# Patient Record
Sex: Male | Born: 1935 | Race: White | Hispanic: No | Marital: Single | Smoking: Former smoker
Health system: Southern US, Community
[De-identification: ages and names within clinical notes are randomized; demographics above are authoritative.]

## PROBLEM LIST (undated history)

## (undated) DIAGNOSIS — N400 Enlarged prostate without lower urinary tract symptoms: Secondary | ICD-10-CM

## (undated) DIAGNOSIS — I10 Essential (primary) hypertension: Secondary | ICD-10-CM

## (undated) DIAGNOSIS — H9192 Unspecified hearing loss, left ear: Secondary | ICD-10-CM

## (undated) DIAGNOSIS — J302 Other seasonal allergic rhinitis: Secondary | ICD-10-CM

## (undated) DIAGNOSIS — E785 Hyperlipidemia, unspecified: Secondary | ICD-10-CM

## (undated) DIAGNOSIS — I6529 Occlusion and stenosis of unspecified carotid artery: Secondary | ICD-10-CM

## (undated) DIAGNOSIS — M1990 Unspecified osteoarthritis, unspecified site: Secondary | ICD-10-CM

## (undated) DIAGNOSIS — I639 Cerebral infarction, unspecified: Secondary | ICD-10-CM

## (undated) HISTORY — DX: Occlusion and stenosis of unspecified carotid artery: I65.29

## (undated) HISTORY — PX: APPENDECTOMY: SHX54

## (undated) HISTORY — DX: Essential (primary) hypertension: I10

## (undated) HISTORY — DX: Hyperlipidemia, unspecified: E78.5

## (undated) HISTORY — DX: Cerebral infarction, unspecified: I63.9

---

## 2003-09-17 ENCOUNTER — Emergency Department (HOSPITAL_COMMUNITY): Admission: AD | Admit: 2003-09-17 | Discharge: 2003-09-17 | Payer: Self-pay | Admitting: Family Medicine

## 2006-10-12 HISTORY — PX: OTHER SURGICAL HISTORY: SHX169

## 2006-10-12 HISTORY — PX: CAROTID ENDARTERECTOMY: SUR193

## 2007-08-25 ENCOUNTER — Ambulatory Visit: Payer: Self-pay | Admitting: *Deleted

## 2007-09-12 ENCOUNTER — Ambulatory Visit: Payer: Self-pay | Admitting: *Deleted

## 2007-09-12 ENCOUNTER — Encounter (INDEPENDENT_AMBULATORY_CARE_PROVIDER_SITE_OTHER): Payer: Self-pay | Admitting: *Deleted

## 2007-09-12 ENCOUNTER — Inpatient Hospital Stay (HOSPITAL_COMMUNITY): Admission: RE | Admit: 2007-09-12 | Discharge: 2007-09-13 | Payer: Self-pay | Admitting: *Deleted

## 2007-10-20 ENCOUNTER — Ambulatory Visit: Payer: Self-pay | Admitting: *Deleted

## 2008-03-22 ENCOUNTER — Ambulatory Visit: Payer: Self-pay | Admitting: *Deleted

## 2009-04-04 ENCOUNTER — Ambulatory Visit: Payer: Self-pay | Admitting: *Deleted

## 2009-09-12 ENCOUNTER — Ambulatory Visit: Payer: Self-pay | Admitting: Vascular Surgery

## 2010-03-21 ENCOUNTER — Ambulatory Visit: Payer: Self-pay | Admitting: Vascular Surgery

## 2010-09-26 ENCOUNTER — Ambulatory Visit: Payer: Self-pay | Admitting: Vascular Surgery

## 2011-01-13 ENCOUNTER — Encounter: Payer: Self-pay | Admitting: Family Medicine

## 2011-01-13 DIAGNOSIS — Z9889 Other specified postprocedural states: Secondary | ICD-10-CM

## 2011-01-13 DIAGNOSIS — IMO0002 Reserved for concepts with insufficient information to code with codable children: Secondary | ICD-10-CM | POA: Insufficient documentation

## 2011-01-13 DIAGNOSIS — I2510 Atherosclerotic heart disease of native coronary artery without angina pectoris: Secondary | ICD-10-CM

## 2011-01-13 DIAGNOSIS — N400 Enlarged prostate without lower urinary tract symptoms: Secondary | ICD-10-CM

## 2011-01-13 DIAGNOSIS — E1165 Type 2 diabetes mellitus with hyperglycemia: Secondary | ICD-10-CM | POA: Insufficient documentation

## 2011-01-13 DIAGNOSIS — IMO0001 Reserved for inherently not codable concepts without codable children: Secondary | ICD-10-CM

## 2011-01-13 DIAGNOSIS — F17201 Nicotine dependence, unspecified, in remission: Secondary | ICD-10-CM

## 2011-01-13 DIAGNOSIS — I10 Essential (primary) hypertension: Secondary | ICD-10-CM

## 2011-02-24 NOTE — Op Note (Signed)
[name], [name]               ACCOUNT NO.:  [id number]   MEDICAL RECORD NO.:  [id number]          PATIENT TYPE:  INP   LOCATION:  [id number]                         FACILITY:  [facility]   PHYSICIAN:  Dorothea Glassman, M.D.    DATE OF BIRTH:  21-May-1931   DATE OF PROCEDURE:  09/12/2007  DATE OF DISCHARGE:                               OPERATIVE REPORT   PREOPERATIVE DIAGNOSIS:  Severe right internal carotid artery stenosis.   POSTOPERATIVE DIAGNOSIS:  Severe right internal carotid artery stenosis.   PROCEDURE:  Right carotid endarterectomy, Dacron patch angioplasty.   SURGEON:  P Cameron Sprang, MD   ASSISTANT:  Jacinta Shoe, P.A.   ANESTHETIC:  General endotracheal.   ANESTHESIOLOGIST:  Dr. Orene Desanctis.   CLINICAL NOTE:  [name] is a 77 year old male referred to the [facility]  office with a right carotid bruit and Doppler revealing severe right  internal carotid artery stenosis.  This was picked up initially on  physical examination by a primary care physician.  The patient was seen  in consultation, recommended he undergo right carotid endarterectomy for  reduction of stroke risk.  The patient consented for surgery.  Brought  to the operating room at this time for right carotid endarterectomy.  Potential risks of the operative procedure explained to the patient in  detail with major morbidity mortality 1-2% to include but not limited to  MI, CVA, cranial nerve injury and death.   OPERATIVE PROCEDURE:  The patient brought to the operating room in  stable condition.  Placed in supine position.  General endotracheal  anesthesia induced.  Foley catheter arterial line in place.  Right neck  prepped and draped in sterile fashion.   Curvilinear skin incision made along the anterior border right  sternomastoid muscle.  Dissection carried down through subcutaneous  tissue with electrocautery.  Deep dissection carried down through the  platysma.  Dissection carried anterior to the  sternocleidomastoid muscle  to expose the carotid bifurcation.  Facial vein ligated with 2-0 silk  and divided.  The common carotid artery mobilized down the omohyoid  muscle and encircled proximally with vessel loop.  The superior thyroid  external carotid were freed and encircled with vessel loops.  The  internal carotid artery followed distally up to the posterior belly of  the digastric muscle and encircled with vessel.   Evaluation of the bifurcation revealed calcified plaque extending into  the origin of the right internal carotid artery.   The tenth and twelfth cranial nerves were identified and preserved.   The patient administered 7000 units heparin intravenously.  The carotid  vessels controlled with clamps.  Longitudinal arteriotomy made in the  distal common carotid artery.  The arteriotomy extended across carotid  bulb and up into the internal carotid artery.  There was a calcified  plaque present in the right carotid bulb extending into the right  internal carotid artery with a high-grade stenosis.  The shunt was  inserted.   An endarterectomy elevator used to remove the plaque.  The  endarterectomy carried down to the common carotid artery where plaque  was divided  transversely with Potts scissors.  Plaque then raised up in  the bulb of the superior thyroid and external carotid were  endarterectomized using an eversion technique.  The distal internal  carotid artery plaque feathered out well.  Fragments of plaque were  removed with fine forceps.  Site irrigated with heparin saline and  dextran solution.   A patch angioplasty endarterectomy site carried out with a Finesse  Dacron patch using running 6-0 Prolene suture.  At completion of the  patch angioplasty, shunt was removed.  All vessels well flushed.  Clamps  removed directing initial antegrade flow up the external carotid artery.  Following this internal carotid was released.  There was an excellent  pulse and  Doppler signal in the distal internal carotid artery.  Adequate hemostasis obtained.  The patient administered 50 mg protamine  intravenously.   Sternomastoid fascia closed running 2-0 Vicryl suture.  Platysma closed  running 3-0 Vicryl suture.  Skin closed 4-0 Monocryl.  Dermabond  applied.   Anesthesia reversed in the operating room.  The patient awakened  readily.  Moved all extremities to command.  Transferred to recovery  room in stable condition.      Dorothea Glassman, M.D.  Electronically Signed     PGH/MEDQ  D:  09/12/2007  T:  09/13/2007  Job:  [id number]

## 2011-02-24 NOTE — Assessment & Plan Note (Signed)
OFFICE VISIT   [name], [name]  DOB:  28-Mar-1931                                       03/21/2010  [id number]   Date of surgery was 09/12/2007 right carotid endarterectomy with Dacron  patch angioplasty closure by Dr. Amedeo Plenty.  The patient is a very pleasant  77 year old gentleman with diabetes, hypertension and  hypercholesterolemia.  He has undergone a previous right carotid  endarterectomy and returns for surveillance.   PAST MEDICAL HISTORY:  As above.   SOCIAL HISTORY:  He is married.  He has four children.  He has a remote  history of tobacco use with discontinuation 20 years ago.   FAMILY HISTORY:  Is significant for cardiac and vascular disease in his  father.   REVIEW OF SYSTEMS:  Was entirely negative.   PHYSICAL FINDINGS:  Revealed a very pleasant elderly slightly obese  gentleman in no apparent distress.  Height was 5 feet 5-1/2 inches, 183  pounds.  Heart rate was 79, blood pressure 179/90, O2 saturation was  95%.  HEENT:  PERRLA, EOMI.  Conjunctivae normal.  Mucous membranes were  pink and moist.  Neck demonstrated a full range of motion.  The trachea  was midline.  I did appreciate a bruit on the right side.  Lungs were  clear to auscultation.  Cardiac exam revealed a regular rate and rhythm.  The abdomen was soft, nontender.  Musculoskeletal exam demonstrated no  major deformities.  Neurological exam was nonfocal.  There were no  ulcers or rashes on the skin.   LABORATORY RESULTS:  He underwent carotid duplex exam today.  He did  have a patent right carotid endarterectomy site with what appeared to be  a significant stenosis of the right distal CCA near the bifurcation  which velocities are unchanged from the previous study performed on  09/12/2009.  There was no hemodynamically significant stenosis in the  left proximal ICA.  No significant changes in velocities were noted in  comparison to the previous exam.   ASSESSMENT AND  PLAN:  The patient is a very pleasant gentleman who has  undergone a right carotid endarterectomy.  He remains stable in his  studies.  We will continue to follow him on a 6 months' basis.   Chad Cordial, PA   Rosetta Posner, M.D.  Electronically Signed   KEL/MEDQ  D:  03/21/2010  T:  03/21/2010  Job:  [id number]

## 2011-02-24 NOTE — Procedures (Signed)
CAROTID DUPLEX EXAM   INDICATION:  Followup carotid artery disease.   HISTORY:  Diabetes:  Yes.  Cardiac:  No.  Hypertension:  Yes.  Smoking:  Previous.  Previous Surgery:  Right CEA 09/12/2007 by Dr. Amedeo Plenty.  CV History:  No.  Amaurosis Fugax No, Paresthesias No, Hemiparesis No                                       RIGHT             LEFT  Brachial systolic pressure:         166               160  Brachial Doppler waveforms:         WNL               WNL  Vertebral direction of flow:        Antegrade         Antegrade  DUPLEX VELOCITIES (cm/sec)  CCA peak systolic                   M=72, D=194       70  ECA peak systolic                   216               [id number]  ICA peak systolic                   248               [id number]  ICA end diastolic                   52                54  PLAQUE MORPHOLOGY:                  Intimal hyperplasia                 Calcific  PLAQUE AMOUNT:                      Moderate          Moderate  PLAQUE LOCATION:                    Proximal patch (distal CCA)         ICA/ECA   IMPRESSION:  1. Right ICA shows no evidence of restenosis, however, velocities are      suggestive of 60-79% stenosis.  2. Right distal CCA at proximal patch shows evidence of intimal      hyperplasia/homogenous plaque with increased velocities of 194      cm/s.  3. Left ICA shows evidence of 40-59% stenosis.  4. Bilateral ECA stenosis.  5. Dr. Amedeo Plenty was informed of the results and stated to put on 6 month      protocol.   ___________________________________________  P. Drucie Opitz, M.D.   AS/MEDQ  D:  04/04/2009  T:  04/04/2009  Job:  [id number]

## 2011-02-24 NOTE — Procedures (Signed)
CAROTID DUPLEX EXAM   INDICATION:  Carotid disease.   HISTORY:  Diabetes:  Yes.  Cardiac:  No.  Hypertension:  Yes.  Smoking:  Previous.  Previous Surgery:  Right carotid endarterectomy on 07/13/2007.  CV History:  Currently asymptomatic.  Amaurosis Fugax No, Paresthesias No, Hemiparesis No                                       RIGHT             LEFT  Brachial systolic pressure:         158               164  Brachial Doppler waveforms:         Normal            Normal  Vertebral direction of flow:        Antegrade         Antegrade  DUPLEX VELOCITIES (cm/sec)  CCA peak systolic                   M=38/D=307        57  ECA peak systolic                   190               [id number]  ICA peak systolic                   285               86  ICA end diastolic                   46                25  PLAQUE MORPHOLOGY:                  Homogeneous       Mixed  PLAQUE AMOUNT:                      Moderate          Mild  PLAQUE LOCATION:                    Distal CCA        ICA / ECA   IMPRESSION:  1. Patent right carotid endarterectomy site with a significant      stenosis of the right distal common carotid artery near the      bifurcation.  2. No hemodynamically significant stenosis of the left proximal      internal carotid artery.  3. No significant change in the velocities when compared to the      previous exam on 09/12/2009.   ___________________________________________  Rosetta Posner, M.D.   CH/MEDQ  D:  03/21/2010  T:  03/21/2010  Job:  [id number]

## 2011-02-24 NOTE — Assessment & Plan Note (Signed)
OFFICE VISIT   Maurice, [name]  DOB:  [birth year]                                       10/20/2007  [id number]   The patient underwent a right carotid endarterectomy for severe stenosis  on 09/12/2007 at [facility].  This was an uneventful operative  procedure.  He was discharged home in good condition.  He has returned  to normal activity now.  No complaints at this time.   BP 161/92, pulse 82 per minute and regular, respirations 18 per minute,  O2 saturation 97%.  Right neck incision is healing unremarkably.  No  carotid bruits audible.  Cranial nerves intact.  Grip strength equal  bilaterally.   The patient is doing quite well following his surgery.  He will remain  on aspirin 81 mg daily.  Plan to follow up with him again in 6 months  and repeat carotid Doppler evaluation.   Dorothea Glassman, M.D.  Electronically Signed   PGH/MEDQ  D:  10/20/2007  T:  10/21/2007  Job:  [id number]   cc:   [name], MD

## 2011-02-24 NOTE — Procedures (Signed)
CAROTID DUPLEX EXAM   INDICATION:  Followup right carotid endarterectomy.   HISTORY:  Diabetes:  No.  Cardiac:  No.  Hypertension:  No.  Smoking:  Previous.  Previous Surgery:  Right carotid endarterectomy on 07/13/2007.  CV History:  Currently asymptomatic.  Amaurosis Fugax No, Paresthesias No, Hemiparesis No                                       RIGHT             LEFT  Brachial systolic pressure:         136               144  Brachial Doppler waveforms:         Normal            Normal  Vertebral direction of flow:        Antegrade         Antegrade  DUPLEX VELOCITIES (cm/sec)  CCA peak systolic                   M=36/D=303        59  ECA peak systolic                   79                0000000  ICA peak systolic                   270               68  ICA end diastolic                   50                20  PLAQUE MORPHOLOGY:                  Homogeneous       Heterogeneous  PLAQUE AMOUNT:                      Moderate          Mild  PLAQUE LOCATION:                    Distal CCA        ICA / ECA / CCA   IMPRESSION:  1. Patent right carotid endarterectomy site with a significant      stenosis of the right distal common carotid artery near the      bifurcation.  Elevated velocities noted in the right proximal      internal carotid artery which appears to be due to the right distal      common carotid artery stenosis.  2. No hemodynamically significant stenosis of the left proximal      internal carotid artery with mild plaque formations as described      above.  3. No significant change in Doppler velocities noted when compared to      the previous exam on 03/21/2010.     ___________________________________________  Rosetta Posner, M.D.   CH/MEDQ  D:  09/26/2010  T:  09/26/2010  Job:  [id number]

## 2011-02-24 NOTE — Discharge Summary (Signed)
NAMESELBY, BRUECK               ACCOUNT NO.:  [id number]   MEDICAL RECORD NO.:  [id number]          PATIENT TYPE:  INP   LOCATION:  [id number]                         FACILITY:  [facility]   PHYSICIAN:  Dorothea Glassman, M.D.    DATE OF BIRTH:  01-16-1931   DATE OF ADMISSION:  09/12/2007  DATE OF DISCHARGE:  09/13/2007                               DISCHARGE SUMMARY   ADMISSION DIAGNOSIS:  Severe right internal carotid artery stenosis,  asymptomatic.   SECONDARY DIAGNOSES:  1. Severe right internal carotid artery stenosis, asymptomatic, status      post right carotid endarterectomy.  2. Mild postoperative hypokalemia, supplemented.  3. Hypertension.  4. Hyperlipidemia.  5. Diabetes mellitus type 2.  6. Benign prostatic hyperplasia.   ALLERGIES:  NO KNOWN DRUG ALLERGIES.   SOCIAL HISTORY:  History of tobacco use but has since quit.   PAST SURGICAL HISTORY:  Appendectomy as a child.   PROCEDURES:  On September 12, 2007, right carotid endarterectomy with  Dacron patch angioplasty by Dr. Dorothea Glassman.   BRIEF HISTORY:  Mr. Barrozo is a 77 year old Caucasian male who was seen  by Dr. Dorothea Glassman for evaluation and management of a normal right  carotid Doppler.  He was noted to have asymptomatic right carotid bruit  by Dr. Margaretmary Eddy. The carotid Doppler evaluation was carried out at  [facility] vascular lab revealing severe right internal carotid artery  stenosis with proximal right internal carotid velocities of 495/210  cm/sec., consistent with severe stenosis.  He had no history of stroke  or TIAs.  Dr. Amedeo Plenty felt due to severity of his carotid artery stenosis,  he should undergo elective right carotid endarterectomy to reduce his  risk for future stroke.   HOSPITAL COURSE:  Mr. Weigandt was elective admitted to [facility] September 12, 2007.  He underwent the previously mentioned  procedure.  He was extubated neurologically intact and after his first  day in the recovery  unit was transferred to the stepdown unit 3300 where  it is anticipated he will remain until discharge.  The morning of  postoperative day 1, vitals were stable.  Did have a low-grade  temperature around 100 overnight which was felt most likely secondary to  atelectasis.  Pulmonary toilet with incentive spirometry was encouraged.  Temperature during morning rounds was down to 99, blood pressure 141/68,  heart rate in the 80s and is sinus rhythm, oxygen saturation 94% on room  air.  Labs showed a white count of 9.1, hemoglobin 13.8, hematocrit  40.2, platelet count 167.  Sodium 138, potassium 3.8 which was  supplemented, BUN of 11, creatinine 1.14 and blood glucose 154.  He is  ambulating and voiding without difficulty.  He denied dysphagia, nausea,  vomiting, and was comfortable.   Physical examination showed his heart was regular rate and rhythm.  Lungs were clear except diminished at the bases.  Abdominal exam was  benign with decreased bowel sounds. Neurologically he was intact.  His  tongue was midline and was moving all extremities times 4.  His neck  incision was clean  and dry without evidence of hematoma.   Diabetes and diabetic and hypertension medications were resumed.  Diet  was advanced.  Currently he is doing well, remains in stable condition,  anticipate that he will be discharged home later this morning or early  afternoon postoperative day 1, September 13, 2007.   DISCHARGE MEDICATIONS:  1. Oxycodone 5 mg 1 to 2 tablets p.o. q.4h. p.r.n. pain.  2. Norvasc 5 mg p.o. daily.  3. Simvastatin 40 mg p.o. daily.  4. Atenolol 50 mg p.o. daily.  5. Lisinopril 40 mg p.o. daily.  6. Januvia 100 mg (reports his family doctor has him on twice daily      dosing).  7. Aspirin 81 mg 2 tablets p.o. daily.   DISCHARGE INSTRUCTIONS:  He should continue diet-appropriate diet, may  shower, clean incision gently with soap and water.  He should call if he  develops fever greater than 101,  redness or draining from his incision  site or changes in his neurological status.  He should avoid driving or  heavy lifting for the next 2 weeks.  Increase his activity as tolerated.  Continue daily walking exercises.  He will follow up with Dr. Amedeo Plenty with  the vascular main specialist office in approximately 2 weeks. Our office  will contact him regarding specific appointment date and time.      Jacinta Shoe, P.A.      Dorothea Glassman, M.D.  Electronically Signed    AWZ/MEDQ  D:  09/13/2007  T:  09/13/2007  Job:  [id number]   cc:   [place] T. Pedro Earls, MD

## 2011-02-24 NOTE — Consult Note (Signed)
VASCULAR SURGERY CONSULTATION   [name], [name]  DOB:  1931-05-29                                       08/25/2007  CHART#:[id number]   REASON FOR CONSULTATION:  Severe right internal carotid artery stenosis.   HISTORY:  The patient is a 77 year old male referred for evaluation and  management of abnormal right carotid Doppler.  He was noted to have an  asymptomatic right carotid bruit by Dr. Margaretmary [name], and carotid Doppler  evaluation carried out at [facility] reveals a severe right  internal carotid artery stenosis.  Proximal right internal carotid  artery reveals velocities of 495/200 cm/sec.  This is consistent with a  severe stenosis.   The patient has no history of stroke.  Denies sensory, motor, visual,  speech or gait abnormalities.  No syncope or presyncope.  No dizziness.   Risk factors for cardiovascular disease include type 2 diabetes,  hypertension, hyperlipidemia.   MEDICATIONS:  1. Norvasc 5 mg daily.  2. Simvastatin 40 mg daily.  3. Atenolol 50 mg daily.  4. Lisinopril 40 mg daily.  5. Januvia 100 mg daily.  6. Aspirin 81 mg daily.   PAST MEDICAL HISTORY:  1. Type 2 diabetes.  2. Hypertension.  3. Hyperlipidemia.  4. BPH.   FAMILY HISTORY:  Mother deceased at age 38 with a history of cancer.  Father deceased at age 60 of complications of COPD.  He has 1 living  sister with a history of osteoarthritis and limited mobility.   SOCIAL HISTORY:  The patient is married, 2 grown children.  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  Does not  smoke.  Discontinued tobacco use about 15 years ago.  No history of  alcohol abuse.   ALLERGIES:  None known.   REVIEW OF SYSTEMS:  (Refer to patient encounter form).  Positive  findings include some loss of appetite recently although no significant  weight loss.  Denies fever or chills.  No chest pain or shortness of  breath.  No cough or sputum production.  No  change in bowel habits,  denies abdominal pain, nausea or vomiting.  No dysuria or frequency.  Denies lower extremity pain.  He does have joint discomfort, muscle  aches.  Denies depression or anxiety.  No changes in vision or hearing.  No bleeding problems.   PHYSICAL EXAMINATION:  GENERALLY:  Well-appearing 77 year old male.  No  acute distress.  No pallor, cyanosis or jaundice.  Alert and oriented.  VITAL SIGNS:  BP 151/85 in the right arm, 143/83 left arm, pulse 73 per  minute, respirations 18 per minute, 02 saturation 98%.  HEENT:  Mouth and throat are clear.  Normocephalic.  Extraocular  movements are intact.  NECK:  Supple, no thyromegaly or adenopathy.  CARDIOVASCULAR:  Right carotid bruit.  No left carotid bruit.  Normal  heart sounds.  Regular rate and rhythm.  No murmurs, rubs, or gallops.  CHEST:  Equal air entry bilaterally.  No rales or rhonchi.  Normal  percussion.  No chest deformity.  ABDOMEN:  Mild to moderate obesity.  No masses or organomegaly.  Nontender.  No guarding or rebound.  Bowel sounds are active, no bruits.  EXTREMITIES:  Normal range of motion in all 4 extremities.  No joint  deformity.  No ankle swelling.  NEUROLOGIC:  The patient is  alert and oriented.  Moves all extremities  to command normally.  Strength equal bilaterally.  Cranial nerves are  intact.  Normal sensation.  Reflexes are 1+.  Speech is normal.  SKIN:  No rashes or ulceration evident.   IMPRESSION:  1. Asymptomatic severe right internal carotid artery stenosis.  2. Type 2 diabetes.  3. Hypertension.  4. Hyperlipidemia.  5. Benign prostatic hypertrophy.   MEDICAL DECISION MAKING:  The patient has a severe right internal  carotid artery stenosis which poses an increased risk of stroke.  I have  recommended right carotid endarterectomy for reduction of stroke risk.  Details of the operative procedure were reviewed with the patient  including major morbidity of 1-2% to include but not  limited to MI, CVA,  cranial nerve injury and death.  Recommended at this time to increase  aspirin to 325 mg daily.  Surgery scheduled for 09/12/2007 at [facility].   Other chronic medical conditions appear well controlled at this time  including hypertension and hyperlipidemia.  No further changes in  management.   Dorothea Glassman, M.D.  Electronically Signed  PGH/MEDQ  D:  08/25/2007  T:  08/26/2007  Job:  [id number]

## 2011-02-24 NOTE — Procedures (Signed)
CAROTID DUPLEX EXAM   INDICATION:  Followup, right carotid endarterectomy.   HISTORY:  Diabetes:  Yes.  Cardiac:  No.  Hypertension:  No.  Smoking:  Previous.  Previous Surgery:  Right carotid endarterectomy on 09/12/07.  CV History:  No.  Amaurosis Fugax No, Paresthesias No, Hemiparesis No                                       RIGHT             LEFT  Brachial systolic pressure:         120               120  Brachial Doppler waveforms:         Normal            Normal  Vertebral direction of flow:        Antegrade         Antegrade  DUPLEX VELOCITIES (cm/sec)  CCA peak systolic                   63                75  ECA peak systolic                   117               [id number]  ICA peak systolic                   69                [id number]  ICA end diastolic                   18                44  PLAQUE MORPHOLOGY:                  None              Heterogenous  PLAQUE AMOUNT:                      None              Mild-to-moderate  PLAQUE LOCATION:                    None              ICA/ECA   IMPRESSION:  1. Patent right carotid endarterectomy site noted with no evidence of      stenosis.  2. Intimal thickening noted in the right distal common carotid artery.  3. 40-59% stenosis of the left internal carotid artery.      ___________________________________________  P. Drucie Opitz, M.D.   CH/MEDQ  D:  03/22/2008  T:  03/22/2008  Job:  [id number]

## 2011-02-24 NOTE — Procedures (Signed)
CAROTID DUPLEX EXAM   INDICATION:  Carotid artery disease.   HISTORY:  Diabetes:  Yes.  Cardiac:  No.  Hypertension:  Yes.  Smoking:  Previous.  Previous Surgery:  Right carotid endarterectomy on 09/12/2007.  CV History:  Currently asymptomatic.  Amaurosis Fugax No, Paresthesias No, Hemiparesis No                                       RIGHT             LEFT  Brachial systolic pressure:         142               154  Brachial Doppler waveforms:         Normal            Normal  Vertebral direction of flow:        Antegrade         Antegrade  DUPLEX VELOCITIES (cm/sec)  CCA peak systolic                   M=40/D=300        58  ECA peak systolic                   170               [id number]  ICA peak systolic                   281               93  ICA end diastolic                   52                27  PLAQUE MORPHOLOGY:                  Intimal hyperplasia versus soft  plaque                              Calcific  PLAQUE AMOUNT:                      Moderate          Mild / moderate  PLAQUE LOCATION:                    Proximal patch / distal CCA         ICA / ECA   IMPRESSION:  1. Doppler velocities suggest a 60%-79% stenosis of the right internal      carotid artery, however, this is most likely a result of the      increased velocities of the right distal common carotid artery.  2. 1%-39% stenosis of the left internal carotid artery.  3. Increased velocity of right distal common carotid artery noted when      compared to the previous exam on 04/04/2009 with the left internal      carotid artery velocities appearing less than previously recorded.   ___________________________________________  Rosetta Posner, M.D.   CH/MEDQ  D:  09/12/2009  T:  09/12/2009  Job:  [id number]

## 2011-07-20 LAB — BASIC METABOLIC PANEL
CO2: 26
Calcium: 8.7
Creatinine, Ser: 1.14
GFR calc Af Amer: >60
GFR calc non Af Amer: >60
Potassium: 3.8
Sodium: 138

## 2011-07-20 LAB — CBC
HCT: 40.2
MCHC: 34.3
MCV: 91
RBC: 4.42
WBC: 9.1

## 2011-07-21 LAB — URINALYSIS, ROUTINE W REFLEX MICROSCOPIC
Bilirubin Urine: NEGATIVE
Glucose, UA: NEGATIVE
Protein, ur: NEGATIVE
Specific Gravity, Urine: 1.015
Urobilinogen, UA: 0.2

## 2011-07-21 LAB — COMPREHENSIVE METABOLIC PANEL
ALT: 19
BUN: 20
Chloride: 104
GFR calc non Af Amer: 58 — ABNORMAL LOW
Potassium: 4.5
Sodium: 137
Total Protein: 6.8

## 2011-07-21 LAB — TYPE AND SCREEN: ABO/RH(D): O NEG

## 2011-07-21 LAB — PROTIME-INR: INR: 1

## 2011-07-21 LAB — CBC
HCT: 44.8
MCHC: 34.3
MCV: 89.7
RDW: 13.6

## 2011-07-21 LAB — ABO/RH: ABO/RH(D): O NEG

## 2012-02-22 ENCOUNTER — Encounter: Payer: Self-pay | Admitting: Vascular Surgery

## 2012-02-23 ENCOUNTER — Encounter: Payer: Self-pay | Admitting: Vascular Surgery

## 2012-02-23 ENCOUNTER — Ambulatory Visit (INDEPENDENT_AMBULATORY_CARE_PROVIDER_SITE_OTHER): Payer: Medicare Other | Admitting: *Deleted

## 2012-02-23 ENCOUNTER — Other Ambulatory Visit: Payer: Self-pay

## 2012-02-23 ENCOUNTER — Other Ambulatory Visit: Payer: Self-pay | Admitting: Vascular Surgery

## 2012-02-23 ENCOUNTER — Ambulatory Visit (INDEPENDENT_AMBULATORY_CARE_PROVIDER_SITE_OTHER): Payer: Medicare Other | Admitting: Vascular Surgery

## 2012-02-23 VITALS — BP 143/84 | HR 87 | Resp 16 | Ht 64.0 in | Wt 185.9 lb

## 2012-02-23 DIAGNOSIS — I6529 Occlusion and stenosis of unspecified carotid artery: Secondary | ICD-10-CM

## 2012-02-23 NOTE — Progress Notes (Signed)
[facility] [place]   Patient name: [name] MRN: [id number] DOB: 1930-11-12 Sex: male   Referred by: Dennard Schaumann   Reason for referral:  Chief Complaint  Patient presents with  . Follow-up    carotid duplex exam 02-23-2012  . Carotid    HISTORY OF PRESENT ILLNESS: The patient is very pleasant 78 year old gentleman with prior history of carotid endarterectomy on the right for severe asymptomatic disease in 2008 with Dr. Amedeo Plenty. He had been followed in our office most recently 2 years ago where he had moderate denies this and the common carotid artery near the prior endarterectomy site. Or so he has remained asymptomatic. He had a recent study an outpatient facility suggesting severe stenosis and is seen today for continued followup of this. Fortunately he denies any neurologic deficits specifically no amaurosis fugax, transient ischemic attack, or stroke. He denies any prior cardiac history. He is a diabetic and does have hypertension. He is a previous smoker.  Past Medical History  Diagnosis Date  . Hypertension   . Hyperlipidemia   . Diabetes mellitus   . Carotid artery occlusion     Past Surgical History  Procedure Date  . Carotidendartectomy 2008    right side  . Appendectomy     History   Social History  . Marital Status: Married    Spouse Name: N/A    Number of Children: N/A  . Years of Education: N/A   Occupational History  . Not on file.   Social History Main Topics  . Smoking status: Former Smoker    Quit date: 10/13/1991  . Smokeless tobacco: Not on file  . Alcohol Use: Yes     occassional glass of wine  . Drug Use: No  . Sexually Active:    Other Topics Concern  . Not on file   Social History Narrative  . No narrative on file    History reviewed. No pertinent family history.  Allergies as of 02/23/2012  . (No Known Allergies)    Current Outpatient Prescriptions on File Prior to Visit  Medication Sig Dispense Refill   . aspirin 325 MG tablet Take 325 mg by mouth daily.        [name] Kitchen atenolol (TENORMIN) 50 MG tablet Take 50 mg by mouth daily.        [name] Kitchen glipiZIDE (GLUCOTROL) 10 MG tablet Take 10 mg by mouth daily.        [name] Kitchen lisinopril (PRINIVIL,ZESTRIL) 20 MG tablet Take 20 mg by mouth daily.        . metFORMIN (GLUCOPHAGE) 1000 MG tablet Take 1,000 mg by mouth 2 (two) times daily with a meal.        . simvastatin (ZOCOR) 20 MG tablet Take 20 mg by mouth at bedtime.        . pioglitazone (ACTOS) 30 MG tablet Take 30 mg by mouth daily.           REVIEW OF SYSTEMS:  Positives indicated with an "X"  CARDIOVASCULAR:  [ ]  chest pain   [ ]  chest pressure   [ ]  palpitations   [ ]  orthopnea   [ ]  dyspnea on exertion   [ ]  claudication   [ ]  rest pain   [ ]  DVT   [ ]  phlebitis PULMONARY:   [ ]  productive cough   [ ]  asthma   [ ]  wheezing NEUROLOGIC:   [ ]  weakness  [ ]  paresthesias  [ ]  aphasia  [ ]   amaurosis  [ ]  dizziness HEMATOLOGIC:   [ ]  bleeding problems   [ ]  clotting disorders MUSCULOSKELETAL:  [ ]  joint pain   [ ]  joint swelling GASTROINTESTINAL: [ ]   blood in stool  [ ]   hematemesis GENITOURINARY:  [ ]   dysuria  [ ]   hematuria PSYCHIATRIC:  [ ]  history of major depression INTEGUMENTARY:  [ ]  rashes  [ ]  ulcers CONSTITUTIONAL:  [ ]  fever   [ ]  chills  PHYSICAL EXAMINATION:  General: The patient is a well-nourished male, in no acute distress. Vital signs are BP 143/84  Pulse 87  Resp 16  Ht 5\' 4"  (1.626 m)  Wt 185 lb 14.4 oz (84.324 kg)  BMI 31.91 kg/m2 Pulmonary: There is a good air exchange bilaterally without wheezing or rales. Abdomen: Soft and non-tender with normal pitch bowel sounds. Musculoskeletal: There are no major deformities.  There is no significant extremity pain. Neurologic: No focal weakness or paresthesias are detected, Skin: There are no ulcer or rashes noted. Psychiatric: The patient has normal affect. Cardiovascular: There is a regular rate and rhythm without significant  murmur appreciated. Carotid artery: Well-healed right carotid incision no carotid bruits bilaterally Pulse status 2+ radial pulses bilaterally  VVS Vascular Lab Studies:  Ordered and Independently Reviewed repeat right carotid duplex reveals severe greater than 80% stenosis at the carotid bifurcation. This appears to have a normal internal carotid artery distal to this.  Impression and Plan:  Severe recurrent right carotid stenosis status post right carotid endarterectomy in 2008. I discussed this at length with the patient and his family present. I have recommended a CT angiogram to confirm his carotid restenosis. Assuming that this is the case we would recommend endarterectomy for reduction of stroke risk. He understands 1-2% risk of stroke with surgery and a very low risk of cranial nerve injury with potential voice or swallowing difficulties. We will obtain a CT scan as an outpatient and discuss this with the patient following this. If this confirms severe recurrent stenosis we would recommend endarterectomy.    Roberta Kelly [facility] [place] Office: [phone]

## 2012-02-26 ENCOUNTER — Other Ambulatory Visit: Payer: Self-pay

## 2012-02-29 ENCOUNTER — Encounter (HOSPITAL_COMMUNITY)
Admission: RE | Admit: 2012-02-29 | Discharge: 2012-02-29 | Disposition: A | Discharge status: Home / Self Care | Payer: Medicare Other | Source: Ambulatory Visit | Attending: Vascular Surgery | Admitting: Vascular Surgery

## 2012-02-29 ENCOUNTER — Encounter (HOSPITAL_COMMUNITY): Payer: Self-pay | Admitting: Pharmacy Technician

## 2012-02-29 ENCOUNTER — Encounter (HOSPITAL_COMMUNITY): Payer: Self-pay

## 2012-02-29 ENCOUNTER — Ambulatory Visit (HOSPITAL_COMMUNITY)
Admission: RE | Admit: 2012-02-29 | Discharge: 2012-02-29 | Disposition: A | Discharge status: Home / Self Care | Payer: Medicare Other | Source: Ambulatory Visit | Attending: Vascular Surgery | Admitting: Vascular Surgery

## 2012-02-29 ENCOUNTER — Ambulatory Visit
Admission: RE | Admit: 2012-02-29 | Discharge: 2012-02-29 | Disposition: A | Discharge status: Home / Self Care | Payer: Medicare Other | Source: Ambulatory Visit | Attending: Vascular Surgery | Admitting: Vascular Surgery

## 2012-02-29 DIAGNOSIS — I6529 Occlusion and stenosis of unspecified carotid artery: Secondary | ICD-10-CM

## 2012-02-29 DIAGNOSIS — Z01818 Encounter for other preprocedural examination: Secondary | ICD-10-CM | POA: Insufficient documentation

## 2012-02-29 DIAGNOSIS — I10 Essential (primary) hypertension: Secondary | ICD-10-CM | POA: Insufficient documentation

## 2012-02-29 DIAGNOSIS — E119 Type 2 diabetes mellitus without complications: Secondary | ICD-10-CM | POA: Insufficient documentation

## 2012-02-29 HISTORY — DX: Unspecified hearing loss, left ear: H91.92

## 2012-02-29 HISTORY — DX: Unspecified osteoarthritis, unspecified site: M19.90

## 2012-02-29 HISTORY — DX: Benign prostatic hyperplasia without lower urinary tract symptoms: N40.0

## 2012-02-29 HISTORY — DX: Other seasonal allergic rhinitis: J30.2

## 2012-02-29 LAB — SURGICAL PCR SCREEN
MRSA, PCR: NEGATIVE
Staphylococcus aureus: NEGATIVE

## 2012-02-29 LAB — COMPREHENSIVE METABOLIC PANEL
AST: 15 U/L (ref 0–37)
Albumin: 4.1 g/dL (ref 3.5–5.2)
Alkaline Phosphatase: 83 U/L (ref 39–117)
BUN: 18 mg/dL (ref 6–23)
CO2: 29 mEq/L (ref 19–32)
Chloride: 100 mEq/L (ref 96–112)
Potassium: 5.5 mEq/L — ABNORMAL HIGH (ref 3.5–5.1)
Total Bilirubin: 0.8 mg/dL (ref 0.3–1.2)

## 2012-02-29 LAB — URINALYSIS, ROUTINE W REFLEX MICROSCOPIC
Glucose, UA: 100 mg/dL — AB
Hgb urine dipstick: NEGATIVE
Specific Gravity, Urine: 1.013 (ref 1.005–1.030)

## 2012-02-29 LAB — CBC
HCT: 47.2 % (ref 39.0–52.0)
RBC: 5.21 MIL/uL (ref 4.22–5.81)
RDW: 13.5 % (ref 11.5–15.5)
WBC: 6.6 10*3/uL (ref 4.0–10.5)

## 2012-02-29 LAB — PROTIME-INR: INR: 0.92 (ref 0.00–1.49)

## 2012-02-29 LAB — TYPE AND SCREEN: ABO/RH(D): O NEG

## 2012-02-29 MED ORDER — IOHEXOL 350 MG/ML SOLN
80.0000 mL | Freq: Once | INTRAVENOUS | Status: AC | PRN
  Administered 2012-02-29: 80 mL via INTRAVENOUS

## 2012-02-29 NOTE — Pre-Procedure Instructions (Signed)
Gideon  02/29/2012   Your procedure is scheduled on:  Fri, May 24 @ 10:45 AM  Report to [place] at 7:30 AM.  Call this number if you have problems the morning of surgery: [phone]   Remember:   Do not eat food:After Midnight.  May have clear liquids: up to 4 Hours before arrival.(until 3:30 am)  Clear liquids include soda, tea, black coffee, apple or grape juice, broth,water  Take these medicines the morning of surgery with A SIP OF WATER: Atenolol   Do not wear jewelry  Do not wear lotions, powders, or clo  Men may shave face and neck.  Do not bring valuables to the hospital.  Contacts, dentures or bridgework may not be worn into surgery.  Leave suitcase in the car. After surgery it may be brought to your room.  For patients admitted to the hospital, checkout time is 11:00 AM the day of discharge.   Special Instructions: CHG Shower Use Special Wash: 1/2 bottle night before surgery and 1/2 bottle morning of surgery.   Please read over the following fact sheets that you were given: Pain Booklet, Coughing and Deep Breathing, Blood Transfusion Information, MRSA Information and Surgical Site Infection Prevention

## 2012-02-29 NOTE — Progress Notes (Signed)
Dr.[name] is medical MD with [facility]  Pt doesn't have a cardiologist  Stress test done @ Dr.[name] > 46yrs ago  Denies ever having a heart cath

## 2012-03-01 ENCOUNTER — Inpatient Hospital Stay (HOSPITAL_COMMUNITY): Admission: RE | Admit: 2012-03-01 | Payer: Medicare Other | Source: Ambulatory Visit

## 2012-03-01 NOTE — Consult Note (Addendum)
Anesthesia Chart Review:  Patient is a 78 year old male scheduled for redo right CEA on 03/04/12.  History includes right CEA '08, HTN, HLD, DM2, former smoker, arthritis, BPH, impaired hearing on the left.  PCP is Dr. Dennard Schaumann with [facility]    CXR on 02/29/12 showed no active disease.  Labs noted.  K 5.5, glucose 170, Cr 1.15.  Coags and CBC WNL.  Will check an ISTAT on arrival to reevaluate his hyperkalemia.  He is on an ACEI, but no diuretic or KCL supplements.  EKG from 02/29/12 showed NSR, LAD, low voltage QRS, cannot rule out anterior infarct (age undetermined).  It was not felt significantly changed from his prior EKG on 09/07/07. No CV symptoms documented at his PAT appointment.  Plan to proceed if remains asymptomatic and follow-up K+ is reasonable.  Myra Gianotti, PA-C

## 2012-03-03 MED ORDER — DEXTROSE 5 % IV SOLN
1.5000 g | INTRAVENOUS | Status: AC
  Administered 2012-03-04: 1.5 g via INTRAVENOUS
  Filled 2012-03-03: qty 1.5

## 2012-03-03 MED ORDER — SODIUM CHLORIDE 0.9 % IV SOLN: INTRAVENOUS | Status: DC

## 2012-03-03 NOTE — Progress Notes (Signed)
Patient notified of new arrival time of 05:30

## 2012-03-04 ENCOUNTER — Encounter (HOSPITAL_COMMUNITY)
Admission: RE | Disposition: A | Discharge status: Home / Self Care | Payer: Self-pay | Source: Ambulatory Visit | Attending: Vascular Surgery

## 2012-03-04 ENCOUNTER — Encounter (HOSPITAL_COMMUNITY): Payer: Self-pay | Admitting: Vascular Surgery

## 2012-03-04 ENCOUNTER — Encounter (HOSPITAL_COMMUNITY): Payer: Self-pay | Admitting: *Deleted

## 2012-03-04 ENCOUNTER — Inpatient Hospital Stay (HOSPITAL_COMMUNITY)
Admission: RE | Admit: 2012-03-04 | Discharge: 2012-03-05 | DRG: 039 | Disposition: A | Discharge status: Home / Self Care | Payer: Medicare Other | Source: Ambulatory Visit | Attending: Vascular Surgery | Admitting: Vascular Surgery

## 2012-03-04 ENCOUNTER — Telehealth: Payer: Self-pay | Admitting: Vascular Surgery

## 2012-03-04 ENCOUNTER — Ambulatory Visit (HOSPITAL_COMMUNITY): Payer: Medicare Other | Admitting: Vascular Surgery

## 2012-03-04 DIAGNOSIS — I6529 Occlusion and stenosis of unspecified carotid artery: Secondary | ICD-10-CM

## 2012-03-04 DIAGNOSIS — E119 Type 2 diabetes mellitus without complications: Secondary | ICD-10-CM | POA: Diagnosis present

## 2012-03-04 DIAGNOSIS — I10 Essential (primary) hypertension: Secondary | ICD-10-CM | POA: Diagnosis present

## 2012-03-04 DIAGNOSIS — E785 Hyperlipidemia, unspecified: Secondary | ICD-10-CM | POA: Diagnosis present

## 2012-03-04 DIAGNOSIS — Z87891 Personal history of nicotine dependence: Secondary | ICD-10-CM

## 2012-03-04 HISTORY — PX: ENDARTERECTOMY: SHX5162

## 2012-03-04 LAB — GLUCOSE, CAPILLARY: Glucose-Capillary: 188 mg/dL — ABNORMAL HIGH (ref 70–99)

## 2012-03-04 LAB — POCT I-STAT 4, (NA,K, GLUC, HGB,HCT): Sodium: 140 mEq/L (ref 135–145)

## 2012-03-04 SURGERY — ENDARTERECTOMY, CAROTID
Anesthesia: General | Site: Neck | Laterality: Right | Wound class: Clean

## 2012-03-04 MED ORDER — DEXTROSE 5 % IV SOLN
1.5000 g | Freq: Two times a day (BID) | INTRAVENOUS | Status: AC
  Administered 2012-03-04 – 2012-03-05 (×2): 1.5 g via INTRAVENOUS
  Filled 2012-03-04 (×2): qty 1.5

## 2012-03-04 MED ORDER — LORAZEPAM 2 MG/ML IJ SOLN
1.0000 mg | Freq: Once | INTRAMUSCULAR | Status: DC | PRN
Start: 2012-03-04 — End: 2012-03-04

## 2012-03-04 MED ORDER — ATENOLOL 50 MG PO TABS
50.0000 mg | ORAL_TABLET | Freq: Every day | ORAL | Status: DC
  Filled 2012-03-04: qty 1

## 2012-03-04 MED ORDER — ONDANSETRON HCL 4 MG/2ML IJ SOLN: 4.0000 mg | Freq: Four times a day (QID) | INTRAMUSCULAR | Status: DC | PRN

## 2012-03-04 MED ORDER — SODIUM CHLORIDE 0.9 % IV SOLN
10.0000 mg | INTRAVENOUS | Status: DC | PRN
  Administered 2012-03-04: 5 ug/min via INTRAVENOUS

## 2012-03-04 MED ORDER — ROCURONIUM BROMIDE 100 MG/10ML IV SOLN
INTRAVENOUS | Status: DC | PRN
  Administered 2012-03-04: 50 mg via INTRAVENOUS

## 2012-03-04 MED ORDER — INSULIN ASPART 100 UNIT/ML ~~LOC~~ SOLN
SUBCUTANEOUS | Status: DC | PRN
  Administered 2012-03-04: 6 [IU] via SUBCUTANEOUS

## 2012-03-04 MED ORDER — LABETALOL HCL 5 MG/ML IV SOLN
INTRAVENOUS | Status: DC | PRN
  Administered 2012-03-04: 10 mg via INTRAVENOUS
  Administered 2012-03-04 (×2): 5 mg via INTRAVENOUS

## 2012-03-04 MED ORDER — MORPHINE SULFATE 2 MG/ML IJ SOLN
2.0000 mg | INTRAMUSCULAR | Status: DC | PRN
  Administered 2012-03-04: 2 mg via INTRAVENOUS

## 2012-03-04 MED ORDER — LABETALOL HCL 5 MG/ML IV SOLN
INTRAVENOUS | Status: AC
  Administered 2012-03-04: 15:00:00
  Filled 2012-03-04: qty 4

## 2012-03-04 MED ORDER — NITROGLYCERIN IN D5W 200-5 MCG/ML-% IV SOLN
INTRAVENOUS | Status: AC
  Filled 2012-03-04: qty 250

## 2012-03-04 MED ORDER — SUFENTANIL CITRATE 50 MCG/ML IV SOLN
INTRAVENOUS | Status: DC | PRN
  Administered 2012-03-04: 10 ug via INTRAVENOUS
  Administered 2012-03-04: 5 ug via INTRAVENOUS
  Administered 2012-03-04: 10 ug via INTRAVENOUS
  Administered 2012-03-04: 5 ug via INTRAVENOUS

## 2012-03-04 MED ORDER — HYDRALAZINE HCL 20 MG/ML IJ SOLN
10.0000 mg | INTRAMUSCULAR | Status: DC | PRN
  Filled 2012-03-04: qty 1

## 2012-03-04 MED ORDER — LISINOPRIL 20 MG PO TABS
20.0000 mg | ORAL_TABLET | Freq: Every day | ORAL | Status: DC
  Administered 2012-03-05: 20 mg via ORAL
  Filled 2012-03-04: qty 1

## 2012-03-04 MED ORDER — ACETAMINOPHEN 650 MG RE SUPP: 325.0000 mg | RECTAL | Status: DC | PRN

## 2012-03-04 MED ORDER — PROPOFOL 10 MG/ML IV EMUL
INTRAVENOUS | Status: DC | PRN
  Administered 2012-03-04: 160 mg via INTRAVENOUS

## 2012-03-04 MED ORDER — ONDANSETRON HCL 4 MG/2ML IJ SOLN
INTRAMUSCULAR | Status: DC | PRN
  Administered 2012-03-04: 4 mg via INTRAVENOUS

## 2012-03-04 MED ORDER — LACTATED RINGERS IV SOLN
INTRAVENOUS | Status: DC | PRN
  Administered 2012-03-04 (×2): via INTRAVENOUS

## 2012-03-04 MED ORDER — LABETALOL HCL 5 MG/ML IV SOLN
10.0000 mg | INTRAVENOUS | Status: DC | PRN
  Administered 2012-03-05: 10 mg via INTRAVENOUS
  Filled 2012-03-04: qty 4

## 2012-03-04 MED ORDER — BIOTENE DRY MOUTH MT LIQD
15.0000 mL | Freq: Two times a day (BID) | OROMUCOSAL | Status: DC
  Administered 2012-03-04 – 2012-03-05 (×2): 15 mL via OROMUCOSAL

## 2012-03-04 MED ORDER — PROTAMINE SULFATE 10 MG/ML IV SOLN
INTRAVENOUS | Status: DC | PRN
  Administered 2012-03-04: 50 mg via INTRAVENOUS

## 2012-03-04 MED ORDER — SODIUM CHLORIDE 0.9 % IR SOLN
Status: DC | PRN
  Administered 2012-03-04: 09:00:00

## 2012-03-04 MED ORDER — METFORMIN HCL 500 MG PO TABS
1000.0000 mg | ORAL_TABLET | Freq: Two times a day (BID) | ORAL | Status: DC
  Administered 2012-03-05: 1000 mg via ORAL
  Filled 2012-03-04 (×3): qty 2

## 2012-03-04 MED ORDER — DOPAMINE-DEXTROSE 3.2-5 MG/ML-% IV SOLN: 3.0000 ug/kg/min | INTRAVENOUS | Status: DC | PRN

## 2012-03-04 MED ORDER — LINAGLIPTIN 5 MG PO TABS
5.0000 mg | ORAL_TABLET | Freq: Every day | ORAL | Status: DC
  Administered 2012-03-05: 5 mg via ORAL
  Filled 2012-03-04: qty 1

## 2012-03-04 MED ORDER — LIDOCAINE HCL (CARDIAC) 20 MG/ML IV SOLN
INTRAVENOUS | Status: DC | PRN
  Administered 2012-03-04: 60 mg via INTRAVENOUS

## 2012-03-04 MED ORDER — LIDOCAINE HCL 4 % MT SOLN
OROMUCOSAL | Status: DC | PRN
  Administered 2012-03-04: 4 mL via TOPICAL

## 2012-03-04 MED ORDER — MIDAZOLAM HCL 2 MG/2ML IJ SOLN: 1.0000 mg | INTRAMUSCULAR | Status: DC | PRN

## 2012-03-04 MED ORDER — NEOSTIGMINE METHYLSULFATE 1 MG/ML IJ SOLN
INTRAMUSCULAR | Status: DC | PRN
  Administered 2012-03-04: 4 mg via INTRAVENOUS

## 2012-03-04 MED ORDER — OXYCODONE-ACETAMINOPHEN 5-325 MG PO TABS: 1.0000 | ORAL_TABLET | ORAL | Status: AC | PRN

## 2012-03-04 MED ORDER — PHENOL 1.4 % MT LIQD: 1.0000 | OROMUCOSAL | Status: DC | PRN

## 2012-03-04 MED ORDER — GUAIFENESIN-DM 100-10 MG/5ML PO SYRP: 15.0000 mL | ORAL_SOLUTION | ORAL | Status: DC | PRN

## 2012-03-04 MED ORDER — EPHEDRINE SULFATE 50 MG/ML IJ SOLN
INTRAMUSCULAR | Status: DC | PRN
  Administered 2012-03-04 (×2): 10 mg via INTRAVENOUS

## 2012-03-04 MED ORDER — 0.9 % SODIUM CHLORIDE (POUR BTL) OPTIME
TOPICAL | Status: DC | PRN
  Administered 2012-03-04: 1000 mL

## 2012-03-04 MED ORDER — FENTANYL CITRATE 0.05 MG/ML IJ SOLN: 50.0000 ug | INTRAMUSCULAR | Status: DC | PRN

## 2012-03-04 MED ORDER — DOCUSATE SODIUM 100 MG PO CAPS
100.0000 mg | ORAL_CAPSULE | Freq: Every day | ORAL | Status: DC
  Administered 2012-03-05: 100 mg via ORAL
  Filled 2012-03-04: qty 1

## 2012-03-04 MED ORDER — PANTOPRAZOLE SODIUM 40 MG PO TBEC
40.0000 mg | DELAYED_RELEASE_TABLET | Freq: Every day | ORAL | Status: DC
  Administered 2012-03-04: 40 mg via ORAL
  Filled 2012-03-04: qty 1

## 2012-03-04 MED ORDER — SIMVASTATIN 20 MG PO TABS
20.0000 mg | ORAL_TABLET | Freq: Every day | ORAL | Status: DC
  Administered 2012-03-04: 20 mg via ORAL
  Filled 2012-03-04 (×2): qty 1

## 2012-03-04 MED ORDER — OXYCODONE-ACETAMINOPHEN 5-325 MG PO TABS: 1.0000 | ORAL_TABLET | ORAL | Status: DC | PRN

## 2012-03-04 MED ORDER — HYDROMORPHONE HCL PF 1 MG/ML IJ SOLN: 0.2500 mg | INTRAMUSCULAR | Status: DC | PRN

## 2012-03-04 MED ORDER — GLYCOPYRROLATE 0.2 MG/ML IJ SOLN
INTRAMUSCULAR | Status: DC | PRN
  Administered 2012-03-04: .6 mg via INTRAVENOUS
  Administered 2012-03-04: 0.2 mg via INTRAVENOUS

## 2012-03-04 MED ORDER — ACETAMINOPHEN 325 MG PO TABS: 325.0000 mg | ORAL_TABLET | ORAL | Status: DC | PRN

## 2012-03-04 MED ORDER — POTASSIUM CHLORIDE CRYS ER 20 MEQ PO TBCR: 20.0000 meq | EXTENDED_RELEASE_TABLET | Freq: Once | ORAL | Status: AC | PRN

## 2012-03-04 MED ORDER — NITROGLYCERIN IN D5W 200-5 MCG/ML-% IV SOLN
2.0000 ug/min | INTRAVENOUS | Status: DC
  Administered 2012-03-04: 5 ug/min via INTRAVENOUS

## 2012-03-04 MED ORDER — ALUM & MAG HYDROXIDE-SIMETH 200-200-20 MG/5ML PO SUSP: 15.0000 mL | ORAL | Status: DC | PRN

## 2012-03-04 MED ORDER — SODIUM CHLORIDE 0.9 % IV SOLN: 500.0000 mL | Freq: Once | INTRAVENOUS | Status: AC | PRN

## 2012-03-04 MED ORDER — GLIPIZIDE ER 10 MG PO TB24
10.0000 mg | ORAL_TABLET | Freq: Every day | ORAL | Status: DC
  Administered 2012-03-05: 10 mg via ORAL
  Filled 2012-03-04: qty 1

## 2012-03-04 MED ORDER — ASPIRIN EC 81 MG PO TBEC
81.0000 mg | DELAYED_RELEASE_TABLET | Freq: Every day | ORAL | Status: DC
  Administered 2012-03-05: 81 mg via ORAL
  Filled 2012-03-04: qty 1

## 2012-03-04 MED ORDER — HEPARIN SODIUM (PORCINE) 1000 UNIT/ML IJ SOLN
INTRAMUSCULAR | Status: DC | PRN
  Administered 2012-03-04: 8000 [IU] via INTRAVENOUS

## 2012-03-04 MED ORDER — SODIUM CHLORIDE 0.9 % IV SOLN
INTRAVENOUS | Status: DC
  Administered 2012-03-04: 17:00:00 via INTRAVENOUS

## 2012-03-04 MED ORDER — METOPROLOL TARTRATE 1 MG/ML IV SOLN: 2.0000 mg | INTRAVENOUS | Status: DC | PRN

## 2012-03-04 MED ORDER — NITROPRUSSIDE SODIUM 25 MG/ML IV SOLN
0.2500 ug/kg/min | INTRAVENOUS | Status: DC | PRN
  Filled 2012-03-04: qty 2

## 2012-03-04 SURGICAL SUPPLY — 47 items
APL SKNCLS STERI-STRIP NONHPOA (GAUZE/BANDAGES/DRESSINGS) ×1
BENZOIN TINCTURE PRP APPL 2/3 (GAUZE/BANDAGES/DRESSINGS) ×2 IMPLANT
CANISTER SUCTION 2500CC (MISCELLANEOUS) ×2 IMPLANT
CATH ROBINSON RED A/P 18FR (CATHETERS) ×2 IMPLANT
CLIP LIGATING EXTRA MED SLVR (CLIP) ×2 IMPLANT
CLIP LIGATING EXTRA SM BLUE (MISCELLANEOUS) ×2 IMPLANT
CLOTH BEACON ORANGE TIMEOUT ST (SAFETY) ×2 IMPLANT
CLSR STERI-STRIP ANTIMIC 1/2X4 (GAUZE/BANDAGES/DRESSINGS) ×2 IMPLANT
COVER SURGICAL LIGHT HANDLE (MISCELLANEOUS) ×4 IMPLANT
CRADLE DONUT ADULT HEAD (MISCELLANEOUS) ×2 IMPLANT
DECANTER SPIKE VIAL GLASS SM (MISCELLANEOUS) IMPLANT
DRAIN HEMOVAC 1/8 X 5 (WOUND CARE) IMPLANT
DRAPE WARM FLUID 44X44 (DRAPE) ×2 IMPLANT
DRSG COVADERM 4X8 (GAUZE/BANDAGES/DRESSINGS) ×2 IMPLANT
ELECT REM PT RETURN 9FT ADLT (ELECTROSURGICAL) ×2
ELECTRODE REM PT RTRN 9FT ADLT (ELECTROSURGICAL) ×1 IMPLANT
EVACUATOR SILICONE 100CC (DRAIN) IMPLANT
GEL ULTRASOUND 20GR AQUASONIC (MISCELLANEOUS) IMPLANT
GLOVE BIO SURGEON STRL SZ 6.5 (GLOVE) ×6 IMPLANT
GLOVE BIOGEL PI IND STRL 6.5 (GLOVE) ×5 IMPLANT
GLOVE BIOGEL PI INDICATOR 6.5 (GLOVE) ×5
GLOVE SS BIOGEL STRL SZ 7.5 (GLOVE) ×1 IMPLANT
GLOVE SUPERSENSE BIOGEL SZ 7.5 (GLOVE) ×1
GOWN STRL NON-REIN LRG LVL3 (GOWN DISPOSABLE) IMPLANT
KIT BASIN OR (CUSTOM PROCEDURE TRAY) ×2 IMPLANT
KIT ROOM TURNOVER OR (KITS) ×2 IMPLANT
NEEDLE 22X1 1/2 (OR ONLY) (NEEDLE) IMPLANT
NS IRRIG 1000ML POUR BTL (IV SOLUTION) ×4 IMPLANT
PACK CAROTID (CUSTOM PROCEDURE TRAY) ×2 IMPLANT
PAD ARMBOARD 7.5X6 YLW CONV (MISCELLANEOUS) ×4 IMPLANT
PATCH HEMASHIELD 8X75 (Vascular Products) ×2 IMPLANT
SHUNT CAROTID BYPASS 10 (VASCULAR PRODUCTS) ×2 IMPLANT
SHUNT CAROTID BYPASS 12FRX15.5 (VASCULAR PRODUCTS) IMPLANT
SPECIMEN JAR SMALL (MISCELLANEOUS) ×2 IMPLANT
STRIP CLOSURE SKIN 1/2X4 (GAUZE/BANDAGES/DRESSINGS) ×2 IMPLANT
SUT ETHILON 3 0 PS 1 (SUTURE) IMPLANT
SUT PROLENE 5 0 C 1 24 (SUTURE) ×2 IMPLANT
SUT PROLENE 5 0 C1 (SUTURE) ×2 IMPLANT
SUT PROLENE 6 0 CC (SUTURE) ×6 IMPLANT
SUT VIC AB 3-0 SH 27 (SUTURE) ×4
SUT VIC AB 3-0 SH 27X BRD (SUTURE) ×2 IMPLANT
SUT VICRYL 4-0 PS2 18IN ABS (SUTURE) ×2 IMPLANT
SYR CONTROL 10ML LL (SYRINGE) IMPLANT
TOWEL OR 17X24 6PK STRL BLUE (TOWEL DISPOSABLE) IMPLANT
TOWEL OR 17X26 10 PK STRL BLUE (TOWEL DISPOSABLE) IMPLANT
TRAY FOLEY CATH 14FRSI W/METER (CATHETERS) ×2 IMPLANT
WATER STERILE IRR 1000ML POUR (IV SOLUTION) ×2 IMPLANT

## 2012-03-04 NOTE — Telephone Encounter (Signed)
Message copied by Gena Fray on Fri Mar 04, 2012  2:43 PM ------      Message from: Alfonso Patten      Created: Fri Mar 04, 2012 11:37 AM                   ----- Message -----         From: Richrd Prime, PA         Sent: 03/04/2012  10:53 AM           To: Milus Mallick, RN            03/04/12 Right redo carotid - Early/Dickson/Roczniak            F/U 2 weeks - Early

## 2012-03-04 NOTE — Progress Notes (Signed)
SBP 115.  Turned off nitro gtt.  Will continue to monitor BP. Marlowe Kays RN

## 2012-03-04 NOTE — Anesthesia Preprocedure Evaluation (Addendum)
Anesthesia Evaluation  Patient identified by MRN, date of birth, ID band Patient awake    Reviewed: Allergy & Precautions, H&P , NPO status , Patient's Chart, lab work & pertinent test results  Airway Mallampati: II TM Distance: >3 FB Neck ROM: Full    Dental   Pulmonary    Pulmonary exam normal       Cardiovascular hypertension,     Neuro/Psych    GI/Hepatic   Endo/Other  Diabetes mellitus-  Renal/GU      Musculoskeletal   Abdominal (+) + obese,   Peds  Hematology   Anesthesia Other Findings   Reproductive/Obstetrics                           Anesthesia Physical Anesthesia Plan  ASA: III  Anesthesia Plan: General   Post-op Pain Management:    Induction: Intravenous  Airway Management Planned: Oral ETT  Additional Equipment: Arterial line  Intra-op Plan:   Post-operative Plan: Extubation in OR  Informed Consent: I have reviewed the patients History and Physical, chart, labs and discussed the procedure including the risks, benefits and alternatives for the proposed anesthesia with the patient or authorized representative who has indicated his/her understanding and acceptance.   Dental advisory given  Plan Discussed with: CRNA, Surgeon and Anesthesiologist  Anesthesia Plan Comments:        Anesthesia Quick Evaluation

## 2012-03-04 NOTE — Transfer of Care (Signed)
Immediate Anesthesia Transfer of Care Note  Patient: [name]  Procedure(s) Performed: Procedure(s) (LRB): ENDARTERECTOMY CAROTID (Right)  Patient Location: PACU  Anesthesia Type: General  Level of Consciousness: awake, alert , oriented and patient cooperative  Airway & Oxygen Therapy: Patient Spontanous Breathing and Patient connected to face mask oxygen  Post-op Assessment: Report given to PACU RN  Post vital signs: Reviewed and stable  Complications: No apparent anesthesia complications

## 2012-03-04 NOTE — Progress Notes (Signed)
Pt continues to be hypertensive. Also, moderate dng noted to right neck dsg. Regina PA notified--orders received not to treat BP at this time and to change neck dsg. Will carry out orders and cont to monitor.

## 2012-03-04 NOTE — Interval H&P Note (Signed)
History and Physical Interval Note:  03/04/2012 7:21 AM  [name]  has presented today for surgery, with the diagnosis of Right ICA Stenosis  The various methods of treatment have been discussed with the patient and family. After consideration of risks, benefits and other options for treatment, the patient has consented to  Procedure(s) (LRB): ENDARTERECTOMY CAROTID (Right) as a surgical intervention .  The patients' history has been reviewed, patient examined, no change in status, stable for surgery.  I have reviewed the patients' chart and labs.  Questions were answered to the patient's satisfaction.     [name]

## 2012-03-04 NOTE — Telephone Encounter (Signed)
Left voicemail for patient and sent letter regarding follow up from surgery on 03/15/12 @ 1045 with TFE, dpm

## 2012-03-04 NOTE — Procedures (Unsigned)
CAROTID DUPLEX EXAM  INDICATION:  Followup right carotid endarterectomy  HISTORY: Diabetes:  Yes Cardiac:  No Hypertension:  Yes Smoking:  Previously Previous Surgery:  Right carotid endarterectomy 07/13/2007 CV History:  Asymptomatic at this time Amaurosis Fugax Yes No, Paresthesias Yes No, Hemiparesis Yes No                                      RIGHT             LEFT Brachial systolic pressure:         134               44 Brachial Doppler waveforms:         Triphasic         Triphasic Vertebral direction of flow:        Antegrade DUPLEX VELOCITIES (cm/sec) CCA peak systolic                   56 ECA peak systolic                   [id number] ICA peak systolic                   [id number] ICA end diastolic                   90 PLAQUE MORPHOLOGY:                  Heterogeneous PLAQUE AMOUNT:                      Moderate to severe PLAQUE LOCATION:                    Bifurcation, ICA and ECA  BIFURCATION:  RIGHT 356/132  IMPRESSION: 1. 60%-79% right internal carotid artery upper end of range. 2. Velocities in the right bifurcation would be in the 80%-99% range. 3. Right vertebral artery flow is antegrade.      ___________________________________________ Rosetta Posner, M.D.  SS/MEDQ  D:  02/23/2012  T:  02/23/2012  Job:  [id number]

## 2012-03-04 NOTE — Progress Notes (Addendum)
Pt transferred from PACU by Casey Burkitt.  Pt alert and oriented.  Pt came to unit from PACU with Elevated systolic and diastolic blood pressure ranging in the 190's via manual cuff and over 200's via the A line.[name] Kitchen  PA [facility] visited patient and ordered Nitroglycerin drip for patient.  Patient is asymptomatic and is resting comfortably.  Will continue to monitor.

## 2012-03-04 NOTE — Op Note (Addendum)
OPERATIVE REPORT  DATE OF SURGERY: 03/04/2012  PATIENT: [name], 78 y.o. male MRN: [id number]  DOB: Mar 29, 1931  PRE-OPERATIVE DIAGNOSIS: Recurrent right carotid stenosis  POST-OPERATIVE DIAGNOSIS:  Same  PROCEDURE: Redo right carotid endarterectomy and Dacron patch angioplasty  SURGEON:  Curt Jews, M.D.   ASSISTANTKelby Fam, Dr Scot Dock  ANESTHESIA:  Gen.  EBL: 200 ml  Total I/O In: 1000 [I.V.:1000] Out: 1000 [Urine:800; Blood:200]  BLOOD ADMINISTERED: None  DRAINS: None  SPECIMEN: Carotid plaque  COUNTS CORRECT:  YES  PLAN OF CARE: PACU neurologically intact   PATIENT DISPOSITION:  PACU - hemodynamically stable  PROCEDURE DETAILS: The patient was taken to the operating room placed supine position where the area of the right neck was prepped and draped in usual sterile fashion. An incision was made through the prior scar through the old carotid endarterectomy incision carried down through the platysma electrocautery. The sternocleidomastoid reflected posteriorly. The patient had extensive scarring throughout the old site. The vagus nerve was identified and preserved. This was mobilized off the carotid. The common carotid artery was encircled with a vessel loop below the level of the bifurcation and below the level of the prior patch. The internal carotid artery was identified distal to the prior patch. The superior thyroid artery was involved in the scarring and was oversewn at its origin with a 6-0 Prolene suture and divided. The patient was given 8000 units of intravenous heparin and after adequate circulation time the internal/external and common carotid arteries were occluded. The common carotid artery was opened 11 blade extended longitudinally on through the patch onto the internal carotid above the prior endarterectomy site. The shunt was passed up the internal carotid up to backbleed then down the common carotid were secured with a male current tourniquet. The old  patch was removed in its entirety. Been kinked upon itself causing stenosis at the origin. The new Finesse Hemashield Dacron patch onto the field and was sewn as a patch angioplasty with a running 5-0 Prolene suture. Prior to completion of the anastomosis the shunt was removed in the usual flushing maneuvers were undertaken anastomosis was completed and flow was assured first to the external and the internal carotid artery. The patient was given 50 mg of protamine to reverse heparin. Wounds were closed with 3-0 Vicryl to reapproximate the sternocleidomastoid over the carotid sheath. The distals) 3-0 Vicryl suture and the skin was closed with 4 subcuticular Vicryl suture. Sterile dressing was applied and the patient was taken to the recovery in stable condition and neurologically intact.   Curt Jews, M.D. 03/04/2012 11:03 AM

## 2012-03-04 NOTE — H&P (View-Only) (Signed)
[facility] [place]   Patient name: [name] MRN: [id number] DOB: Jul 13, 1931 Sex: male   Referred by: Dennard Schaumann   Reason for referral:  Chief Complaint  Patient presents with  . Follow-up    carotid duplex exam 02-23-2012  . Carotid    HISTORY OF PRESENT ILLNESS: The patient is very pleasant 78 year old gentleman with prior history of carotid endarterectomy on the right for severe asymptomatic disease in 2008 with Dr. Amedeo Plenty. He had been followed in our office most recently 2 years ago where he had moderate denies this and the common carotid artery near the prior endarterectomy site. Or so he has remained asymptomatic. He had a recent study an outpatient facility suggesting severe stenosis and is seen today for continued followup of this. Fortunately he denies any neurologic deficits specifically no amaurosis fugax, transient ischemic attack, or stroke. He denies any prior cardiac history. He is a diabetic and does have hypertension. He is a previous smoker.  Past Medical History  Diagnosis Date  . Hypertension   . Hyperlipidemia   . Diabetes mellitus   . Carotid artery occlusion     Past Surgical History  Procedure Date  . Carotidendartectomy 2008    right side  . Appendectomy     History   Social History  . Marital Status: Married    Spouse Name: N/A    Number of Children: N/A  . Years of Education: N/A   Occupational History  . Not on file.   Social History Main Topics  . Smoking status: Former Smoker    Quit date: 10/13/1991  . Smokeless tobacco: Not on file  . Alcohol Use: Yes     occassional glass of wine  . Drug Use: No  . Sexually Active:    Other Topics Concern  . Not on file   Social History Narrative  . No narrative on file    History reviewed. No pertinent family history.  Allergies as of 02/23/2012  . (No Known Allergies)    Current Outpatient Prescriptions on File Prior to Visit  Medication Sig Dispense Refill   . aspirin 325 MG tablet Take 325 mg by mouth daily.        [name] Kitchen atenolol (TENORMIN) 50 MG tablet Take 50 mg by mouth daily.        [name] Kitchen glipiZIDE (GLUCOTROL) 10 MG tablet Take 10 mg by mouth daily.        [name] Kitchen lisinopril (PRINIVIL,ZESTRIL) 20 MG tablet Take 20 mg by mouth daily.        . metFORMIN (GLUCOPHAGE) 1000 MG tablet Take 1,000 mg by mouth 2 (two) times daily with a meal.        . simvastatin (ZOCOR) 20 MG tablet Take 20 mg by mouth at bedtime.        . pioglitazone (ACTOS) 30 MG tablet Take 30 mg by mouth daily.           REVIEW OF SYSTEMS:  Positives indicated with an "X"  CARDIOVASCULAR:  [ ]  chest pain   [ ]  chest pressure   [ ]  palpitations   [ ]  orthopnea   [ ]  dyspnea on exertion   [ ]  claudication   [ ]  rest pain   [ ]  DVT   [ ]  phlebitis PULMONARY:   [ ]  productive cough   [ ]  asthma   [ ]  wheezing NEUROLOGIC:   [ ]  weakness  [ ]  paresthesias  [ ]  aphasia  [ ]   amaurosis  [ ]  dizziness HEMATOLOGIC:   [ ]  bleeding problems   [ ]  clotting disorders MUSCULOSKELETAL:  [ ]  joint pain   [ ]  joint swelling GASTROINTESTINAL: [ ]   blood in stool  [ ]   hematemesis GENITOURINARY:  [ ]   dysuria  [ ]   hematuria PSYCHIATRIC:  [ ]  history of major depression INTEGUMENTARY:  [ ]  rashes  [ ]  ulcers CONSTITUTIONAL:  [ ]  fever   [ ]  chills  PHYSICAL EXAMINATION:  General: The patient is a well-nourished male, in no acute distress. Vital signs are BP 143/84  Pulse 87  Resp 16  Ht 5\' 4"  (1.626 m)  Wt 185 lb 14.4 oz (84.324 kg)  BMI 31.91 kg/m2 Pulmonary: There is a good air exchange bilaterally without wheezing or rales. Abdomen: Soft and non-tender with normal pitch bowel sounds. Musculoskeletal: There are no major deformities.  There is no significant extremity pain. Neurologic: No focal weakness or paresthesias are detected, Skin: There are no ulcer or rashes noted. Psychiatric: The patient has normal affect. Cardiovascular: There is a regular rate and rhythm without significant  murmur appreciated. Carotid artery: Well-healed right carotid incision no carotid bruits bilaterally Pulse status 2+ radial pulses bilaterally  VVS Vascular Lab Studies:  Ordered and Independently Reviewed repeat right carotid duplex reveals severe greater than 80% stenosis at the carotid bifurcation. This appears to have a normal internal carotid artery distal to this.  Impression and Plan:  Severe recurrent right carotid stenosis status post right carotid endarterectomy in 2008. I discussed this at length with the patient and his family present. I have recommended a CT angiogram to confirm his carotid restenosis. Assuming that this is the case we would recommend endarterectomy for reduction of stroke risk. He understands 1-2% risk of stroke with surgery and a very low risk of cranial nerve injury with potential voice or swallowing difficulties. We will obtain a CT scan as an outpatient and discuss this with the patient following this. If this confirms severe recurrent stenosis we would recommend endarterectomy.    Arienna Benegas [facility] [place] Office: [phone]

## 2012-03-04 NOTE — Anesthesia Postprocedure Evaluation (Signed)
  Anesthesia Post-op Note  Patient: [name]  Procedure(s) Performed: Procedure(s) (LRB): ENDARTERECTOMY CAROTID (Right)  Patient Location: PACU  Anesthesia Type: General  Level of Consciousness: awake  Airway and Oxygen Therapy: Patient Spontanous Breathing  Post-op Pain: mild  Post-op Assessment: Post-op Vital signs reviewed, Patient's Cardiovascular Status Stable, Respiratory Function Stable, Patent Airway, No signs of Nausea or vomiting and Pain level controlled  Post-op Vital Signs: stable  Complications: No apparent anesthesia complications

## 2012-03-04 NOTE — OR Nursing (Signed)
720 am - preoperative neuro check, handgrips strong and equal, moves all 4 extremities, tongue midline 10:53 same as preop.

## 2012-03-04 NOTE — Telephone Encounter (Signed)
lvm with pt re: surgical f/u on 03/15/12 at 1045am

## 2012-03-04 NOTE — Telephone Encounter (Signed)
Message copied by Gena Fray on Fri Mar 04, 2012 12:40 PM ------      Message from: Alfonso Patten      Created: Fri Mar 04, 2012 11:37 AM                   ----- Message -----         From: Richrd Prime, PA         Sent: 03/04/2012  10:53 AM           To: Milus Mallick, RN            03/04/12 Right redo carotid - Early/Dickson/Roczniak            F/U 2 weeks - Early

## 2012-03-04 NOTE — Discharge Summary (Signed)
[facility] Discharge Summary   Patient ID:  [name] MRN: [id number] DOB/AGE: 76/04/1931 76 y.o.  Admit date: 03/04/2012 Discharge date: 03/04/2012 Date of Surgery: 03/04/2012 Surgeon: Surgeon(s): Rosetta Posner, MD Angelia Mould, MD  Admission Diagnosis: Right ICA Stenosis  Discharge Diagnoses:  Right ICA Stenosis  Secondary Diagnoses: Past Medical History  Diagnosis Date  . Hyperlipidemia     takes Simvastatin daily  . Carotid artery occlusion   . Hypertension     takes Atenolol and Lisinopril daily  . Seasonal allergies   . Arthritis     knee  . Enlarged prostate     pt states no trouble with it  . Diabetes mellitus     metformin,januvia,and glipizide daily  . Impaired hearing, left     but doesn't wear hearing aids    Procedure(s): ENDARTERECTOMY CAROTID  Discharged Condition: good  HPI:  [name] is a 78 y.o. male  Hospital Course:  [name] is a 78 y.o. male is S/P redo Right ENDARTERECTOMY CAROTID Extubated: POD # 0 Post-op wounds healing well Pt. Ambulating, voiding and taking PO diet without difficulty. Pt pain controlled with PO pain meds. Labs as below Complications:none  Consults:     Significant Diagnostic Studies: CBC Lab Results  Component Value Date   WBC 6.6 02/29/2012   HGB 15.6 03/04/2012   HCT 46.0 03/04/2012   MCV 90.6 02/29/2012   PLT 158 02/29/2012    BMET    Component Value Date/Time   NA 140 03/04/2012 0609   K 5.2* 03/04/2012 0609   CL 100 02/29/2012 1514   CO2 29 02/29/2012 1514   GLUCOSE 251* 03/04/2012 0609   BUN 18 02/29/2012 1514   CREATININE 1.15 02/29/2012 1514   CREATININE 1.50* 02/23/2012 1753   CALCIUM 10.0 02/29/2012 1514   GFRNONAA 59* 02/29/2012 1514   GFRAA 68* 02/29/2012 1514   COAG Lab Results  Component Value Date   INR 0.92 02/29/2012   INR 1.0 09/07/2007     Disposition:  Discharge to :Home Discharge Orders    Future Orders Please Complete By Expires   Resume previous diet      Driving Restrictions      Comments:   No driving for 2 weeks   Lifting restrictions      Comments:   No lifting for 4 weeks   Call MD for:  temperature >100.5      Call MD for:  redness, tenderness, or signs of infection (pain, swelling, bleeding, redness, odor or green/yellow discharge around incision site)      Call MD for:  severe or increased pain, loss or decreased feeling  in affected limb(s)      Increase activity slowly      Comments:   Walk with assistance use walker or cane as needed   May shower       Scheduling Instructions:   Sunday   No dressing needed      may wash over wound with mild soap and water      CAROTID Sugery: Call MD for difficulty swallowing or speaking; weakness in arms or legs that is a new symtom; severe headache.  If you have increased swelling in the neck and/or  are having difficulty breathing, CALL 911         Maurice, [name]  Home Medication Instructions [id number]   Printed on:03/04/12 1100  Medication Information  simvastatin (ZOCOR) 20 MG tablet Take 20 mg by mouth at bedtime.             metFORMIN (GLUCOPHAGE) 1000 MG tablet Take 1,000 mg by mouth 2 (two) times daily with a meal.             lisinopril (PRINIVIL,ZESTRIL) 20 MG tablet Take 20 mg by mouth daily.             atenolol (TENORMIN) 50 MG tablet Take 50 mg by mouth daily.             aspirin EC 81 MG tablet Take 81 mg by mouth daily.           glipiZIDE (GLUCOTROL XL) 10 MG 24 hr tablet Take 10 mg by mouth daily.           sitaGLIPtin (JANUVIA) 50 MG tablet Take 50 mg by mouth daily.           oxyCODONE-acetaminophen (ROXICET) 5-325 MG per tablet Take 1 tablet by mouth every 4 (four) hours as needed for pain.            Verbal and written Discharge instructions given to the patient. Wound care per Discharge AVS   Signed: Richrd Prime 03/04/2012, 11:00 AM

## 2012-03-04 NOTE — Preoperative (Signed)
Beta Blockers   Reason not to administer Beta Blockers:Not Applicable 

## 2012-03-04 NOTE — Progress Notes (Signed)
CBG taken 220.  Paged Dr. Oneida Alar around [phone].  Pt is resting comfortably.  Will continue to monitor

## 2012-03-04 NOTE — Anesthesia Procedure Notes (Signed)
Procedure Name: Intubation Date/Time: 03/04/2012 7:43 AM Performed by: Jon Gills A Pre-anesthesia Checklist: Patient identified, Emergency Drugs available, Suction available and Patient being monitored Patient Re-evaluated:Patient Re-evaluated prior to inductionOxygen Delivery Method: Circle system utilized Preoxygenation: Pre-oxygenation with 100% oxygen Intubation Type: IV induction Ventilation: Mask ventilation without difficulty and Oral airway inserted - appropriate to patient size Laryngoscope Size: Sabra Heck and 2 Grade View: Grade I Tube type: Oral Tube size: 7.5 mm Number of attempts: 1 Airway Equipment and Method: Stylet and LTA kit utilized Placement Confirmation: ETT inserted through vocal cords under direct vision,  positive ETCO2 and breath sounds checked- equal and bilateral Secured at: 22 cm Tube secured with: Tape Dental Injury: Teeth and Oropharynx as per pre-operative assessment

## 2012-03-04 NOTE — Progress Notes (Signed)
A-line not correlating.  Leveled and zeroed with no improvement.  Will monitor cuff pressure closely.   SBP 130's.    At 2030 CBG 188.  Paged MD on call Oneida Alar) regarding SSI per order.  Have not heard back yet.  Will continue to monitor.   Marlowe Kays RN

## 2012-03-05 LAB — BASIC METABOLIC PANEL
BUN: 11 mg/dL (ref 6–23)
Chloride: 104 mEq/L (ref 96–112)
Creatinine, Ser: 1.05 mg/dL (ref 0.50–1.35)
GFR calc Af Amer: 76 mL/min — ABNORMAL LOW (ref >90)
GFR calc non Af Amer: 65 mL/min — ABNORMAL LOW (ref >90)
Glucose, Bld: 234 mg/dL — ABNORMAL HIGH (ref 70–99)

## 2012-03-05 LAB — GLUCOSE, CAPILLARY

## 2012-03-05 LAB — CBC
HCT: 40.5 % (ref 39.0–52.0)
Hemoglobin: 13.8 g/dL (ref 13.0–17.0)
MCHC: 34.1 g/dL (ref 30.0–36.0)
MCV: 90.2 fL (ref 78.0–100.0)
RDW: 13.5 % (ref 11.5–15.5)

## 2012-03-05 MED ORDER — ATENOLOL 50 MG PO TABS: 75.0000 mg | ORAL_TABLET | Freq: Every day | ORAL | Status: DC

## 2012-03-05 MED ORDER — ATENOLOL 50 MG PO TABS
75.0000 mg | ORAL_TABLET | Freq: Every day | ORAL | Status: DC
  Administered 2012-03-05: 75 mg via ORAL
  Filled 2012-03-05: qty 1

## 2012-03-05 NOTE — Progress Notes (Signed)
[facility] POST - OP CEA PROGRESS NOTE  Date of Surgery: 03/04/2012 Surgeon: Surgeon(s): Rosetta Posner, MD Angelia Mould, MD 1 Day Post-Op REDO right Carotid Endarterectomy .  HPI: [name] is a 78 y.o. male who is 1 Day Post-Op Patient denies headache; Patient denies difficulty swallowing; denies weakness in upper or lower extremities; Pt. denies other symptoms of stroke or TIA.  IMAGING: No results found.  Significant Diagnostic Studies: CBC Lab Results  Component Value Date   WBC 9.1 03/05/2012   HGB 13.8 03/05/2012   HCT 40.5 03/05/2012   MCV 90.2 03/05/2012   PLT 143* 03/05/2012    BMET    Component Value Date/Time   NA 139 03/05/2012 0455   K 4.1 03/05/2012 0455   CL 104 03/05/2012 0455   CO2 24 03/05/2012 0455   GLUCOSE 234* 03/05/2012 0455   BUN 11 03/05/2012 0455   CREATININE 1.05 03/05/2012 0455   CREATININE 1.50* 02/23/2012 1753   CALCIUM 8.8 03/05/2012 0455   GFRNONAA 65* 03/05/2012 0455   GFRAA 76* 03/05/2012 0455    COAG Lab Results  Component Value Date   INR 0.92 02/29/2012   INR 1.0 09/07/2007   No results found for this basename: PTT      Intake/Output Summary (Last 24 hours) at 03/05/12 0820 Last data filed at 03/05/12 0600  Gross per 24 hour  Intake 3106.93 ml  Output   4475 ml  Net -1368.07 ml    Physical Exam:  BP Readings from Last 3 Encounters:  03/05/12 119/74  03/05/12 119/74  02/29/12 157/83   Temp Readings from Last 3 Encounters:  03/05/12 99.3 F (37.4 C) Oral  03/05/12 99.3 F (37.4 C) Oral  02/29/12 97.1 F (36.2 C)    SpO2 Readings from Last 3 Encounters:  03/05/12 93%  03/05/12 93%  02/29/12 95%   Pulse Readings from Last 3 Encounters:  03/05/12 102  03/05/12 102  02/29/12 78    Pt is A&O x 3 Gait is normal Speech is fluent right Neck Wound is healing well Patient with Negative tongue deviation and Negative facial droop Pt has good and equal strength in all  extremities  Assessment: [name] is a 78 y.o. male is S/P redo Right Carotid endarterectomy Pt is, ambulating and taking po well Has no voided yet Hypertension better controlled - Dr Dennard Schaumann follows as outpt.   Plan: Discharge to: Home if voids and BP remains controlled Follow-up in 2 weeks   [place] [phone] 03/05/2012 8:20 AM

## 2012-03-05 NOTE — Discharge Instructions (Signed)
Take BP 3 times a day and record to take to Dr Dennard Schaumann. Make appt with Dr. Dennard Schaumann to review meds in 1-2 weeks

## 2012-03-05 NOTE — Progress Notes (Signed)
Pt d/c home per MD order, d/c instructions give, prescriptions given, pt VSS, family at [facility], pt and family verbalized understanding of instructions for d/c, all questions answered

## 2012-03-08 ENCOUNTER — Encounter (HOSPITAL_COMMUNITY): Payer: Self-pay | Admitting: Vascular Surgery

## 2012-03-08 LAB — POCT I-STAT 4, (NA,K, GLUC, HGB,HCT)
Hemoglobin: 14.3 g/dL (ref 13.0–17.0)
Potassium: 4.7 mEq/L (ref 3.5–5.1)

## 2012-03-14 ENCOUNTER — Encounter: Payer: Self-pay | Admitting: Vascular Surgery

## 2012-03-15 ENCOUNTER — Encounter: Payer: Self-pay | Admitting: Vascular Surgery

## 2012-03-15 ENCOUNTER — Ambulatory Visit (INDEPENDENT_AMBULATORY_CARE_PROVIDER_SITE_OTHER): Payer: Medicare Other | Admitting: Vascular Surgery

## 2012-03-15 VITALS — BP 128/80 | HR 77 | Temp 97.4°F | Ht 65.5 in | Wt 178.0 lb

## 2012-03-15 DIAGNOSIS — Z48812 Encounter for surgical aftercare following surgery on the circulatory system: Secondary | ICD-10-CM

## 2012-03-15 DIAGNOSIS — I6529 Occlusion and stenosis of unspecified carotid artery: Secondary | ICD-10-CM

## 2012-03-15 NOTE — Progress Notes (Signed)
The patient is status post redo right carotid endarterectomy on 03/04/2012. He did well in the hospital discharged on postoperative day 1. He had prior carotid endarterectomy several years ago with Dr. Amedeo Plenty in 2008. He reports some peri-incisional numbness but has had minimal discomfort. 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  Physical exam: Carotid incision is well healed. He has no carotid bruits bilaterally. He is grossly intact neurologically.  Impression and plan: Stable status post redo right carotid endarterectomy. He will return full activity without limitation. We will see him in 6 months with repeat carotid duplex. He will notify should he develop any difficulty in the interim

## 2012-03-16 NOTE — Progress Notes (Signed)
Addended by: Mena Goes on: 03/16/2012 09:58 AM   Modules accepted: Orders

## 2012-09-19 ENCOUNTER — Encounter: Payer: Self-pay | Admitting: Neurosurgery

## 2012-09-20 ENCOUNTER — Encounter: Payer: Self-pay | Admitting: Neurosurgery

## 2012-09-20 ENCOUNTER — Other Ambulatory Visit (INDEPENDENT_AMBULATORY_CARE_PROVIDER_SITE_OTHER): Payer: Medicare Other | Admitting: *Deleted

## 2012-09-20 ENCOUNTER — Ambulatory Visit (INDEPENDENT_AMBULATORY_CARE_PROVIDER_SITE_OTHER): Payer: Medicare Other | Admitting: Neurosurgery

## 2012-09-20 VITALS — BP 135/80 | HR 76 | Resp 18 | Ht 65.0 in | Wt 181.0 lb

## 2012-09-20 DIAGNOSIS — I6529 Occlusion and stenosis of unspecified carotid artery: Secondary | ICD-10-CM

## 2012-09-20 DIAGNOSIS — Z48812 Encounter for surgical aftercare following surgery on the circulatory system: Secondary | ICD-10-CM

## 2012-09-20 NOTE — Progress Notes (Signed)
[facility] [place] Carotid Office Note  CC: Carotid surveillance Referring Physician: Early  History of Present Illness: 78 year old male patient of Dr. Donnetta Hutching who underwent a right CEA in 2008 with Dr. Amedeo Plenty and a redo in May 2013 with Dr. Donnetta Hutching. The patient denies any signs or symptoms of CVA, TIA, amaurosis fugax or any neural deficit. The patient denies any new medical diagnoses or recent surgery  Past Medical History  Diagnosis Date  . Hyperlipidemia     takes Simvastatin daily  . Carotid artery occlusion   . Hypertension     takes Atenolol and Lisinopril daily  . Seasonal allergies   . Arthritis     knee  . Enlarged prostate     pt states no trouble with it  . Diabetes mellitus     metformin,januvia,and glipizide daily  . Impaired hearing, left     but doesn't wear hearing aids    ROS: [x]  Positive   [ ]  Denies    General: [ ]  Weight loss, [ ]  Fever, [ ]  chills Neurologic: [ ]  Dizziness, [ ]  Blackouts, [ ]  Seizure [ ]  Stroke, [ ]  "Mini stroke", [ ]  Slurred speech, [ ]  Temporary blindness; [ ]  weakness in arms or legs, [ ]  Hoarseness Cardiac: [ ]  Chest pain/pressure, [ ]  Shortness of breath at rest [ ]  Shortness of breath with exertion, [ ]  Atrial fibrillation or irregular heartbeat Vascular: [ ]  Pain in legs with walking, [ ]  Pain in legs at rest, [ ]  Pain in legs at night,  [ ]  Non-healing ulcer, [ ]  Blood clot in vein/DVT,   Pulmonary: [ ]  Home oxygen, [ ]  Productive cough, [ ]  Coughing up blood, [ ]  Asthma,  [ ]  Wheezing Musculoskeletal:  [ ]  Arthritis, [ ]  Low back pain, [ ]  Joint pain Hematologic: [ ]  Easy Bruising, [ ]  Anemia; [ ]  Hepatitis Gastrointestinal: [ ]  Blood in stool, [ ]  Gastroesophageal Reflux/heartburn, [ ]  Trouble swallowing Urinary: [ ]  chronic Kidney disease, [ ]  on HD - [ ]  MWF or [ ]  TTHS, [ ]  Burning with urination, [ ]  Difficulty urinating Skin: [ ]  Rashes, [ ]  Wounds Psychological: [ ]  Anxiety, [ ]  Depression   Social  History History  Substance Use Topics  . Smoking status: Former Smoker -- 25 years    Quit date: 10/13/1991  . Smokeless tobacco: Never Used  . Alcohol Use: Yes     Comment: occassional glass of wine    Family History Family History  Problem Relation Age of Onset  . Anesthesia problems Neg Hx   . Hypotension Neg Hx   . Malignant hyperthermia Neg Hx   . Pseudochol deficiency Neg Hx     No Known Allergies  Current Outpatient Prescriptions  Medication Sig Dispense Refill  . aspirin EC 81 MG tablet Take 81 mg by mouth daily.      [name] Kitchen atenolol (TENORMIN) 50 MG tablet Take 1.5 tablets (75 mg total) by mouth daily.  30 tablet  0  . insulin aspart protamine-insulin aspart (NOVOLOG 70/30) (70-30) 100 UNIT/ML injection Inject into the skin daily with breakfast.      . lisinopril (PRINIVIL,ZESTRIL) 20 MG tablet Take 20 mg by mouth daily.        . metFORMIN (GLUCOPHAGE) 1000 MG tablet Take 1,000 mg by mouth 2 (two) times daily with a meal.        . simvastatin (ZOCOR) 20 MG tablet Take 20 mg by mouth at bedtime.        [name] Kitchen  glipiZIDE (GLUCOTROL XL) 10 MG 24 hr tablet Take 10 mg by mouth daily.      . sitaGLIPtin (JANUVIA) 50 MG tablet Take 50 mg by mouth daily.        Physical Examination  Filed Vitals:   09/20/12 1209  BP: 135/80  Pulse: 76  Resp:     Body mass index is 30.12 kg/(m^2).  General:  WDWN in NAD Gait: Normal HEENT: WNL Eyes: Pupils equal Pulmonary: normal non-labored breathing , without Rales, rhonchi,  wheezing Cardiac: RRR, without  Murmurs, rubs or gallops; Abdomen: soft, NT, no masses Skin: no rashes, ulcers noted  Vascular Exam Pulses: 3+ radial pulses bilaterally Carotid bruits: Carotid pulses to auscultation no bruits are heard Extremities without ischemic changes, no Gangrene , no cellulitis; no open wounds;  Musculoskeletal: no muscle wasting or atrophy   Neurologic: A&O X 3; Appropriate Affect ; SENSATION: normal; MOTOR FUNCTION:  moving all  extremities equally. Speech is fluent/normal  Non-Invasive Vascular Imaging CAROTID DUPLEX 09/20/2012  Right ICA 0 - 19% stenosis Left ICA 40 - 59 % stenosis   ASSESSMENT/PLAN: Asymptomatic patient 6 months status post right redo CEA doing well. The patient will followup in 6 months with repeat carotid duplex. The patient's questions were encouraged and answered, he is in agreement with this plan.  Beatris Ship ANP   Clinic MD: Early

## 2012-09-21 NOTE — Addendum Note (Signed)
Addended by: Mena Goes on: 09/21/2012 11:05 AM   Modules accepted: Orders

## 2013-02-20 ENCOUNTER — Other Ambulatory Visit: Payer: Self-pay | Admitting: Family Medicine

## 2013-03-20 ENCOUNTER — Other Ambulatory Visit: Payer: Self-pay | Admitting: Family Medicine

## 2013-03-20 ENCOUNTER — Telehealth: Payer: Self-pay | Admitting: Family Medicine

## 2013-03-21 ENCOUNTER — Other Ambulatory Visit: Payer: Medicare Other

## 2013-03-21 ENCOUNTER — Ambulatory Visit: Payer: Medicare Other | Admitting: Neurosurgery

## 2013-03-21 ENCOUNTER — Other Ambulatory Visit (INDEPENDENT_AMBULATORY_CARE_PROVIDER_SITE_OTHER): Payer: Medicare Other | Admitting: Vascular Surgery

## 2013-03-21 DIAGNOSIS — I6529 Occlusion and stenosis of unspecified carotid artery: Secondary | ICD-10-CM

## 2013-03-21 DIAGNOSIS — Z48812 Encounter for surgical aftercare following surgery on the circulatory system: Secondary | ICD-10-CM

## 2013-03-21 MED ORDER — GLIPIZIDE ER 10 MG PO TB24: 10.0000 mg | ORAL_TABLET | Freq: Every day | ORAL | Status: DC

## 2013-03-21 NOTE — Telephone Encounter (Signed)
Rx Refilled  

## 2013-04-10 ENCOUNTER — Encounter: Payer: Self-pay | Admitting: Vascular Surgery

## 2013-04-11 ENCOUNTER — Ambulatory Visit (INDEPENDENT_AMBULATORY_CARE_PROVIDER_SITE_OTHER): Payer: Medicare Other | Admitting: Vascular Surgery

## 2013-04-11 ENCOUNTER — Encounter: Payer: Self-pay | Admitting: Vascular Surgery

## 2013-04-11 ENCOUNTER — Other Ambulatory Visit: Payer: Self-pay | Admitting: *Deleted

## 2013-04-11 ENCOUNTER — Other Ambulatory Visit: Payer: Medicare Other

## 2013-04-11 ENCOUNTER — Ambulatory Visit: Payer: Medicare Other | Admitting: Vascular Surgery

## 2013-04-11 ENCOUNTER — Encounter: Payer: Self-pay | Admitting: *Deleted

## 2013-04-11 DIAGNOSIS — I6529 Occlusion and stenosis of unspecified carotid artery: Secondary | ICD-10-CM

## 2013-04-11 NOTE — Progress Notes (Addendum)
Patient has today for continued discussion regarding extracranial cerebrovascular occlusive disease. He has a history of prior right carotid endarterectomy with Dr. Amedeo Plenty in 2008 he subsequently developed a high-grade carotid stenosis and underwent a redo endarterectomy by myself in May of 2013. Followup in December of 2013 revealed widely patent endarterectomy site on the right. He presents today after recent duplex followup showed recurrent high-grade right carotid stenosis. He is extremely active at his age of 28. 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 He is quite active and has had no neurologic deficits. Specifically he denies amaurosis fugax, transient ischemic attack or stroke. He does have a family history of multiple uncles on his mother's side having severe strokes in the past.  Past Medical History  Diagnosis Date  . Hyperlipidemia     takes Simvastatin daily  . Carotid artery occlusion   . Hypertension     takes Atenolol and Lisinopril daily  . Seasonal allergies   . Arthritis     knee  . Enlarged prostate     pt states no trouble with it  . Diabetes mellitus     metformin,januvia,and glipizide daily  . Impaired hearing, left     but doesn't wear hearing aids    History  Substance Use Topics  . Smoking status: Former Smoker -- 25 years    Types: Cigarettes    Quit date: 10/13/1991  . Smokeless tobacco: Never Used  . Alcohol Use: No     Comment: occassional glass of wine    Family History  Problem Relation Age of Onset  . Anesthesia problems Neg Hx   . Hypotension Neg Hx   . Malignant hyperthermia Neg Hx   . Pseudochol deficiency Neg Hx   . Cancer Mother     No Known Allergies  Current outpatient prescriptions:aspirin EC 81 MG tablet, Take 81 mg by mouth daily., Disp: , Rfl: ;  atenolol (TENORMIN) 50 MG tablet, Take 1.5 tablets (75 mg total) by mouth daily., Disp: 30 tablet, Rfl: 0;  glipiZIDE (GLUCOTROL XL) 10 MG 24 hr tablet, Take 1 tablet (10 mg  total) by mouth daily., Disp: 30 tablet, Rfl: 5 insulin aspart protamine-insulin aspart (NOVOLOG 70/30) (70-30) 100 UNIT/ML injection, Inject into the skin daily with breakfast., Disp: , Rfl: ;  lisinopril (PRINIVIL,ZESTRIL) 20 MG tablet, TAKE 1 TABLET EVERY DAY, Disp: 30 tablet, Rfl: 3;  metFORMIN (GLUCOPHAGE) 1000 MG tablet, Take 1,000 mg by mouth 2 (two) times daily with a meal.  , Disp: , Rfl: ;  simvastatin (ZOCOR) 20 MG tablet, TAKE 1 TABLET AT BEDTIME, Disp: 30 tablet, Rfl: 5 sitaGLIPtin (JANUVIA) 50 MG tablet, Take 50 mg by mouth daily., Disp: , Rfl:   BP 139/73  Pulse 76  Ht 5\' 5"  (1.651 m)  Wt 175 lb (79.379 kg)  BMI 29.12 kg/m2  SpO2 98%  Body mass index is 29.12 kg/(m^2).       Physical exam well-developed alert white male appearing younger than stated age Carotid artery incision is well-healed the right with no bruits bilaterally Heart regular rate and rhythm without murmur Chest clear with equal breath sounds bilaterally Neurologically he is grossly intact  Duplex from 6/14 was reviewed by myself. This does show a high-grade recurrent right carotid stenosis and no severe stenosis on the left with a 40-59% stenosis. Vertebral artery is antegrade  Had a long discussion with the patient. I explained quite usual to have continued recurrent carotid stenoses. Suspected to be unusual with a  normal duplex 6 months ago and now critical restenosis. I think this clearly is not atherosclerotic disease developing over 6 months. This is atypical for intimal hyperplasia as well. I have recommended a formal catheter-based carotid arteriogram for further evaluation. I did explain options to include observation only versus redo carotid surgery with potential replacement with saphenous vein and also the potential for stenting. I think this recommendation would be pending the arteriogram. We will schedule this at his earliest convenience. He'll notify should he develop any neurologic deficits. I  feel that if a severe recurrent stenosis is found at the time of carotid arteriogram, that carotid stenting would be a better option than third time redo endarterectomy. He would have significant risk of cranial nerve and other complication risk due to this recurrent surgeries. We would make this determination at the time of the formal arteriogram and we proceed with stenting during the same treatment if this does show him to be a good candidate.

## 2013-04-25 ENCOUNTER — Other Ambulatory Visit: Payer: Self-pay

## 2013-04-25 ENCOUNTER — Telehealth: Payer: Self-pay

## 2013-04-25 ENCOUNTER — Encounter (HOSPITAL_COMMUNITY): Payer: Self-pay | Admitting: Pharmacy Technician

## 2013-04-25 MED ORDER — CLOPIDOGREL BISULFATE 75 MG PO TABS: 75.0000 mg | ORAL_TABLET | Freq: Every day | ORAL | Status: DC

## 2013-04-25 NOTE — Telephone Encounter (Signed)
Called pt. to change procedure date for "Carotid angiogram with possible right carotid stent" to 05/09/13 @ 7:30 AM.  Spoke with pt's. wife.  She agreed to the date change from 7/30 to 05/09/13.  Advised wife pt. would need to start on Plavix 75 mg. daily at 1 week prior to procedure; 1st dose to start on 05/02/13.  Wife verb. understanding .  Will fax Rx to pharmacy.

## 2013-05-09 ENCOUNTER — Encounter (HOSPITAL_COMMUNITY)
Admission: RE | Disposition: A | Discharge status: Home / Self Care | Payer: Self-pay | Source: Ambulatory Visit | Attending: Vascular Surgery

## 2013-05-09 ENCOUNTER — Ambulatory Visit (HOSPITAL_COMMUNITY)
Admission: RE | Admit: 2013-05-09 | Discharge: 2013-05-09 | Disposition: A | Discharge status: Home / Self Care | Payer: Medicare Other | Source: Ambulatory Visit | Attending: Vascular Surgery | Admitting: Vascular Surgery

## 2013-05-09 ENCOUNTER — Other Ambulatory Visit: Payer: Self-pay | Admitting: *Deleted

## 2013-05-09 DIAGNOSIS — I6529 Occlusion and stenosis of unspecified carotid artery: Secondary | ICD-10-CM

## 2013-05-09 DIAGNOSIS — E785 Hyperlipidemia, unspecified: Secondary | ICD-10-CM | POA: Insufficient documentation

## 2013-05-09 DIAGNOSIS — Z7982 Long term (current) use of aspirin: Secondary | ICD-10-CM | POA: Insufficient documentation

## 2013-05-09 DIAGNOSIS — Z823 Family history of stroke: Secondary | ICD-10-CM | POA: Insufficient documentation

## 2013-05-09 DIAGNOSIS — M1710 Unilateral primary osteoarthritis, unspecified knee: Secondary | ICD-10-CM | POA: Insufficient documentation

## 2013-05-09 DIAGNOSIS — Z79899 Other long term (current) drug therapy: Secondary | ICD-10-CM | POA: Insufficient documentation

## 2013-05-09 DIAGNOSIS — J301 Allergic rhinitis due to pollen: Secondary | ICD-10-CM | POA: Insufficient documentation

## 2013-05-09 DIAGNOSIS — H9190 Unspecified hearing loss, unspecified ear: Secondary | ICD-10-CM | POA: Insufficient documentation

## 2013-05-09 DIAGNOSIS — Z87891 Personal history of nicotine dependence: Secondary | ICD-10-CM | POA: Insufficient documentation

## 2013-05-09 DIAGNOSIS — I10 Essential (primary) hypertension: Secondary | ICD-10-CM | POA: Insufficient documentation

## 2013-05-09 DIAGNOSIS — E119 Type 2 diabetes mellitus without complications: Secondary | ICD-10-CM | POA: Insufficient documentation

## 2013-05-09 DIAGNOSIS — I658 Occlusion and stenosis of other precerebral arteries: Secondary | ICD-10-CM | POA: Insufficient documentation

## 2013-05-09 DIAGNOSIS — Z794 Long term (current) use of insulin: Secondary | ICD-10-CM | POA: Insufficient documentation

## 2013-05-09 DIAGNOSIS — I7789 Other specified disorders of arteries and arterioles: Secondary | ICD-10-CM | POA: Insufficient documentation

## 2013-05-09 HISTORY — PX: CAROTID ANGIOGRAM: SHX5504

## 2013-05-09 LAB — POCT I-STAT, CHEM 8
Creatinine, Ser: 1.2 mg/dL (ref 0.50–1.35)
HCT: 45 % (ref 39.0–52.0)
Hemoglobin: 15.3 g/dL (ref 13.0–17.0)
Potassium: 4.3 mEq/L (ref 3.5–5.1)
Sodium: 139 mEq/L (ref 135–145)
TCO2: 24 mmol/L (ref 0–100)

## 2013-05-09 LAB — GLUCOSE, CAPILLARY
Glucose-Capillary: 202 mg/dL — ABNORMAL HIGH (ref 70–99)
Glucose-Capillary: 191 mg/dL — ABNORMAL HIGH (ref 70–99)

## 2013-05-09 SURGERY — CAROTID ANGIOGRAM
Anesthesia: LOCAL

## 2013-05-09 MED ORDER — ACETAMINOPHEN 325 MG PO TABS: 325.0000 mg | ORAL_TABLET | ORAL | Status: DC | PRN

## 2013-05-09 MED ORDER — HYDRALAZINE HCL 20 MG/ML IJ SOLN
INTRAMUSCULAR | Status: AC
  Filled 2013-05-09: qty 1

## 2013-05-09 MED ORDER — LIDOCAINE HCL (PF) 1 % IJ SOLN
INTRAMUSCULAR | Status: AC
  Filled 2013-05-09: qty 30

## 2013-05-09 MED ORDER — LABETALOL HCL 5 MG/ML IV SOLN: 10.0000 mg | INTRAVENOUS | Status: DC | PRN

## 2013-05-09 MED ORDER — ALUM & MAG HYDROXIDE-SIMETH 200-200-20 MG/5ML PO SUSP: 15.0000 mL | ORAL | Status: DC | PRN

## 2013-05-09 MED ORDER — HYDRALAZINE HCL 20 MG/ML IJ SOLN
10.0000 mg | INTRAMUSCULAR | Status: DC | PRN
  Administered 2013-05-09: 10 mg via INTRAVENOUS

## 2013-05-09 MED ORDER — CLOPIDOGREL BISULFATE 75 MG PO TABS
75.0000 mg | ORAL_TABLET | Freq: Once | ORAL | Status: AC
  Administered 2013-05-09: 75 mg via ORAL

## 2013-05-09 MED ORDER — ASPIRIN 81 MG PO CHEW
CHEWABLE_TABLET | ORAL | Status: AC
  Filled 2013-05-09: qty 1

## 2013-05-09 MED ORDER — ACETAMINOPHEN 325 MG RE SUPP: 325.0000 mg | RECTAL | Status: DC | PRN

## 2013-05-09 MED ORDER — METOPROLOL TARTRATE 1 MG/ML IV SOLN: 2.0000 mg | INTRAVENOUS | Status: DC | PRN

## 2013-05-09 MED ORDER — CLOPIDOGREL BISULFATE 75 MG PO TABS
ORAL_TABLET | ORAL | Status: AC
  Filled 2013-05-09: qty 1

## 2013-05-09 MED ORDER — ASPIRIN 81 MG PO CHEW
81.0000 mg | CHEWABLE_TABLET | Freq: Once | ORAL | Status: AC
  Administered 2013-05-09: 81 mg via ORAL

## 2013-05-09 MED ORDER — ONDANSETRON HCL 4 MG/2ML IJ SOLN: 4.0000 mg | Freq: Four times a day (QID) | INTRAMUSCULAR | Status: DC | PRN

## 2013-05-09 MED ORDER — GUAIFENESIN-DM 100-10 MG/5ML PO SYRP: 15.0000 mL | ORAL_SOLUTION | ORAL | Status: DC | PRN

## 2013-05-09 MED ORDER — MORPHINE SULFATE 10 MG/ML IJ SOLN: 2.0000 mg | INTRAMUSCULAR | Status: DC | PRN

## 2013-05-09 MED ORDER — SODIUM CHLORIDE 0.9 % IV SOLN
INTRAVENOUS | Status: DC
  Administered 2013-05-09: 07:00:00 via INTRAVENOUS

## 2013-05-09 MED ORDER — SODIUM CHLORIDE 0.9 % IV SOLN: 1.0000 mL/kg/h | INTRAVENOUS | Status: DC

## 2013-05-09 MED ORDER — PHENOL 1.4 % MT LIQD: 1.0000 | OROMUCOSAL | Status: DC | PRN

## 2013-05-09 MED ORDER — HEPARIN (PORCINE) IN NACL 2-0.9 UNIT/ML-% IJ SOLN
INTRAMUSCULAR | Status: AC
  Filled 2013-05-09: qty 1000

## 2013-05-09 MED ORDER — BIVALIRUDIN 250 MG IV SOLR
INTRAVENOUS | Status: AC
  Filled 2013-05-09: qty 250

## 2013-05-09 NOTE — Op Note (Signed)
[facility] [place]  Patient name: [name] MRN: [id number] DOB: 02-16-1931 Sex: male  05/09/2013 Pre-operative Diagnosis: Right carotid stenosis, recurrent Post-operative diagnosis:  Same Surgeon:  Eldridge Abrahams Procedure Performed:  1.  ultrasound-guided access, right femoral artery  2.  aortic arch angiogram  3.  first order catheterization (innominate artery)  4.  right carotid angiogram  5.  first order catheterization (left carotid artery)  6.  left carotid angiogram    Indications:   The patient is status post right carotid endarterectomy x2. The most recent was within the past year. Surveillance ultrasound revealed a high-grade stenosis within the right common carotid artery. He comes in for further evaluation and possible stenting.  Procedure:  The patient was identified in the holding area and taken to room 8.  The patient was then placed supine on the table and prepped and draped in the usual sterile fashion.  A time out was called.  Ultrasound was used to evaluate the right common femoral artery.  It was patent .  A digital ultrasound image was acquired.  A micropuncture needle was used to access the right common femoral artery under ultrasound guidance.  An 018 wire was advanced without resistance and a micropuncture sheath was placed.  The 018 wire was removed and a benson wire was placed.  The micropuncture sheath was exchanged for a 5 french sheath. Over a Benson wire a pigtail catheter was advanced into the descending aorta and an aortic arch 8 exam was performed. Next, I tried to cannulate the innominate and left carotid artery with a Berenstein 2 catheter but was unsuccessful. I therefore exchanged out for a Simmons 1 catheter. The catheter was placed into the innominate artery and a right carotid angiogram with intracranial images was performed. Next, the catheter was used to select the left common carotid artery and a left carotid angiogram  with intracranial images were obtained. The intracranial images will be separately interpreted by the neuroradiology.   Findings:   aortic arch:   a type I aortic arch is identified. No aneurysmal changes are visualized. The innominate artery is patent throughout it's course. The right subclavian artery is patent throughout it's visualized portions. The right vertebral artery originates from the right subclavian which is patent throughout it's course. The proximal left common carotid artery is widely patent. The proximal left subclavian artery is widely patent as is the left vertebral artery which originates from the left carotid artery.  Right carotid artery:   right common carotid artery is patent throughout it's course. The external and internal carotid arteries are patent. There is approximately a 50% stenosis within the right internal carotid artery. There also appears to be a size mismatch between the native common carotid artery and the internal carotid artery where it appears to have undergone patch angioplasty. The size mismatch most likely is the explanation for the velocity profile on ultrasound.   Left carotid artery:   the left common carotid artery is patent throughout it's course. The left external carotid artery is widely patent. There is an ulcer at the carotid bifurcation on the left. No significant stenosis is identified.   Intervention:   none   Impression:  #1   type I aortic arch  #2   approximately 50% stenosis within the previous carotid endarterectomy. There is a significant size mismatch between the common and internal carotid artery which most likely is the explanation for the velocity profiles on ultrasound.  #3  ulceration at the left carotid bifurcation without significant stenosis   V. [name], M.D. [facility] [place] Office: [phone] Pager:  [phone]

## 2013-05-09 NOTE — Progress Notes (Signed)
D/c home with wife; right groin unremarkable without bleed/hematoma; Pt instructed to stop Plavix

## 2013-05-10 ENCOUNTER — Telehealth: Payer: Self-pay | Admitting: Vascular Surgery

## 2013-05-10 ENCOUNTER — Telehealth: Payer: Self-pay

## 2013-05-10 NOTE — Telephone Encounter (Signed)
Rec'd call from Dr. Trula Slade; agrees that pt. Needs to notify PCP about rectal bleeding for further evaluation; no other recommendations per Dr. Trula Slade.  Contacted pt's wife and informed Dr. Trula Slade in agreement with notifying PCP for further evaluation of rectal bleeding.

## 2013-05-10 NOTE — Telephone Encounter (Signed)
Rec'd call from pt's wife.  Reported pt. Had bright red blood per rectum this morning.  Estimated a tbsp. of BRB in the toilet, and spots of blood on the toilet tissue.  States pt. Denies any abdominal pain, nausea, or vomiting.  States the right groin puncture site does not show any signs of swelling, discoloration, or bleeding.  Reports pt. Was on Plavix daily x 1 week prior to angiogram 7/29; last dose of Plavix was taken on 7/28.  Advised to call his PCP and will also make Dr. Trula Slade aware.  Wife verb. Understanding.

## 2013-05-10 NOTE — Telephone Encounter (Addendum)
Message copied by Gena Fray on Wed May 10, 2013  9:11 AM ------      Message from: Mena Goes      Created: Tue May 09, 2013 10:41 AM      Regarding: schedule                   ----- Message -----         From: Alfonso Patten, RN         Sent: 05/09/2013   9:18 AM           To: Mena Goes, CMA, [facility]                        ----- Message -----         From: Serafina Mitchell, MD         Sent: 05/09/2013   8:36 AM           To: Patrici Ranks, Alfonso Patten, RN            05/09/2013:            Surgeon:  Eldridge Abrahams      Procedure Performed:       1.  ultrasound-guided access, right femoral artery       2.  aortic arch angiogram       3.  first order catheterization (innominate artery)       4.  right carotid angiogram       5.  first order catheterization (left carotid artery)       6.  left carotid angiogram                   Please schedule the patient to followup with Dr. early in 6 months with a repeat carotid duplex ultrasound ------  Spoke with patient re: 6 month f/u information. Pt voiced understanding. He then asked to speak with a nurse regarding some symptoms he has been having since discharge. Transferred to RN- mailed appt letter, dpm

## 2013-05-12 ENCOUNTER — Ambulatory Visit (INDEPENDENT_AMBULATORY_CARE_PROVIDER_SITE_OTHER): Payer: Medicare Other | Admitting: Family Medicine

## 2013-05-12 ENCOUNTER — Encounter: Payer: Self-pay | Admitting: Family Medicine

## 2013-05-12 VITALS — BP 120/70 | HR 82 | Temp 97.9°F | Resp 18 | Wt 177.0 lb

## 2013-05-12 DIAGNOSIS — E785 Hyperlipidemia, unspecified: Secondary | ICD-10-CM

## 2013-05-12 DIAGNOSIS — I10 Essential (primary) hypertension: Secondary | ICD-10-CM

## 2013-05-12 DIAGNOSIS — IMO0001 Reserved for inherently not codable concepts without codable children: Secondary | ICD-10-CM

## 2013-05-12 NOTE — Patient Instructions (Addendum)
1) Blood pressure- keep it under 140/90.  As long as it is less than that, your blood pressure is fine.  2) Cholesterol-come Monday FASTING for labs.  We want your LDL less than 70.  3) dIABETES- we want your A1c less than 7. Morning (80-130), 2 hours after a meal (80-160)

## 2013-05-12 NOTE — Progress Notes (Signed)
Subjective:    Patient ID: [name], male    DOB: October 04, 1931, 79 y.o.   MRN: [id number]  HPI Patient has a history of a right carotid endarterectomy.  Recent screening ultrasound revealed an 80-99% stenosis in the right carotid artery as well as a 50% stenosis of the left carotid artery. The patient underwent arteriogram which actually revealed a 50% stenosis in the right carotid and no significant stenosis in the left. He is currently taking aspirin 81 mg by mouth daily he is interested in how we can prevent this from worsening.  He has hypertension, hyperlipidemia, and insulin-dependent diabetes. His diabetes he is currently taking 70/30 12 units in the morning.  However his fasting blood sugars are 190.  His blood pressure was elevated at the hospital, but he states he was very nervous. Today his blood pressure is well controlled at 120/70. He is due to check a fasting lipid panel. Past Medical History  Diagnosis Date  . Hyperlipidemia     takes Simvastatin daily  . Carotid artery occlusion   . Hypertension     takes Atenolol and Lisinopril daily  . Seasonal allergies   . Arthritis     knee  . Enlarged prostate     pt states no trouble with it  . Diabetes mellitus     metformin,januvia,and glipizide daily  . Impaired hearing, left     but doesn't wear hearing aids   Past Surgical History  Procedure Laterality Date  . Carotidendartectomy  2008    right side  . Appendectomy      as a child  . Endarterectomy  03/04/2012    Procedure: ENDARTERECTOMY CAROTID;  Surgeon: Rosetta Posner, MD;  Location: [facility] OR;  Service: Vascular;  Laterality: Right;  Redo Right Carotid Endarterectomy with hemasheild patch angioplasty   Current Outpatient Prescriptions on File Prior to Visit  Medication Sig Dispense Refill  . aspirin EC 81 MG tablet Take 81 mg by mouth daily.      [name] Kitchen atenolol (TENORMIN) 50 MG tablet Take 1.5 tablets (75 mg total) by mouth daily.  30 tablet  0  . glipiZIDE (GLUCOTROL  XL) 10 MG 24 hr tablet Take 1 tablet (10 mg total) by mouth daily.  30 tablet  5  . insulin aspart protamine-insulin aspart (NOVOLOG 70/30) (70-30) 100 UNIT/ML injection Inject 10 Units into the skin daily with breakfast.       . lisinopril (PRINIVIL,ZESTRIL) 20 MG tablet Take 20 mg by mouth daily.      . metFORMIN (GLUCOPHAGE) 1000 MG tablet Take 1,000 mg by mouth 2 (two) times daily with a meal.        . simvastatin (ZOCOR) 20 MG tablet Take 20 mg by mouth every evening.      . sitaGLIPtin (JANUVIA) 50 MG tablet Take 50 mg by mouth daily.       No current facility-administered medications on file prior to visit.   No Known Allergies History   Social History  . Marital Status: Married    Spouse Name: N/A    Number of Children: N/A  . Years of Education: N/A   Occupational History  . Not on file.   Social History Main Topics  . Smoking status: Former Smoker -- 25 years    Types: Cigarettes    Quit date: 10/13/1991  . Smokeless tobacco: Never Used  . Alcohol Use: No     Comment: occassional glass of wine  . Drug Use: No  .  Sexually Active: Yes   Other Topics Concern  . Not on file   Social History Narrative  . No narrative on file      Review of Systems  All other systems reviewed and are negative.       Objective:   Physical Exam  Vitals reviewed. Constitutional: He appears well-developed and well-nourished.  Cardiovascular: Normal rate, regular rhythm, normal heart sounds and intact distal pulses.  Exam reveals no gallop and no friction rub.   No murmur heard. Pulmonary/Chest: Effort normal and breath sounds normal. No respiratory distress. He has no wheezes. He has no rales. He exhibits no tenderness.  Abdominal: Soft. Bowel sounds are normal. He exhibits no distension. There is no tenderness. There is no rebound and no guarding.  Skin: Skin is warm.          Assessment & Plan:  1. Type II or unspecified type diabetes mellitus without mention of  complication, uncontrolled Check hemoglobin A1c. If greater than 7, had a second dose of 70/30 in afternoon.  I explained to the patient that the insulin does not last all day long. His morning dose is likely losing effectiveness by dinnertime. This would explain why his fasting sugars the following morning her elevated. - COMPLETE METABOLIC PANEL WITH GFR; Future - Hemoglobin A1c; Future  2. HTN (hypertension) I asked the patient to check his blood pressure on a daily basis. His blood pressures greater than 140/90 we will need to titrate his medications further. - COMPLETE METABOLIC PANEL WITH GFR; Future  3. HLD (hyperlipidemia) Return fasting for a fasting lipid panel. Goal LDL is less than 70. - COMPLETE METABOLIC PANEL WITH GFR; Future - Lipid panel; Future

## 2013-05-12 NOTE — Consult Note (Signed)
[name], [name]               ACCOUNT NO.:  [id number]  MEDICAL RECORD NO.:  [id number]  LOCATION:  [facility]                         FACILITY:  [facility]  PHYSICIAN:  Lakaya Tolen K. Dmya Long, M.D.DATE OF BIRTH:  October 31, 1930  DATE OF CONSULTATION: DATE OF DISCHARGE:  05/09/2013                                CONSULTATION   CLINICAL HISTORY:  Patient with progressive stenosis of the right internal carotid artery.  EXAMINATION:  Bilateral common carotid artery, intracranial interpretations.  FINDINGS:   The  right common carotid arteriogram demonstrates the right internal carotid artery in its distal cervical, petrous, cavernous, and supraclinoid segments to be widely patent.  The right middle artery  and right anterior cerebral artery  Opacify normally  Into the capillary and venous phases.  The left common carotid arteriogram demonstrates the distal cervical, the petrous, and the cavernous segments of the left internal carotid artery to opacify normally.  The left middle and left anterior cerebral arteries grossly opacify normally into the capillary and the venous phases.  IMPRESSION:   Angiographically, no  occlusions, stenosis, dissections, Or aneurysms   seen in the anterior intracranial circulation on the images provided.          ______________________________ [name], M.D.     SKD/MEDQ  D:  05/11/2013  T:  05/11/2013  Job:  [id number]

## 2013-05-15 ENCOUNTER — Other Ambulatory Visit: Payer: Medicare Other

## 2013-05-16 ENCOUNTER — Other Ambulatory Visit: Payer: Medicare Other

## 2013-05-16 LAB — COMPLETE METABOLIC PANEL WITH GFR
AST: 14 U/L (ref 0–37)
Albumin: 4.1 g/dL (ref 3.5–5.2)
BUN: 20 mg/dL (ref 6–23)
Calcium: 9.6 mg/dL (ref 8.4–10.5)
Chloride: 104 mEq/L (ref 96–112)
GFR, Est Non African American: 52 mL/min — ABNORMAL LOW
Glucose, Bld: 114 mg/dL — ABNORMAL HIGH (ref 70–99)
Potassium: 4.8 mEq/L (ref 3.5–5.3)
Total Protein: 6.6 g/dL (ref 6.0–8.3)

## 2013-05-16 LAB — LIPID PANEL
Cholesterol: 82 mg/dL (ref 0–200)
VLDL: 28 mg/dL (ref 0–40)

## 2013-05-16 LAB — HEMOGLOBIN A1C
Hgb A1c MFr Bld: 7.9 % — ABNORMAL HIGH (ref <5.7)
Mean Plasma Glucose: 180 mg/dL — ABNORMAL HIGH (ref <117)

## 2013-05-28 ENCOUNTER — Other Ambulatory Visit: Payer: Self-pay | Admitting: Family Medicine

## 2013-07-27 IMAGING — CT CT ANGIO NECK
1 of 8 series · 5 of 33 positions shown · IV contrast (80CC OMNI 350)
Comparison: None.

CLINICAL DATA: Previous right carotid surgery.  GFR 44.

CT ANGIOGRAPHY NECK
TECHNIQUE: Multidetector CT imaging of the neck was performed
using the standard protocol during bolus administration of
intravenous contrast.  Multiplanar CT image reconstructions
including MIPs were obtained to evaluate the vascular anatomy.
Carotid stenosis measurements (when applicable) are obtained
utilizing NASCET criteria, using the distal internal carotid
diameter as the denominator.
Contrast: 80mL OMNIPAQUE IOHEXOL 350 MG/ML SOLN

[Series 401: axial angio thin · axial · 0.49mm/px · z∈[-34,+114]mm · 5 of 223 slices shown]
[im 38/223  soft-tissue]
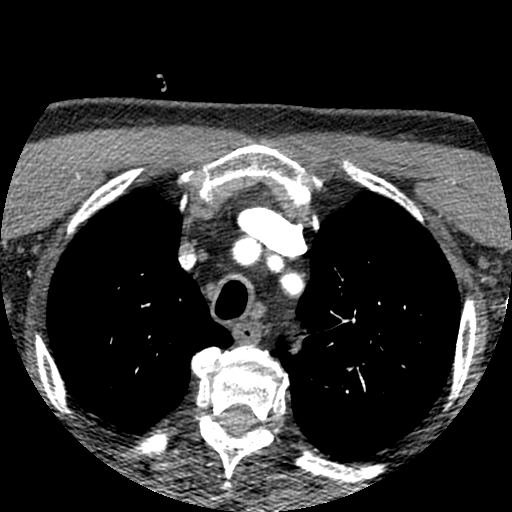
[im 75/223  bone]
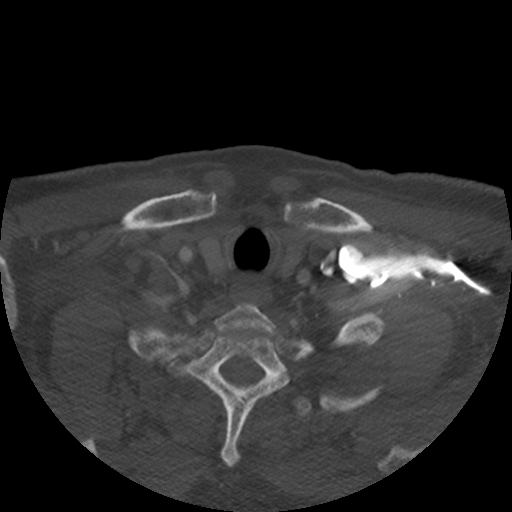
[im 112/223  soft-tissue]
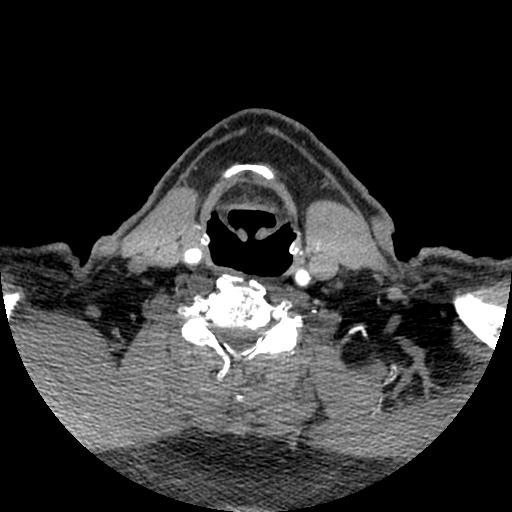
[im 149/223  bone]
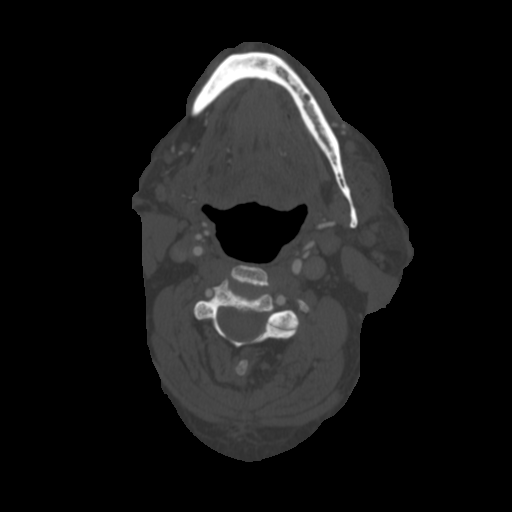
[im 186/223  soft-tissue]
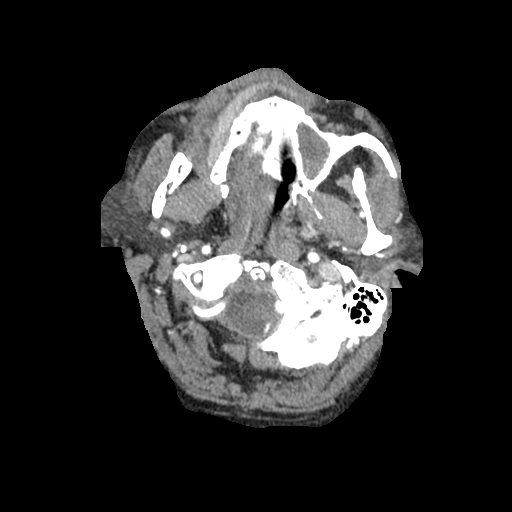

[5 of 33 positions shown; findings below may reference images not displayed]

FINDINGS: Lung apices are clear.  No superior mediastinal
pathology.

The there is atherosclerosis of the aortic arch but no aneurysm or
dissection.  Branching pattern of the brachiocephalic vessels from
the arch is normal.  There is mild atherosclerotic disease at the
brachiocephalic vessel origins but no stenosis.

The innominate artery is widely patent.  There is atherosclerosis
at the innominate bifurcation primarily affecting the proximal
subclavian artery.  Minimal diameter of the subclavian is 5 mm.
Normal diameter at this location would be 12 mm.  This indicates a
60% diameter stenosis.  I think there is mild post stenotic
dilatation distal to that.

The right common carotid artery shows atherosclerosis along its
course.  There has been previous carotid endarterectomy.  There is
recurrent stenosis at the proximal margin of the endarterectomy.
Minimal stenosis is on the order of minimal diameter is on the
order of 2 mm.  Proximal to that, vessel diameter is 7.5 mm.  This
indicates a 70% diameter stenosis.  Because of the weblike nature,
this could possibly be more severe.  Beyond that, the internal
carotid artery is patent without any focal stenosis.

The left common carotid artery shows mild atherosclerosis along its
course to the bifurcation.  There is complex atherosclerotic
disease at the carotid bifurcation.  Diameter of the proximal ICA
is 2.5 mm.  Diameter at the distal bulb is 2 mm.  Compared to a
more distal cervical ICA diameter of 5 mm, these represent 50% and
60% serial stenoses.  Beyond that, the cervical ICA is patent.

The left subclavian artery does not show any proximal stenosis.
The left vertebral artery is the dominant vessel.  There is a
weblike stenosis at the origin estimated at 40%.  There is stenosis
of the proximal vertebral artery at the C7 level with diameter of
1.5 mm.  I estimate this stenosis to be on the order of 50%.
Distal to that, the vessel shows atherosclerotic change throughout
its course through the neck with multiple serial stenoses in the 25-
50% range.  The right vertebral artery shows narrowing at its
origin estimated at 25%.  Beyond that, the vessel is patent through
the cervical region with areas of mild narrowing, none greater than
25%.  Both vertebral arteries are patent at and through the foramen
magnum to the basilar.

No significant soft tissue lesion of the neck is identified.

 Review of the MIP images confirms the above findings.
IMPRESSION: Previous carotid endarterectomy on the right.  Recurrent stenosis
of the proximal endarterectomy margin with a weblike stenosis.
Minimal diameter measures 2 mm.

Complex atherosclerotic disease at the carotid bifurcation region
on the left.  Serial stenoses estimated at 50 and 60%.

60% stenosis of the right subclavian origin.

Bilateral vertebral artery atherosclerotic disease.  The dominant
left vertebral artery is more severely affected than the
nondominant right vertebral artery.  See above discussion.

## 2013-08-24 ENCOUNTER — Other Ambulatory Visit: Payer: Self-pay | Admitting: *Deleted

## 2013-08-24 MED ORDER — METFORMIN HCL 1000 MG PO TABS: 1000.0000 mg | ORAL_TABLET | Freq: Two times a day (BID) | ORAL | Status: DC

## 2013-08-24 MED ORDER — SITAGLIPTIN PHOSPHATE 50 MG PO TABS: 50.0000 mg | ORAL_TABLET | Freq: Every day | ORAL | Status: DC

## 2013-08-24 NOTE — Telephone Encounter (Signed)
Meds refilled.

## 2013-08-24 NOTE — Telephone Encounter (Signed)
Med refilled.

## 2013-08-28 ENCOUNTER — Other Ambulatory Visit: Payer: Self-pay | Admitting: Family Medicine

## 2013-10-09 ENCOUNTER — Other Ambulatory Visit: Payer: Self-pay | Admitting: Family Medicine

## 2013-10-10 ENCOUNTER — Other Ambulatory Visit: Payer: Self-pay | Admitting: Family Medicine

## 2013-10-10 NOTE — Telephone Encounter (Signed)
Medication refilled per protocol. 

## 2013-11-13 ENCOUNTER — Encounter: Payer: Self-pay | Admitting: Vascular Surgery

## 2013-11-14 ENCOUNTER — Ambulatory Visit (INDEPENDENT_AMBULATORY_CARE_PROVIDER_SITE_OTHER): Payer: Medicare Other | Admitting: Vascular Surgery

## 2013-11-14 ENCOUNTER — Encounter (INDEPENDENT_AMBULATORY_CARE_PROVIDER_SITE_OTHER): Payer: Self-pay

## 2013-11-14 ENCOUNTER — Encounter: Payer: Self-pay | Admitting: Vascular Surgery

## 2013-11-14 ENCOUNTER — Ambulatory Visit (HOSPITAL_COMMUNITY)
Admission: RE | Admit: 2013-11-14 | Discharge: 2013-11-14 | Disposition: A | Discharge status: Home / Self Care | Payer: Medicare Other | Source: Ambulatory Visit | Attending: Vascular Surgery | Admitting: Vascular Surgery

## 2013-11-14 VITALS — BP 136/84 | HR 82 | Resp 18 | Ht 65.0 in | Wt 172.0 lb

## 2013-11-14 DIAGNOSIS — I6529 Occlusion and stenosis of unspecified carotid artery: Secondary | ICD-10-CM

## 2013-11-14 DIAGNOSIS — Z48812 Encounter for surgical aftercare following surgery on the circulatory system: Secondary | ICD-10-CM

## 2013-11-14 DIAGNOSIS — Z9889 Other specified postprocedural states: Secondary | ICD-10-CM | POA: Insufficient documentation

## 2013-11-14 NOTE — Addendum Note (Signed)
Addended by: Dorthula Rue L on: 11/14/2013 12:07 PM   Modules accepted: Orders

## 2013-11-14 NOTE — Progress Notes (Signed)
Patient has today for continued followup of extracranial history vascular occlusive disease. He is status post right carotid endarterectomy in 2800 and redo 2013. He was seen 6 months ago with duplex suggesting recurrent severe stenosis in his right internal carotid artery after endarterectomy in 2013. He underwent catheter-based arteriogram at that same setting and this showed 50% stenosis with significant change in caliber from the endarterectomy site to the internal carotid artery. He is here today for duplex followup. He specifically denies any neurologic deficits, no amaurosis fugax transient ischemic attack or stroke. 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  Past Medical History  Diagnosis Date  . Hyperlipidemia     takes Simvastatin daily  . Carotid artery occlusion   . Hypertension     takes Atenolol and Lisinopril daily  . Seasonal allergies   . Arthritis     knee  . Enlarged prostate     pt states no trouble with it  . Diabetes mellitus     metformin,januvia,and glipizide daily  . Impaired hearing, left     but doesn't wear hearing aids    History  Substance Use Topics  . Smoking status: Former Smoker -- 25 years    Types: Cigarettes    Quit date: 10/13/1991  . Smokeless tobacco: Never Used  . Alcohol Use: No     Comment: occassional glass of wine    Family History  Problem Relation Age of Onset  . Anesthesia problems Neg Hx   . Hypotension Neg Hx   . Malignant hyperthermia Neg Hx   . Pseudochol deficiency Neg Hx   . Cancer Mother     No Known Allergies  Current outpatient prescriptions:aspirin EC 81 MG tablet, Take 81 mg by mouth daily., Disp: , Rfl: ;  atenolol (TENORMIN) 50 MG tablet, 1 TABLET BY MOUTH EVERY DAY, Disp: 30 tablet, Rfl: 5;  glipiZIDE (GLUCOTROL XL) 10 MG 24 hr tablet, Take 1 tablet (10 mg total) by mouth daily., Disp: 30 tablet, Rfl: 5 insulin aspart protamine-insulin aspart  (NOVOLOG 70/30) (70-30) 100 UNIT/ML injection, Inject 10 Units into the skin daily with breakfast. , Disp: , Rfl: ;  lisinopril (PRINIVIL,ZESTRIL) 20 MG tablet, TAKE 1 TABLET EVERY DAY, Disp: 30 tablet, Rfl: 3;  metFORMIN (GLUCOPHAGE) 1000 MG tablet, Take 1 tablet (1,000 mg total) by mouth 2 (two) times daily with a meal., Disp: 60 tablet, Rfl: 2 simvastatin (ZOCOR) 20 MG tablet, TAKE 1 TABLET BY MOUTH AT BEDTIME, Disp: 30 tablet, Rfl: 5;  sitaGLIPtin (JANUVIA) 50 MG tablet, Take 1 tablet (50 mg total) by mouth daily., Disp: 30 tablet, Rfl: 2  BP 136/84  Pulse 82  Resp 18  Ht 5\' 5"  (1.651 m)  Wt 172 lb (78.019 kg)  BMI 28.62 kg/m2  Body mass index is 28.62 kg/(m^2).       Physical exam: Well-developed well-nourished gentleman appearing stated age in no acute distress Respirations equal nonlabored Heart regular rate and rhythm Carotid incision is well-healed with no bruits bilaterally 2+ radial pulses bilaterally Neurologically he is grossly intact  Carotid duplex today reveals probable intimal hyperplasia in the endarterectomy site with a estimated 60-79% stenosis. The left stenosis suggestions 40-59%  Impression and plan: Stable carotid disease. No evidence of worsening narrowing in the endarterectomy site of the right. Again discussed symptoms of carotid disease with the patient is notified should this occur. Otherwise we'll see him again in 6 months with  repeat carotid duplex

## 2013-11-21 ENCOUNTER — Other Ambulatory Visit: Payer: Self-pay | Admitting: Family Medicine

## 2014-02-21 ENCOUNTER — Other Ambulatory Visit: Payer: Self-pay | Admitting: Family Medicine

## 2014-03-15 ENCOUNTER — Other Ambulatory Visit: Payer: Self-pay | Admitting: *Deleted

## 2014-03-15 MED ORDER — SIMVASTATIN 20 MG PO TABS: ORAL_TABLET | ORAL | Status: DC

## 2014-03-15 NOTE — Telephone Encounter (Signed)
Prescription sent to pharmacy.

## 2014-04-06 ENCOUNTER — Other Ambulatory Visit: Payer: Self-pay | Admitting: Family Medicine

## 2014-04-30 ENCOUNTER — Other Ambulatory Visit: Payer: Self-pay | Admitting: Family Medicine

## 2014-05-14 ENCOUNTER — Encounter: Payer: Self-pay | Admitting: Family

## 2014-05-15 ENCOUNTER — Ambulatory Visit (INDEPENDENT_AMBULATORY_CARE_PROVIDER_SITE_OTHER): Payer: Medicare Other | Admitting: Family

## 2014-05-15 ENCOUNTER — Ambulatory Visit (HOSPITAL_COMMUNITY)
Admission: RE | Admit: 2014-05-15 | Discharge: 2014-05-15 | Disposition: A | Discharge status: Home / Self Care | Payer: Medicare Other | Source: Ambulatory Visit | Attending: Family | Admitting: Family

## 2014-05-15 ENCOUNTER — Encounter: Payer: Self-pay | Admitting: Family

## 2014-05-15 VITALS — BP 134/82 | HR 76 | Resp 16 | Ht 65.0 in | Wt 170.0 lb

## 2014-05-15 DIAGNOSIS — Z48812 Encounter for surgical aftercare following surgery on the circulatory system: Secondary | ICD-10-CM

## 2014-05-15 DIAGNOSIS — I6529 Occlusion and stenosis of unspecified carotid artery: Secondary | ICD-10-CM

## 2014-05-15 NOTE — Addendum Note (Signed)
Addended by: Mena Goes on: 05/15/2014 04:24 PM   Modules accepted: Orders

## 2014-05-15 NOTE — Progress Notes (Signed)
Established Carotid Patient   History of Present Illness  [name] is a 80 y.o. male patient of Dr. Donnetta Hutching who is status post right carotid endarterectomy in 2008 and redo 2013. He was seen 6 months ago with duplex suggesting recurrent severe stenosis in his right internal carotid artery after endarterectomy in 2013. He underwent catheter-based arteriogram at that same setting and this showed 50% stenosis with significant change in caliber from the endarterectomy site to the internal carotid artery.   Patient has Negative history of TIA or stroke symptom.  The patient denies amaurosis fugax or monocular blindness.  The patient  denies facial drooping.  Pt. denies hemiplegia.  The patient denies receptive or expressive aphasia.   He denies claudication symptoms in legs with walking, denies non healing wounds.   Pt denies New Medical or Surgical History.  Pt Diabetic: Yes, states in good control Pt smoker: former smoker, quit in 1993  Pt meds include: Statin : Yes ASA: Yes Other anticoagulants/antiplatelets: no   Past Medical History  Diagnosis Date  . Hyperlipidemia     takes Simvastatin daily  . Carotid artery occlusion   . Hypertension     takes Atenolol and Lisinopril daily  . Seasonal allergies   . Arthritis     knee  . Enlarged prostate     pt states no trouble with it  . Diabetes mellitus     metformin,januvia,and glipizide daily  . Impaired hearing, left     but doesn't wear hearing aids    Social History History  Substance Use Topics  . Smoking status: Former Smoker -- 25 years    Types: Cigarettes    Quit date: 10/13/1991  . Smokeless tobacco: Never Used  . Alcohol Use: No     Comment: occassional glass of wine    Family History Family History  Problem Relation Age of Onset  . Anesthesia problems Neg Hx   . Hypotension Neg Hx   . Malignant hyperthermia Neg Hx   . Pseudochol deficiency Neg Hx   . Cancer Mother     Surgical History Past  Surgical History  Procedure Laterality Date  . Carotidendartectomy  2008    right side  . Appendectomy      as a child  . Endarterectomy  03/04/2012    Procedure: ENDARTERECTOMY CAROTID;  Surgeon: Rosetta Posner, MD;  Location: [facility] OR;  Service: Vascular;  Laterality: Right;  Redo Right Carotid Endarterectomy with hemasheild patch angioplasty    No Known Allergies  Current Outpatient Prescriptions  Medication Sig Dispense Refill  . aspirin EC 81 MG tablet Take 81 mg by mouth daily.      [name] Kitchen atenolol (TENORMIN) 50 MG tablet TAKE 1 TABLET BY MOUTH DAILY  30 tablet  1  . glipiZIDE (GLUCOTROL XL) 10 MG 24 hr tablet Take 1 tablet (10 mg total) by mouth daily.  30 tablet  5  . insulin aspart protamine-insulin aspart (NOVOLOG 70/30) (70-30) 100 UNIT/ML injection Inject 10 Units into the skin daily with breakfast.       . JANUVIA 50 MG tablet TAKE 1 TABLET BY MOUTH DAILY  30 tablet  1  . lisinopril (PRINIVIL,ZESTRIL) 20 MG tablet TAKE 1 TABLET EVERY DAY  30 tablet  3  . metFORMIN (GLUCOPHAGE) 1000 MG tablet TAKE 1 TABLET BY MOUTH TWICE A DAY WITH A MEAL  60 tablet  1  . simvastatin (ZOCOR) 20 MG tablet TAKE 1 TABLET BY MOUTH AT BEDTIME  30 tablet  1   No current facility-administered medications for this visit.    Review of Systems : See HPI for pertinent positives and negatives.  Physical Examination  Filed Vitals:   05/15/14 1435 05/15/14 1438  BP: 124/84 134/82  Pulse: 75 76  Resp:  16  Height:  5\' 5"  (1.651 m)  Weight:  170 lb (77.111 kg)  SpO2:  97%   Body mass index is 28.29 kg/(m^2).  General: WDWN male in NAD GAIT: normal Eyes: PERRLA Pulmonary:  Non-labored, CTAB, Negative  Rales, Negative rhonchi, & Negative wheezing.  Cardiac: regular Rhythm ,  Negative detected murmur.  VASCULAR EXAM Carotid Bruits Right Left   Negative Negative    Aorta is not palpable. Radial pulses are 2+ palpable and equal.                                                                                                                        Gastrointestinal: soft, nontender, BS WNL, no r/g,  negative masses.  Musculoskeletal: Negative muscle atrophy/wasting. M/S 5/5 throughout, Extremities without ischemic changes.  Neurologic: A&O X 3; Appropriate Affect ; SENSATION ;normal;  Speech is normal CN 2-12 intact except is hard of hearing, Pain and light touch intact in extremities, Motor exam as listed above.   Non-Invasive Vascular Imaging CAROTID DUPLEX 05/15/2014   CEREBROVASCULAR DUPLEX EVALUATION    INDICATION: Carotid stenosis    PREVIOUS INTERVENTION(S): Right carotid angiogram 05/09/2013; Right carotid endarterectomy 03/04/2012 (redo) ; Right carotid endarterectomy 07/13/2007    DUPLEX EXAM:     RIGHT  LEFT  Peak Systolic Velocities (cm/s) End Diastolic Velocities (cm/s) Plaque LOCATION Peak Systolic Velocities (cm/s) End Diastolic Velocities (cm/s) Plaque  47 8  CCA PROXIMAL 89 15   64 16 HM CCA MID 56 12 HT  53 11 HM CCA DISTAL 66 16 HT  107 16 HM ECA 179 23 HT  200 46 HM ICA PROXIMAL 111 32 HT  149 35  ICA MID 211 48 HT  114 30  ICA DISTAL 60 19     carotid endarterectomy ICA / CCA Ratio (PSV) 1.98  Antegrade Vertebral Flow Antegrade  123456 Brachial Systolic Pressure (mmHg) [id number]  Triphasic Brachial Artery Waveforms Triphasic    Plaque Morphology:  HM = Homogeneous, HT = Heterogeneous, CP = Calcific Plaque, SP = Smooth Plaque, IP = Irregular Plaque     ADDITIONAL FINDINGS:     IMPRESSION: Right common carotid artery disease present. Bilateral internal carotid artery stenosis 40%-59% range with history of a right carotid endarterectomy. Left external carotid artery stenosis.     Compared to the previous exam:  Classification downgraded on the right and stable on the left since previous study on 11/14/2013.    Assessment: [name] is a 80 y.o. male who presents with asymptomatic 40 - 59 % bilateral ICA stenosis. The right  ICA stenosis is  Improved  from previous exam, left ICA stenosis is stable.  Plan: Follow-up in 6 months with Carotid Duplex  scan.   I discussed in depth with the patient the nature of atherosclerosis, and emphasized the importance of maximal medical management including strict control of blood pressure, blood glucose, and lipid levels, obtaining regular exercise, and continued cessation of smoking.  The patient is aware that without maximal medical management the underlying atherosclerotic disease process will progress, limiting the benefit of any interventions. The patient was given information about stroke prevention and what symptoms should prompt the patient to seek immediate medical care. Thank you for allowing [place] to participate in this patient's care.  Clemon Chambers, RN, MSN, FNP-C [facility] Office: [facility] Physician: Early  05/15/2014 2:10 PM

## 2014-05-15 NOTE — Patient Instructions (Signed)
Stroke Prevention Some medical conditions and behaviors are associated with an increased chance of having a stroke. You may prevent a stroke by making healthy choices and managing medical conditions. HOW CAN I REDUCE MY RISK OF HAVING A STROKE?   Stay physically active. Get at least 30 minutes of activity on most or all days.  Do not smoke. It may also be helpful to avoid exposure to secondhand smoke.  Limit alcohol use. Moderate alcohol use is considered to be:  No more than 2 drinks per day for men.  No more than 1 drink per day for nonpregnant women.  Eat healthy foods. This involves:  Eating 5 or more servings of fruits and vegetables a day.  Making dietary changes that address high blood pressure (hypertension), high cholesterol, diabetes, or obesity.  Manage your cholesterol levels.  Making food choices that are high in fiber and low in saturated fat, trans fat, and cholesterol may control cholesterol levels.  Take any prescribed medicines to control cholesterol as directed by your health care provider.  Manage your diabetes.  Controlling your carbohydrate and sugar intake is recommended to manage diabetes.  Take any prescribed medicines to control diabetes as directed by your health care provider.  Control your hypertension.  Making food choices that are low in salt (sodium), saturated fat, trans fat, and cholesterol is recommended to manage hypertension.  Take any prescribed medicines to control hypertension as directed by your health care provider.  Maintain a healthy weight.  Reducing calorie intake and making food choices that are low in sodium, saturated fat, trans fat, and cholesterol are recommended to manage weight.  Stop drug abuse.  Avoid taking birth control pills.  Talk to your health care provider about the risks of taking birth control pills if you are over 35 years old, smoke, get migraines, or have ever had a blood clot.  Get evaluated for sleep  disorders (sleep apnea).  Talk to your health care provider about getting a sleep evaluation if you snore a lot or have excessive sleepiness.  Take medicines only as directed by your health care provider.  For some people, aspirin or blood thinners (anticoagulants) are helpful in reducing the risk of forming abnormal blood clots that can lead to stroke. If you have the irregular heart rhythm of atrial fibrillation, you should be on a blood thinner unless there is a good reason you cannot take them.  Understand all your medicine instructions.  Make sure that other conditions (such as anemia or atherosclerosis) are addressed. SEEK IMMEDIATE MEDICAL CARE IF:   You have sudden weakness or numbness of the face, arm, or leg, especially on one side of the body.  Your face or eyelid droops to one side.  You have sudden confusion.  You have trouble speaking (aphasia) or understanding.  You have sudden trouble seeing in one or both eyes.  You have sudden trouble walking.  You have dizziness.  You have a loss of balance or coordination.  You have a sudden, severe headache with no known cause.  You have new chest pain or an irregular heartbeat. Any of these symptoms may represent a serious problem that is an emergency. Do not wait to see if the symptoms will go away. Get medical help at once. Call your local emergency services (911 in [place] Do not drive yourself to the hospital. Document Released: 11/05/2004 Document Revised: 02/12/2014 Document Reviewed: 03/31/2013 Exit[employer] Patient Information 2015 [employer]. This information is not intended to replace advice given   to you by your health care provider. Make sure you discuss any questions you have with your health care provider.  

## 2014-07-03 ENCOUNTER — Telehealth: Payer: Self-pay | Admitting: Family Medicine

## 2014-07-03 NOTE — Telephone Encounter (Signed)
LM for pt to call back and schedule GREENFOLDER CPE °

## 2014-07-17 ENCOUNTER — Encounter: Payer: Self-pay | Admitting: Family Medicine

## 2014-07-17 ENCOUNTER — Telehealth: Payer: Self-pay | Admitting: Family Medicine

## 2014-07-17 NOTE — Telephone Encounter (Signed)
Letter sent to pt to call and schedule GREENFOLDER CPE °

## 2014-07-23 ENCOUNTER — Other Ambulatory Visit: Payer: Self-pay | Admitting: Family Medicine

## 2014-08-14 ENCOUNTER — Other Ambulatory Visit: Payer: Self-pay | Admitting: Family Medicine

## 2014-08-14 NOTE — Telephone Encounter (Signed)
Medication filled x1 with no refills.   Requires office visit before any further refills can be given.   Letter sent.  

## 2014-08-17 ENCOUNTER — Ambulatory Visit (INDEPENDENT_AMBULATORY_CARE_PROVIDER_SITE_OTHER): Payer: Medicare Other | Admitting: Family Medicine

## 2014-08-17 ENCOUNTER — Encounter: Payer: Self-pay | Admitting: Family Medicine

## 2014-08-17 ENCOUNTER — Other Ambulatory Visit: Payer: Self-pay | Admitting: Family Medicine

## 2014-08-17 VITALS — BP 98/60 | HR 80 | Temp 97.9°F | Resp 18 | Ht 65.0 in | Wt 169.0 lb

## 2014-08-17 DIAGNOSIS — E785 Hyperlipidemia, unspecified: Secondary | ICD-10-CM

## 2014-08-17 DIAGNOSIS — Z23 Encounter for immunization: Secondary | ICD-10-CM

## 2014-08-17 DIAGNOSIS — E1165 Type 2 diabetes mellitus with hyperglycemia: Secondary | ICD-10-CM

## 2014-08-17 DIAGNOSIS — IMO0002 Reserved for concepts with insufficient information to code with codable children: Secondary | ICD-10-CM

## 2014-08-17 DIAGNOSIS — I10 Essential (primary) hypertension: Secondary | ICD-10-CM

## 2014-08-17 NOTE — Progress Notes (Signed)
Subjective:    Patient ID: [name], male    DOB: 1931-09-11, 80 y.o.   MRN: [id number]  HPI  Is a very pleasant 80 year old white male who presents today for follow-up of his diabetes. Apparently he was started on NovoLog Mix 70/30 10 units in the morning by Dr. Trula Slade this summer while he was in the hospital. He has been doing this, Januvia, metformin every day. He states his fasting blood sugars are between 130 and 140. He is unsure if he has been using the glipizide. He does not have his medications today. He is taking an aspirin 81 mg every day. His blood pressure is extremely low today at 98/60. He denies any chest pain shortness of breath or dyspnea on exertion. He denies any syncope. He denies any myalgias or right upper quadrant pain on his statin medication. He is taking an aspirin every day. He denies any bleeding or bruising. He has a history of ASCVD. He is overdue for his flu shot as well as his Prevnar 13.[name] Kitchen Past Medical History  Diagnosis Date  . Hyperlipidemia     takes Simvastatin daily  . Carotid artery occlusion   . Hypertension     takes Atenolol and Lisinopril daily  . Seasonal allergies   . Arthritis     knee  . Enlarged prostate     pt states no trouble with it  . Diabetes mellitus     metformin,januvia,and glipizide daily  . Impaired hearing, left     but doesn't wear hearing aids   Past Surgical History  Procedure Laterality Date  . Carotidendartectomy  2008    right side  . Appendectomy      as a child  . Endarterectomy  03/04/2012    Procedure: ENDARTERECTOMY CAROTID;  Surgeon: Rosetta Posner, MD;  [facility]: [facility];  Service: Vascular;  Laterality: Right;  Redo Right Carotid Endarterectomy with hemasheild patch angioplasty  . Carotid endarterectomy Right 2008     Re-do Mar 04, 2012   Current Outpatient Prescriptions on File Prior to Visit  Medication Sig Dispense Refill  . aspirin EC 81 MG tablet Take 81 mg by mouth daily.    . ASSURE COMFORT  LANCETS 30G MISC     . atenolol (TENORMIN) 50 MG tablet TAKE 1 TABLET BY MOUTH DAILY 30 tablet 3  . Blood Glucose Calibration (OT ULTRA/FASTTK CNTRL SOLN) SOLN     . Blood Glucose Monitoring Suppl (ONE TOUCH ULTRA 2) W/DEVICE KIT     . glipiZIDE (GLUCOTROL XL) 10 MG 24 hr tablet Take 1 tablet (10 mg total) by mouth daily. 30 tablet 5  . JANUVIA 50 MG tablet TAKE 1 TABLET BY MOUTH EVERY DAY 30 tablet 0  . Lancet Devices (ADJUSTABLE LANCING DEVICE) MISC     . metFORMIN (GLUCOPHAGE) 1000 MG tablet TAKE 1 TABLET BY MOUTH TWICE A DAY WITH A MEAL 60 tablet 1  . ONE TOUCH ULTRA TEST test strip     . simvastatin (ZOCOR) 20 MG tablet TAKE 1 TABLET BY MOUTH AT BEDTIME 30 tablet 1  . insulin aspart protamine-insulin aspart (NOVOLOG 70/30) (70-30) 100 UNIT/ML injection Inject 10 Units into the skin daily with breakfast.      No current facility-administered medications on file prior to visit.   No Known Allergies  History   Social History  . Marital Status: Married    Spouse Name: N/A    Number of Children: N/A  . Years of Education: N/A  Occupational History  . Not on file.   Social History Main Topics  . Smoking status: Former Smoker -- 25 years    Types: Cigarettes    Quit date: 10/13/1991  . Smokeless tobacco: Never Used  . Alcohol Use: No     Comment: occassional glass of wine  . Drug Use: No  . Sexual Activity: Yes   Other Topics Concern  . Not on file   Social History Narrative     Review of Systems  All other systems reviewed and are negative.      Objective:   Physical Exam  Constitutional: He appears well-developed and well-nourished.  Cardiovascular: Normal rate, regular rhythm and normal heart sounds.   No murmur heard. Pulmonary/Chest: Effort normal and breath sounds normal. No respiratory distress. He has no wheezes. He has no rales.  Abdominal: Soft. Bowel sounds are normal. He exhibits no distension. There is no tenderness. There is no rebound and no  guarding.  Musculoskeletal: He exhibits no edema.  Vitals reviewed.         Assessment & Plan:  Essential hypertension  Diabetes mellitus type II, uncontrolled - Plan: COMPLETE METABOLIC PANEL WITH GFR, Hemoglobin A1c, Microalbumin, urine  Patient is not checking his sugars regularly.  He denies any hypoglycemia. Check a hemoglobin A1c. I would like to try to keep his hemoglobin A1c less than 8. Ideally I would like him close to 7 if possible. I will further tailor his medications once I see his lab work. I'll also check a CMP as well as a urine microalbumin. His blood pressure is extremely low today and therefore I will discontinue his atenolol. The patient received his flu vaccine as well as Prevnar 13 today in the office. I recommended he continue to take aspirin 81 mg by mouth daily for prevention of cardiovascular disease. I am hesitant to want to increase this patient's insulin due to his risk of hypoglycemia particularly given the fact that he is not regularly checking his sugars.  I would like the patient to return fasting for fasting lipid panel at his earliest convenience. Patient's goal LDL cholesterol would be less than 70.  I recommended an annual diabetic eye exam.

## 2014-08-17 NOTE — Addendum Note (Signed)
Addended by: Shary Decamp B on: 08/17/2014 04:31 PM   Modules accepted: Orders

## 2014-08-18 LAB — COMPLETE METABOLIC PANEL WITH GFR
ALBUMIN: 4.2 g/dL (ref 3.5–5.2)
ALT: 13 U/L (ref 0–53)
AST: 13 U/L (ref 0–37)
Alkaline Phosphatase: 83 U/L (ref 39–117)
BUN: 20 mg/dL (ref 6–23)
CALCIUM: 9.6 mg/dL (ref 8.4–10.5)
CHLORIDE: 102 meq/L (ref 96–112)
CO2: 22 mEq/L (ref 19–32)
CREATININE: 1.22 mg/dL (ref 0.50–1.35)
GFR, EST NON AFRICAN AMERICAN: 55 mL/min — AB
GFR, Est African American: 64 mL/min
GLUCOSE: 145 mg/dL — AB (ref 70–99)
POTASSIUM: 4.6 meq/L (ref 3.5–5.3)
Sodium: 137 mEq/L (ref 135–145)
Total Bilirubin: 1.1 mg/dL (ref 0.2–1.2)
Total Protein: 6.8 g/dL (ref 6.0–8.3)

## 2014-08-18 LAB — HEMOGLOBIN A1C
HEMOGLOBIN A1C: 10.4 % — AB (ref <5.7)
Mean Plasma Glucose: 252 mg/dL — ABNORMAL HIGH (ref <117)

## 2014-08-18 LAB — MICROALBUMIN, URINE: Microalb, Ur: 0.7 mg/dL (ref <2.0)

## 2014-08-27 ENCOUNTER — Other Ambulatory Visit: Payer: Self-pay | Admitting: Family Medicine

## 2014-08-27 MED ORDER — INSULIN GLARGINE 100 UNITS/ML SOLOSTAR PEN: PEN_INJECTOR | SUBCUTANEOUS | Status: DC

## 2014-08-27 MED ORDER — INSULIN GLARGINE 100 UNITS/ML SOLOSTAR PEN
PEN_INJECTOR | SUBCUTANEOUS | Status: DC
Start: 2014-08-27 — End: 2014-08-28

## 2014-08-27 MED ORDER — "PEN NEEDLES 5/16"" 31G X 8 MM MISC": Status: DC

## 2014-08-28 ENCOUNTER — Telehealth: Payer: Self-pay | Admitting: Family Medicine

## 2014-08-28 NOTE — Telephone Encounter (Signed)
Pt insurance will not cover Lantus but will cover Levemir at a $45 copay. Will give pt samples until insurance changes first of year and will get PA if needed at that time.

## 2014-09-04 ENCOUNTER — Telehealth: Payer: Self-pay | Admitting: Family Medicine

## 2014-09-04 NOTE — Telephone Encounter (Signed)
Patients wife is calling to ask if [name] can take his insulin twice daily to regulate his sugars if possible  [phone]

## 2014-09-05 NOTE — Telephone Encounter (Signed)
Per [facility] pt does not need to take BID as the Levemir works for 24 hours. I explained to her that the pt needs to take the Levemir 15 units and then increase by 1 units everyday until his FBS is 130 or below. I explained to her that I gave him samples that should get him till the first of the year but if not will give him more samples until his insurance changes at the first of the year. She verbalized understanding and will call us back with his BS readings.

## 2014-09-13 ENCOUNTER — Encounter: Payer: Self-pay | Admitting: Family Medicine

## 2014-09-13 ENCOUNTER — Ambulatory Visit (INDEPENDENT_AMBULATORY_CARE_PROVIDER_SITE_OTHER): Payer: Medicare Other | Admitting: Family Medicine

## 2014-09-13 VITALS — BP 144/78 | HR 78 | Temp 98.0°F | Resp 16 | Ht 65.0 in | Wt 171.0 lb

## 2014-09-13 DIAGNOSIS — IMO0002 Reserved for concepts with insufficient information to code with codable children: Secondary | ICD-10-CM

## 2014-09-13 DIAGNOSIS — E1165 Type 2 diabetes mellitus with hyperglycemia: Secondary | ICD-10-CM

## 2014-09-13 MED ORDER — LISINOPRIL 20 MG PO TABS: 20.0000 mg | ORAL_TABLET | Freq: Every day | ORAL | Status: DC

## 2014-09-13 NOTE — Progress Notes (Signed)
Subjective:    Patient ID: [name], male    DOB: Nov 13, 1930, 80 y.o.   MRN: [id number]  HPI Please see the patient's last office visit. He is very confused as to what he is taking. I discontinued his atenolol at the last office visit due to low blood pressure. The patient quit taking lisinopril by accident. He is currently only taking aspirin and metformin. He is also on Levemir 20 units daily. His blood sugars have been ranging 200-2 10 in the morning fasting. He denies any hypoglycemia. Past Medical History  Diagnosis Date  . Hyperlipidemia     takes Simvastatin daily  . Carotid artery occlusion   . Hypertension     takes Atenolol and Lisinopril daily  . Seasonal allergies   . Arthritis     knee  . Enlarged prostate     pt states no trouble with it  . Diabetes mellitus     metformin,januvia,and glipizide daily  . Impaired hearing, left     but doesn't wear hearing aids   Past Surgical History  Procedure Laterality Date  . Carotidendartectomy  2008    right side  . Appendectomy      as a child  . Endarterectomy  03/04/2012    Procedure: ENDARTERECTOMY CAROTID;  Surgeon: Rosetta Posner, MD;  Location: [facility] OR;  Service: Vascular;  Laterality: Right;  Redo Right Carotid Endarterectomy with hemasheild patch angioplasty  . Carotid endarterectomy Right 2008     Re-do Mar 04, 2012   Current Outpatient Prescriptions on File Prior to Visit  Medication Sig Dispense Refill  . aspirin EC 81 MG tablet Take 81 mg by mouth daily.    . ASSURE COMFORT LANCETS 30G MISC     . Blood Glucose Calibration (OT ULTRA/FASTTK CNTRL SOLN) SOLN     . Blood Glucose Monitoring Suppl (ONE TOUCH ULTRA 2) W/DEVICE KIT     . insulin detemir (LEVEMIR) 100 UNIT/ML injection Inject 15 Units into the skin at bedtime. Increasing by 1 unit until bs is below 130    . Insulin Pen Needle (PEN NEEDLES 31GX5/16") 31G X 8 MM MISC Use with pen qd 100 each 3  . Lancet Devices (ADJUSTABLE LANCING DEVICE) MISC     .  metFORMIN (GLUCOPHAGE) 1000 MG tablet TAKE 1 TABLET BY MOUTH TWICE A DAY WITH A MEAL 60 tablet 1  . ONE TOUCH ULTRA TEST test strip     . atenolol (TENORMIN) 50 MG tablet TAKE 1 TABLET BY MOUTH DAILY 30 tablet 3  . simvastatin (ZOCOR) 20 MG tablet TAKE 1 TABLET BY MOUTH AT BEDTIME (Patient not taking: Reported on 09/13/2014) 30 tablet 1   No current facility-administered medications on file prior to visit.   No Known Allergies History   Social History  . Marital Status: Married    Spouse Name: N/A    Number of Children: N/A  . Years of Education: N/A   Occupational History  . Not on file.   Social History Main Topics  . Smoking status: Former Smoker -- 25 years    Types: Cigarettes    Quit date: 10/13/1991  . Smokeless tobacco: Never Used  . Alcohol Use: No     Comment: occassional glass of wine  . Drug Use: No  . Sexual Activity: Yes   Other Topics Concern  . Not on file   Social History Narrative   Family History  Problem Relation Age of Onset  . Anesthesia problems Neg Hx   .  Hypotension Neg Hx   . Malignant hyperthermia Neg Hx   . Pseudochol deficiency Neg Hx   . Cancer Mother     Leukemia  . Leukemia Mother   . Asthma Father       Review of Systems  All other systems reviewed and are negative.      Objective:   Physical Exam  Cardiovascular: Normal rate, regular rhythm and normal heart sounds.   Pulmonary/Chest: Effort normal and breath sounds normal. No respiratory distress. He has no wheezes. He has no rales.  Abdominal: Soft. Bowel sounds are normal. He exhibits no distension. There is no tenderness. There is no rebound.  Musculoskeletal: He exhibits no edema.  Vitals reviewed.         Assessment & Plan:  Diabetes mellitus type II, uncontrolled  At the last office visit, the patient's hemoglobin A1c was 10.4 and uncontrolled. I had the patient discontinue NovoLog mix, Januvia, and glipizide. I had him start Levemir. I would like him to  increase Levemir to 30 units subcutaneous daily. I want the patient to increase Levemir by 1 unit every morning until fasting blood sugars are less than 130. He is instructed to call me in one week with his fasting and two-hour postprandial blood sugars. Continue aspirin and metformin. I also asked the patient to resume lisinopril and simvastatin. These medications were discontinued by accident.

## 2014-09-20 ENCOUNTER — Encounter (HOSPITAL_COMMUNITY): Payer: Self-pay | Admitting: Surgery

## 2014-10-02 ENCOUNTER — Telehealth: Payer: Self-pay | Admitting: *Deleted

## 2014-10-02 MED ORDER — INSULIN DETEMIR 100 UNIT/ML ~~LOC~~ SOLN: 15.0000 [IU] | Freq: Every day | SUBCUTANEOUS | Status: DC

## 2014-10-02 NOTE — Telephone Encounter (Signed)
Med refilled.

## 2014-10-29 ENCOUNTER — Other Ambulatory Visit: Payer: Self-pay | Admitting: Family Medicine

## 2014-10-29 NOTE — Telephone Encounter (Signed)
Refill appropriate and filled per protocol. 

## 2014-11-12 ENCOUNTER — Encounter: Payer: Self-pay | Admitting: Family

## 2014-11-13 ENCOUNTER — Other Ambulatory Visit: Payer: Self-pay | Admitting: Family Medicine

## 2014-11-13 ENCOUNTER — Encounter: Payer: Self-pay | Admitting: Family

## 2014-11-13 ENCOUNTER — Ambulatory Visit (INDEPENDENT_AMBULATORY_CARE_PROVIDER_SITE_OTHER): Payer: Medicare Other | Admitting: Family

## 2014-11-13 ENCOUNTER — Ambulatory Visit (HOSPITAL_COMMUNITY)
Admission: RE | Admit: 2014-11-13 | Discharge: 2014-11-13 | Disposition: A | Discharge status: Home / Self Care | Payer: Medicare Other | Source: Ambulatory Visit | Attending: Family | Admitting: Family

## 2014-11-13 VITALS — BP 134/78 | HR 69 | Resp 16 | Ht 65.0 in | Wt 172.0 lb

## 2014-11-13 DIAGNOSIS — I6523 Occlusion and stenosis of bilateral carotid arteries: Secondary | ICD-10-CM | POA: Diagnosis present

## 2014-11-13 DIAGNOSIS — Z48812 Encounter for surgical aftercare following surgery on the circulatory system: Secondary | ICD-10-CM

## 2014-11-13 DIAGNOSIS — Z87891 Personal history of nicotine dependence: Secondary | ICD-10-CM | POA: Diagnosis not present

## 2014-11-13 NOTE — Progress Notes (Signed)
Established Carotid Patient   History of Present Illness  [name] is a 81 y.o. male patient of Dr. Donnetta Hutching who is status post right carotid endarterectomy in 2008 and redo 2013. He was seen July 2014 with duplex suggesting recurrent severe stenosis in his right internal carotid artery after endarterectomy in 2013. He underwent catheter-based arteriogram at that same setting and this showed 50% stenosis with significant change in caliber from the endarterectomy site to the internal carotid artery.   Patient has Negative history of TIA or stroke symptom. The patient denies amaurosis fugax or monocular blindness. The patient denies facial drooping. Pt. denies hemiplegia. The patient denies receptive or expressive aphasia.   He denies claudication symptoms in legs with walking, denies non healing wounds.  Pt denies New Medical or Surgical History.  Pt Diabetic: Yes, states in fairly good control Pt smoker: former smoker, quit in 1993  Pt meds include: Statin : Yes ASA: Yes Other anticoagulants/antiplatelets: no   Past Medical History  Diagnosis Date  . Hyperlipidemia     takes Simvastatin daily  . Carotid artery occlusion   . Hypertension     takes Atenolol and Lisinopril daily  . Seasonal allergies   . Arthritis     knee  . Enlarged prostate     pt states no trouble with it  . Diabetes mellitus     metformin,januvia,and glipizide daily  . Impaired hearing, left     but doesn't wear hearing aids    Social History History  Substance Use Topics  . Smoking status: Former Smoker -- 25 years    Types: Cigarettes    Quit date: 10/13/1991  . Smokeless tobacco: Never Used  . Alcohol Use: No     Comment: occassional glass of wine    Family History Family History  Problem Relation Age of Onset  . Anesthesia problems Neg Hx   . Hypotension Neg Hx   . Malignant hyperthermia Neg Hx   . Pseudochol deficiency Neg Hx   . Cancer Mother     Leukemia  . Leukemia  Mother   . Asthma Father     Surgical History Past Surgical History  Procedure Laterality Date  . Carotidendartectomy  2008    right side  . Appendectomy      as a child  . Endarterectomy  03/04/2012    Procedure: ENDARTERECTOMY CAROTID;  Surgeon: Rosetta Posner, MD;  Location: [facility] OR;  Service: Vascular;  Laterality: Right;  Redo Right Carotid Endarterectomy with hemasheild patch angioplasty  . Carotid endarterectomy Right 2008     Re-do Mar 04, 2012  . Carotid angiogram N/A 05/09/2013    Procedure: CAROTID ANGIOGRAM;  Surgeon: Serafina Mitchell, MD;  Location: [facility] CATH LAB;  Service: Cardiovascular;  Laterality: N/A;    No Known Allergies  Current Outpatient Prescriptions  Medication Sig Dispense Refill  . aspirin EC 81 MG tablet Take 81 mg by mouth daily.    . ASSURE COMFORT LANCETS 30G MISC     . atenolol (TENORMIN) 50 MG tablet TAKE 1 TABLET BY MOUTH DAILY 30 tablet 11  . Blood Glucose Calibration (OT ULTRA/FASTTK CNTRL SOLN) SOLN     . Blood Glucose Monitoring Suppl (ONE TOUCH ULTRA 2) W/DEVICE KIT     . insulin detemir (LEVEMIR) 100 UNIT/ML injection Inject 0.15 mLs (15 Units total) into the skin at bedtime. Increasing by 1 unit until bs is below 130 10 mL 3  . Insulin Pen Needle (PEN NEEDLES 31GX5/16") 31G  X 8 MM MISC Use with pen qd 100 each 3  . Lancet Devices (ADJUSTABLE LANCING DEVICE) MISC     . lisinopril (PRINIVIL,ZESTRIL) 20 MG tablet Take 1 tablet (20 mg total) by mouth daily. 30 tablet 5  . metFORMIN (GLUCOPHAGE) 1000 MG tablet TAKE 1 TABLET TWICE A DAY WITH A MEAL 60 tablet 2  . ONE TOUCH ULTRA TEST test strip     . simvastatin (ZOCOR) 20 MG tablet TAKE 1 TABLET BY MOUTH AT BEDTIME 30 tablet 1   No current facility-administered medications for this visit.    Review of Systems : See HPI for pertinent positives and negatives.  Physical Examination  Filed Vitals:   11/13/14 1452 11/13/14 1453  BP: 114/65 134/78  Pulse: 73 69  Resp:  16  Height:  '5\' 5"'  (1.651 m)   Weight:  172 lb (78.019 kg)  SpO2:  97%   Body mass index is 28.62 kg/(m^2).  General: WDWN male in NAD GAIT: normal Eyes: PERRLA Pulmonary: Non-labored, CTAB, Negative Rales, Negative rhonchi, & Negative wheezing.  Cardiac: regular Rhythm, no detected murmur.  VASCULAR EXAM Carotid Bruits Right Left   Negative Negative   Aorta is not palpable. Radial pulses are 2+ palpable and equal.      Gastrointestinal: soft, nontender, BS WNL, no r/g, no palpable masses.  Musculoskeletal: Negative muscle atrophy/wasting. M/S 5/5 throughout, Extremities without ischemic changes.  Neurologic: A&O X 3; Appropriate Affect,  Speech is loud, CN 2-12 intact except is hard of hearing, Pain and light touch intact in extremities, Motor exam as listed above.    Non-Invasive Vascular Imaging CAROTID DUPLEX 11/13/2014   CEREBROVASCULAR DUPLEX EVALUATION    INDICATION: Carotid artery stenosis    PREVIOUS INTERVENTION(S): Right carotid endarterectomy 07/14/2007; Redo Right carotid endarterectomy 03/04/2012; Right carotid angiogram 03/04/2012    DUPLEX EXAM:     RIGHT  LEFT  Peak Systolic Velocities (cm/s) End Diastolic Velocities (cm/s) Plaque LOCATION Peak Systolic Velocities (cm/s) End Diastolic Velocities (cm/s) Plaque  67 0 HT CCA PROXIMAL 77 18   35 11 HT CCA MID 70 18 HT  63 15 HM CCA DISTAL 56 12 HT  316 43 HT ECA 172 25 HT  191 62 HM ICA PROXIMAL 229 69 HT  94 17  ICA MID 124 21   68 17  ICA DISTAL 57 17     Carotid endarterectomy  ICA / CCA Ratio (PSV) 3.3  Antegrade Vertebral Flow Antegrade  169 Brachial Systolic Pressure (mmHg) 678  Triphasic Brachial Artery Waveforms Triphasic    Plaque Morphology:  HM = Homogeneous, HT = Heterogeneous, CP = Calcific Plaque, SP = Smooth Plaque, IP = Irregular Plaque     ADDITIONAL FINDINGS:      IMPRESSION: Right common carotid artery disease present with dampened end diastolic velocities suggestive of a more proximal disease process than can be visualized. Right internal carotid artery stenosis present with history of carotid endarterectomy in the 40%-59% range. Left internal carotid artery stenosis present in the 60%-79% range. Bilateral external carotid artery stenosis.     Compared to the previous exam:  Stable on the right and increased on the left since study on 05/15/2014.      Assessment: [name] is a 81 y.o. male who presents with asymptomatic right common carotid artery disease present with dampened end diastolic velocities suggestive of a more proximal disease process than can be visualized. Right internal carotid artery stenosis present with history of carotid endarterectomy in the 40%-59% range.  Left internal carotid artery stenosis present in the 60%-79% range. Stable on the right and increased stenosis on the left since study on 05/15/2014.   Plan: Follow-up in 6 months with Carotid Duplex.   I discussed in depth with the patient the nature of atherosclerosis, and emphasized the importance of maximal medical management including strict control of blood pressure, blood glucose, and lipid levels, obtaining regular exercise, and continued cessation of smoking.  The patient is aware that without maximal medical management the underlying atherosclerotic disease process will progress, limiting the benefit of any interventions. The patient was given information about stroke prevention and what symptoms should prompt the patient to seek immediate medical care. Thank you for allowing [place] to participate in this patient's care.  Clemon Chambers, RN, MSN, FNP-C [facility] Office: [phone]  Clinic Physician: Kellie Simmering  11/13/2014  3:05 PM

## 2014-11-13 NOTE — Patient Instructions (Signed)
Stroke Prevention Some medical conditions and behaviors are associated with an increased chance of having a stroke. You may prevent a stroke by making healthy choices and managing medical conditions. HOW CAN I REDUCE MY RISK OF HAVING A STROKE?   Stay physically active. Get at least 30 minutes of activity on most or all days.  Do not smoke. It may also be helpful to avoid exposure to secondhand smoke.  Limit alcohol use. Moderate alcohol use is considered to be:  No more than 2 drinks per day for men.  No more than 1 drink per day for nonpregnant women.  Eat healthy foods. This involves:  Eating 5 or more servings of fruits and vegetables a day.  Making dietary changes that address high blood pressure (hypertension), high cholesterol, diabetes, or obesity.  Manage your cholesterol levels.  Making food choices that are high in fiber and low in saturated fat, trans fat, and cholesterol may control cholesterol levels.  Take any prescribed medicines to control cholesterol as directed by your health care provider.  Manage your diabetes.  Controlling your carbohydrate and sugar intake is recommended to manage diabetes.  Take any prescribed medicines to control diabetes as directed by your health care provider.  Control your hypertension.  Making food choices that are low in salt (sodium), saturated fat, trans fat, and cholesterol is recommended to manage hypertension.  Take any prescribed medicines to control hypertension as directed by your health care provider.  Maintain a healthy weight.  Reducing calorie intake and making food choices that are low in sodium, saturated fat, trans fat, and cholesterol are recommended to manage weight.  Stop drug abuse.  Avoid taking birth control pills.  Talk to your health care provider about the risks of taking birth control pills if you are over 35 years old, smoke, get migraines, or have ever had a blood clot.  Get evaluated for sleep  disorders (sleep apnea).  Talk to your health care provider about getting a sleep evaluation if you snore a lot or have excessive sleepiness.  Take medicines only as directed by your health care provider.  For some people, aspirin or blood thinners (anticoagulants) are helpful in reducing the risk of forming abnormal blood clots that can lead to stroke. If you have the irregular heart rhythm of atrial fibrillation, you should be on a blood thinner unless there is a good reason you cannot take them.  Understand all your medicine instructions.  Make sure that other conditions (such as anemia or atherosclerosis) are addressed. SEEK IMMEDIATE MEDICAL CARE IF:   You have sudden weakness or numbness of the face, arm, or leg, especially on one side of the body.  Your face or eyelid droops to one side.  You have sudden confusion.  You have trouble speaking (aphasia) or understanding.  You have sudden trouble seeing in one or both eyes.  You have sudden trouble walking.  You have dizziness.  You have a loss of balance or coordination.  You have a sudden, severe headache with no known cause.  You have new chest pain or an irregular heartbeat. Any of these symptoms may represent a serious problem that is an emergency. Do not wait to see if the symptoms will go away. Get medical help at once. Call your local emergency services (911 in [place] Do not drive yourself to the hospital. Document Released: 11/05/2004 Document Revised: 02/12/2014 Document Reviewed: 03/31/2013 Exit[employer] Patient Information 2015 [employer]. This information is not intended to replace advice given   to you by your health care provider. Make sure you discuss any questions you have with your health care provider.  

## 2014-11-14 ENCOUNTER — Other Ambulatory Visit: Payer: Self-pay | Admitting: *Deleted

## 2014-11-14 DIAGNOSIS — I6523 Occlusion and stenosis of bilateral carotid arteries: Secondary | ICD-10-CM

## 2015-02-11 ENCOUNTER — Other Ambulatory Visit: Payer: Self-pay | Admitting: Family Medicine

## 2015-03-10 ENCOUNTER — Other Ambulatory Visit: Payer: Self-pay | Admitting: Family Medicine

## 2015-03-24 ENCOUNTER — Other Ambulatory Visit: Payer: Self-pay | Admitting: Family Medicine

## 2015-05-16 ENCOUNTER — Other Ambulatory Visit: Payer: Self-pay | Admitting: Family Medicine

## 2015-05-16 NOTE — Telephone Encounter (Signed)
Requires office visit before any further refills can be given.  

## 2015-05-28 ENCOUNTER — Ambulatory Visit: Payer: Medicare Other | Admitting: Family

## 2015-05-28 ENCOUNTER — Other Ambulatory Visit (HOSPITAL_COMMUNITY): Payer: Medicare Other

## 2015-05-28 ENCOUNTER — Ambulatory Visit (INDEPENDENT_AMBULATORY_CARE_PROVIDER_SITE_OTHER): Payer: Medicare Other | Admitting: Family Medicine

## 2015-05-28 ENCOUNTER — Encounter: Payer: Self-pay | Admitting: Family Medicine

## 2015-05-28 VITALS — BP 144/80 | HR 84 | Temp 97.7°F | Resp 16 | Ht 65.0 in | Wt 174.0 lb

## 2015-05-28 DIAGNOSIS — I10 Essential (primary) hypertension: Secondary | ICD-10-CM | POA: Diagnosis not present

## 2015-05-28 DIAGNOSIS — E785 Hyperlipidemia, unspecified: Secondary | ICD-10-CM | POA: Diagnosis not present

## 2015-05-28 DIAGNOSIS — IMO0001 Reserved for inherently not codable concepts without codable children: Secondary | ICD-10-CM

## 2015-05-28 DIAGNOSIS — I2510 Atherosclerotic heart disease of native coronary artery without angina pectoris: Secondary | ICD-10-CM

## 2015-05-28 DIAGNOSIS — E1165 Type 2 diabetes mellitus with hyperglycemia: Principal | ICD-10-CM

## 2015-05-28 DIAGNOSIS — Z794 Long term (current) use of insulin: Principal | ICD-10-CM

## 2015-05-28 DIAGNOSIS — E1065 Type 1 diabetes mellitus with hyperglycemia: Secondary | ICD-10-CM | POA: Diagnosis not present

## 2015-05-28 LAB — COMPLETE METABOLIC PANEL WITH GFR
ALBUMIN: 4 g/dL (ref 3.6–5.1)
ALK PHOS: 65 U/L (ref 40–115)
ALT: 16 U/L (ref 9–46)
AST: 15 U/L (ref 10–35)
BILIRUBIN TOTAL: 1 mg/dL (ref 0.2–1.2)
BUN: 17 mg/dL (ref 7–25)
CO2: 26 mmol/L (ref 20–31)
Calcium: 9.3 mg/dL (ref 8.6–10.3)
Chloride: 106 mmol/L (ref 98–110)
Creat: 1.1 mg/dL (ref 0.70–1.11)
GFR, EST NON AFRICAN AMERICAN: 62 mL/min (ref >=60)
GFR, Est African American: 72 mL/min (ref >=60)
Glucose, Bld: 180 mg/dL — ABNORMAL HIGH (ref 70–99)
Potassium: 4.3 mmol/L (ref 3.5–5.3)
Sodium: 140 mmol/L (ref 135–146)
TOTAL PROTEIN: 6.7 g/dL (ref 6.1–8.1)

## 2015-05-28 LAB — LIPID PANEL
Cholesterol: 123 mg/dL — ABNORMAL LOW (ref 125–200)
HDL: 28 mg/dL — AB (ref >=40)
LDL Cholesterol: 64 mg/dL (ref <130)
TRIGLYCERIDES: 157 mg/dL — AB (ref <150)
Total CHOL/HDL Ratio: 4.4 Ratio (ref <=5.0)
VLDL: 31 mg/dL — ABNORMAL HIGH (ref <30)

## 2015-05-28 LAB — CBC WITH DIFFERENTIAL/PLATELET
Basophils Absolute: 0.1 10*3/uL (ref 0.0–0.1)
Basophils Relative: 1 % (ref 0–1)
EOS ABS: 0.1 10*3/uL (ref 0.0–0.7)
Eosinophils Relative: 2 % (ref 0–5)
HCT: 46.5 % (ref 39.0–52.0)
HEMOGLOBIN: 15.4 g/dL (ref 13.0–17.0)
LYMPHS ABS: 2.5 10*3/uL (ref 0.7–4.0)
Lymphocytes Relative: 41 % (ref 12–46)
MCH: 30.6 pg (ref 26.0–34.0)
MCHC: 33.1 g/dL (ref 30.0–36.0)
MCV: 92.3 fL (ref 78.0–100.0)
MONOS PCT: 10 % (ref 3–12)
MPV: 9.3 fL (ref 8.6–12.4)
Monocytes Absolute: 0.6 10*3/uL (ref 0.1–1.0)
NEUTROS PCT: 46 % (ref 43–77)
Neutro Abs: 2.9 10*3/uL (ref 1.7–7.7)
PLATELETS: 160 10*3/uL (ref 150–400)
RBC: 5.04 MIL/uL (ref 4.22–5.81)
RDW: 13.8 % (ref 11.5–15.5)
WBC: 6.2 10*3/uL (ref 4.0–10.5)

## 2015-05-28 LAB — HEMOGLOBIN A1C
Hgb A1c MFr Bld: 9 % — ABNORMAL HIGH (ref <5.7)
Mean Plasma Glucose: 212 mg/dL — ABNORMAL HIGH (ref <117)

## 2015-05-28 MED ORDER — "PEN NEEDLES 5/16"" 31G X 8 MM MISC": Status: DC

## 2015-05-28 NOTE — Progress Notes (Signed)
Subjective:    Patient ID: [name], male    DOB: [birth year], 81 y.o.   MRN: [id number]  HPI Patient is here today for follow-up of his insulin-dependent diabetes mellitus. He is not checking his sugars regularly but he states that his fasting blood sugars in the morning are typically around 140. He denies any sugars greater than 200. He denies any hypoglycemic episodes. Patiently saw his vascular surgeon in February. At that time carotid Dopplers revealed a 49-60% blockage in the right carotid artery as well as a 60-79% blockage in the left carotid artery. At the present time they are surveilling the patient once a year with carotid Dopplers. He is compliant with aspirin 81 mg by mouth daily. He is compliant with his blood pressure medication. His blood pressure today is borderline but acceptable given his age. He denies any chest pain shortness of breath or dyspnea on exertion. He denies any numbness or tingling in the feet. He denies any neuropathy. Pneumonia vaccine is up-to-date. Diabetic foot exam is up-to-date. However the patient is overdue for diabetic eye exam. He declined to see an ophthalmologist but after an extensive discussion I was able to convince the patient to go for annual diabetic eye exam Past Medical History  Diagnosis Date  . Hyperlipidemia     takes Simvastatin daily  . Carotid artery occlusion   . Hypertension     takes Atenolol and Lisinopril daily  . Seasonal allergies   . Arthritis     knee  . Enlarged prostate     pt states no trouble with it  . Diabetes mellitus     metformin,januvia,and glipizide daily  . Impaired hearing, left     but doesn't wear hearing aids   Past Surgical History  Procedure Laterality Date  . Carotidendartectomy  2008    right side  . Appendectomy      as a child  . Endarterectomy  03/04/2012    Procedure: ENDARTERECTOMY CAROTID;  Surgeon: Rosetta Posner, MD;  Location: [facility] OR;  Service: Vascular;  Laterality: Right;  Redo Right  Carotid Endarterectomy with hemasheild patch angioplasty  . Carotid endarterectomy Right 2008     Re-do Mar 04, 2012  . Carotid angiogram N/A 05/09/2013    Procedure: CAROTID ANGIOGRAM;  Surgeon: Serafina Mitchell, MD;  Location: [facility] CATH LAB;  Service: Cardiovascular;  Laterality: N/A;   Current Outpatient Prescriptions on File Prior to Visit  Medication Sig Dispense Refill  . aspirin EC 81 MG tablet Take 81 mg by mouth daily.    . ASSURE COMFORT LANCETS 30G MISC     . atenolol (TENORMIN) 50 MG tablet TAKE 1 TABLET BY MOUTH DAILY 30 tablet 11  . Blood Glucose Calibration (OT ULTRA/FASTTK CNTRL SOLN) SOLN     . Blood Glucose Monitoring Suppl (ONE TOUCH ULTRA 2) W/DEVICE KIT     . Lancet Devices (ADJUSTABLE LANCING DEVICE) MISC     . LEVEMIR 100 UNIT/ML injection INJECT 15 UNITS SUBCUTANEOUSLY AT BEDTIME . INCREASE BY 1 UNIT UNTIL BS IS BELOW 130 (Patient taking differently: INJECT 13-14 UNITS SUBCUTANEOUSLY AT BEDTIME . INCREASE BY 1 UNIT UNTIL BS IS BELOW 130) 10 mL 3  . lisinopril (PRINIVIL,ZESTRIL) 20 MG tablet TAKE 1 TABLET (20 MG TOTAL) BY MOUTH DAILY. 30 tablet 5  . metFORMIN (GLUCOPHAGE) 1000 MG tablet TAKE 1 TABLET TWICE A DAY WITH A MEAL 60 tablet 0  . ONE TOUCH ULTRA TEST test strip     . simvastatin (  ZOCOR) 20 MG tablet TAKE 1 TABLET BY MOUTH AT BEDTIME 30 tablet 1   No current facility-administered medications on file prior to visit.   No Known Allergies Social History   Social History  . Marital Status: Married    Spouse Name: N/A  . Number of Children: N/A  . Years of Education: N/A   Occupational History  . Not on file.   Social History Main Topics  . Smoking status: Former Smoker -- 25 years    Types: Cigarettes    Quit date: 10/13/1991  . Smokeless tobacco: Never Used  . Alcohol Use: No     Comment: occassional glass of wine  . Drug Use: No  . Sexual Activity: Yes   Other Topics Concern  . Not on file   Social History Narrative      Review of Systems    All other systems reviewed and are negative.      Objective:   Physical Exam  Constitutional: He appears well-developed and well-nourished.  Neck: Neck supple. No JVD present. No thyromegaly present.  Cardiovascular: Normal rate, regular rhythm and normal heart sounds.   No murmur heard. Pulmonary/Chest: Effort normal and breath sounds normal. No respiratory distress. He has no wheezes. He has no rales. He exhibits no tenderness.  Abdominal: Soft. Bowel sounds are normal. He exhibits no distension and no mass. There is no tenderness. There is no rebound and no guarding.  Musculoskeletal: He exhibits no edema.  Lymphadenopathy:    He has no cervical adenopathy.  Vitals reviewed.         Assessment & Plan:  Diabetes mellitus, insulin dependent (IDDM), uncontrolled - Plan: CBC with Differential/Platelet, COMPLETE METABOLIC PANEL WITH GFR, Lipid panel, Hemoglobin A1c, Microalbumin, urine, Ambulatory referral to Ophthalmology  Essential hypertension  HLD (hyperlipidemia)  ASCVD (arteriosclerotic cardiovascular disease)  Patient's blood pressures acceptable given his advanced age. I would like to check a hemoglobin A1c today. Goal hemoglobin A1c given his age would be between 7 and 8 although I would like to give the patient is close to 7 as possible. I will check a fasting lipid panel. Goal LDL cholesterol is less than 70. I will schedule the patient for diabetic eye exam with an ophthalmologist. The remainder of his preventative care is up-to-date.

## 2015-06-05 ENCOUNTER — Encounter: Payer: Self-pay | Admitting: Family

## 2015-06-06 ENCOUNTER — Other Ambulatory Visit: Payer: Self-pay | Admitting: Family

## 2015-06-06 ENCOUNTER — Ambulatory Visit (INDEPENDENT_AMBULATORY_CARE_PROVIDER_SITE_OTHER): Payer: Medicare Other | Admitting: Family

## 2015-06-06 ENCOUNTER — Ambulatory Visit (HOSPITAL_COMMUNITY)
Admission: RE | Admit: 2015-06-06 | Discharge: 2015-06-06 | Disposition: A | Discharge status: Home / Self Care | Payer: Medicare Other | Source: Ambulatory Visit | Attending: Family | Admitting: Family

## 2015-06-06 ENCOUNTER — Encounter: Payer: Self-pay | Admitting: Family

## 2015-06-06 VITALS — BP 153/73 | HR 66 | Temp 97.2°F | Resp 16 | Ht 64.0 in | Wt 172.0 lb

## 2015-06-06 DIAGNOSIS — Z87891 Personal history of nicotine dependence: Secondary | ICD-10-CM

## 2015-06-06 DIAGNOSIS — I6522 Occlusion and stenosis of left carotid artery: Secondary | ICD-10-CM | POA: Diagnosis not present

## 2015-06-06 DIAGNOSIS — Z9889 Other specified postprocedural states: Secondary | ICD-10-CM

## 2015-06-06 DIAGNOSIS — Z48812 Encounter for surgical aftercare following surgery on the circulatory system: Secondary | ICD-10-CM | POA: Insufficient documentation

## 2015-06-06 DIAGNOSIS — I6523 Occlusion and stenosis of bilateral carotid arteries: Secondary | ICD-10-CM | POA: Insufficient documentation

## 2015-06-06 NOTE — Progress Notes (Signed)
Established Carotid Patient   History of Present Illness  [name] is a 81 y.o. male patient of Dr. Donnetta Hutching who is status post right carotid endarterectomy in 2008 and redo 2013. He was seen July 2014 with duplex suggesting recurrent severe stenosis in his right internal carotid artery after endarterectomy in 2013. He underwent catheter-based arteriogram at that same setting and this showed 50% stenosis with significant change in caliber from the endarterectomy site to the internal carotid artery.   Patient has no history of TIA or stroke symptoms. Specifically the patient denies a history of amaurosis fugax or monocular blindness, unilateral facial drooping, hemiplegia, and receptive or expressive aphasia.   He denies claudication symptoms in legs with walking, denies non healing wounds.  Pt reports his hearing loss is a result of working on [employer].   Pt denies New Medical or Surgical History.  Pt Diabetic: Yes, review of records: A1C on 05/28/15 was 9.0, states he was started on insulin about May of 2016 Pt smoker: former smoker, quit in 1993  Pt meds include: Statin : no Beta blocker: yes ASA: Yes Other anticoagulants/antiplatelets: no  Past Medical History  Diagnosis Date  . Hyperlipidemia     takes Simvastatin daily  . Carotid artery occlusion   . Hypertension     takes Atenolol and Lisinopril daily  . Seasonal allergies   . Arthritis     knee  . Enlarged prostate     pt states no trouble with it  . Diabetes mellitus     metformin,januvia,and glipizide daily  . Impaired hearing, left     but doesn't wear hearing aids    Social History Social History  Substance Use Topics  . Smoking status: Former Smoker -- 25 years    Types: Cigarettes    Quit date: 10/13/1991  . Smokeless tobacco: Never Used  . Alcohol Use: No     Comment: occassional glass of wine    Family History Family History  Problem Relation Age of Onset  . Anesthesia problems Neg Hx    . Hypotension Neg Hx   . Malignant hyperthermia Neg Hx   . Pseudochol deficiency Neg Hx   . Cancer Mother     Leukemia  . Leukemia Mother   . Asthma Father     Surgical History Past Surgical History  Procedure Laterality Date  . Carotidendartectomy  2008    right side  . Appendectomy      as a child  . Endarterectomy  03/04/2012    Procedure: ENDARTERECTOMY CAROTID;  Surgeon: Rosetta Posner, MD;  Location: [facility] OR;  Service: Vascular;  Laterality: Right;  Redo Right Carotid Endarterectomy with hemasheild patch angioplasty  . Carotid endarterectomy Right 2008     Re-do Mar 04, 2012  . Carotid angiogram N/A 05/09/2013    Procedure: CAROTID ANGIOGRAM;  Surgeon: Serafina Mitchell, MD;  Location: [facility] CATH LAB;  Service: Cardiovascular;  Laterality: N/A;    No Known Allergies  Current Outpatient Prescriptions  Medication Sig Dispense Refill  . aspirin EC 81 MG tablet Take 81 mg by mouth daily.    [name] Kitchen atenolol (TENORMIN) 50 MG tablet TAKE 1 TABLET BY MOUTH DAILY 30 tablet 11  . LEVEMIR 100 UNIT/ML injection INJECT 15 UNITS SUBCUTANEOUSLY AT BEDTIME . INCREASE BY 1 UNIT UNTIL BS IS BELOW 130 (Patient taking differently: INJECT 14-15  UNITS SUBCUTANEOUSLY AT BEDTIME . INCREASE BY 1 UNIT UNTIL BS IS BELOW 130) 10 mL 3  . lisinopril (PRINIVIL,ZESTRIL) 20  MG tablet TAKE 1 TABLET (20 MG TOTAL) BY MOUTH DAILY. 30 tablet 5  . metFORMIN (GLUCOPHAGE) 1000 MG tablet TAKE 1 TABLET TWICE A DAY WITH A MEAL 60 tablet 0  . ASSURE COMFORT LANCETS 30G MISC     . Blood Glucose Calibration (OT ULTRA/FASTTK CNTRL SOLN) SOLN     . Blood Glucose Monitoring Suppl (ONE TOUCH ULTRA 2) W/DEVICE KIT     . Insulin Pen Needle (PEN NEEDLES 31GX5/16") 31G X 8 MM MISC Use with pen qd (Patient not taking: Reported on 06/06/2015) 100 each 5  . Lancet Devices (ADJUSTABLE LANCING DEVICE) MISC     . ONE TOUCH ULTRA TEST test strip     . simvastatin (ZOCOR) 20 MG tablet TAKE 1 TABLET BY MOUTH AT BEDTIME (Patient not taking: Reported  on 06/06/2015) 30 tablet 1   No current facility-administered medications for this visit.    Review of Systems : See HPI for pertinent positives and negatives.  Physical Examination  Filed Vitals:   06/06/15 1345 06/06/15 1347  BP: 138/74 145/81  Pulse: 60 66  Temp:  97.2 F (36.2 C)  TempSrc:  Oral  Resp:  16  Height:  $Remove'5\' 4"'[id number]$  (1.626 m)  Weight:  172 lb (78.019 kg)  SpO2:  99%   Body mass index is 29.51 kg/(m^2).  General: WDWN male in NAD GAIT: normal Eyes: PERRLA Pulmonary: Non-labored, CTAB, Negative Rales, Negative rhonchi, & Negative wheezing.  Cardiac: regular Rhythm, no detected murmur.  VASCULAR EXAM Carotid Bruits Right Left   Negative Negative   Aorta is not palpable. Radial pulses are 2+ palpable and equal. Pedal pulses are palpable.      Gastrointestinal: soft, nontender, BS WNL, no r/g, no palpable masses.  Musculoskeletal: Negative muscle atrophy/wasting. M/S 5/5 throughout, Extremities without ischemic changes.  Neurologic: A&O X 3; Appropriate Affect,  Speech is loud, CN 2-12 intact except is hard of hearing, Pain and light touch intact in extremities, Motor exam as listed above.          Non-Invasive Vascular Imaging CAROTID DUPLEX 06/06/2015   CEREBROVASCULAR DUPLEX EVALUATION    INDICATION: Carotid artery stenosis    PREVIOUS INTERVENTION(S): Right carotid endarterectomy 07/14/2007;Redo right carotid endarterectomy 03/04/2012    DUPLEX EXAM:     RIGHT  LEFT  Peak Systolic Velocities (cm/s) End Diastolic Velocities (cm/s) Plaque LOCATION Peak Systolic Velocities (cm/s) End Diastolic Velocities (cm/s) Plaque  29 9 HT CCA PROXIMAL 75 14   37 8 HT CCA MID 65 14   61 15 HT CCA DISTAL 56 13 HT  122 16 HT ECA 140 17 HT  164 46 HT ICA PROXIMAL 223 57 HT  122 21  ICA MID 62 13 HT  80 20  ICA  DISTAL 61 18     carotid endarterectomy ICA / CCA Ratio (PSV) 3.43  Antegrade Vertebral Flow Antegrade  308 Brachial Systolic Pressure (mmHg) 657  Triphasic Brachial Artery Waveforms Triphasic    Plaque Morphology:  HM = Homogeneous, HT = Heterogeneous, CP = Calcific Plaque, SP = Smooth Plaque, IP = Irregular Plaque     ADDITIONAL FINDINGS: Right subclavian artery PSV105cm/sec; Left subclavian artery PSV101cm/sec    IMPRESSION: Right common carotid artery is patent with dampened end diastolic velocities suggestive of a more proximal disease process than can be visualized. Right internal carotid artery stenosis present in the 40%-59% range with history of carotid endarterectomy, mild to moderate hyperplasia present involving the distal patch. Left internal carotid artery stenosis present in the  40%-59% range. Right external carotid artery stenosis present, dampened velocities present most likely due to severity of stenosis.    Compared to the previous exam:  Stable on the right and classification downgraded on the left since previous study on 11/13/2014.      Assessment: TOMAS SCHAMP is a 81 y.o. male who is status post right carotid endarterectomy in 2008 and redo 2013. He was seen July 2014 with duplex suggesting recurrent severe stenosis in his right internal carotid artery after endarterectomy in 2013. He underwent catheter-based arteriogram at that same setting and this showed 50% stenosis with significant change in caliber from the endarterectomy site to the internal carotid artery. He has no history of stroke or TIA.  Today's carotid Duplex suggests 40-59% bilateral ICA stenosis. Stable on the right and classification downgraded (improved) on the left since previous study on 11/13/2014. He remains physically active. His primary atherosclerotic risk factor remains uncontrolled DM but he is working closely with his provider that helps him manage his DM, he was recently started on  insulin.   Plan: Follow-up in 6 months with Carotid Duplex.   I discussed in depth with the patient the nature of atherosclerosis, and emphasized the importance of maximal medical management including strict control of blood pressure, blood glucose, and lipid levels, obtaining regular exercise, and continued cessation of smoking.  The patient is aware that without maximal medical management the underlying atherosclerotic disease process will progress, limiting the benefit of any interventions. The patient was given information about stroke prevention and what symptoms should prompt the patient to seek immediate medical care. Thank you for allowing [place] to participate in this patient's care.  Clemon Chambers, RN, MSN, FNP-C [facility] Office: [phone]  Clinic Physician: Scot Dock on call  06/06/2015 1:53 PM

## 2015-06-06 NOTE — Progress Notes (Signed)
Filed Vitals:   06/06/15 1345 06/06/15 1347 06/06/15 1353  BP: 138/74 145/81 153/73  Pulse: 60 66 66  Temp:  97.2 F (36.2 C)   TempSrc:  Oral   Resp:  16   Height:  5\' 4"  (1.626 m)   Weight:  172 lb (78.019 kg)   SpO2:  99%

## 2015-06-06 NOTE — Addendum Note (Signed)
Addended by: Dorthula Rue L on: 06/06/2015 03:35 PM   Modules accepted: Orders

## 2015-06-06 NOTE — Patient Instructions (Signed)
Stroke Prevention Some medical conditions and behaviors are associated with an increased chance of having a stroke. You may prevent a stroke by making healthy choices and managing medical conditions. HOW CAN I REDUCE MY RISK OF HAVING A STROKE?   Stay physically active. Get at least 30 minutes of activity on most or all days.  Do not smoke. It may also be helpful to avoid exposure to secondhand smoke.  Limit alcohol use. Moderate alcohol use is considered to be:  No more than 2 drinks per day for men.  No more than 1 drink per day for nonpregnant women.  Eat healthy foods. This involves:  Eating 5 or more servings of fruits and vegetables a day.  Making dietary changes that address high blood pressure (hypertension), high cholesterol, diabetes, or obesity.  Manage your cholesterol levels.  Making food choices that are high in fiber and low in saturated fat, trans fat, and cholesterol may control cholesterol levels.  Take any prescribed medicines to control cholesterol as directed by your health care provider.  Manage your diabetes.  Controlling your carbohydrate and sugar intake is recommended to manage diabetes.  Take any prescribed medicines to control diabetes as directed by your health care provider.  Control your hypertension.  Making food choices that are low in salt (sodium), saturated fat, trans fat, and cholesterol is recommended to manage hypertension.  Take any prescribed medicines to control hypertension as directed by your health care provider.  Maintain a healthy weight.  Reducing calorie intake and making food choices that are low in sodium, saturated fat, trans fat, and cholesterol are recommended to manage weight.  Stop drug abuse.  Avoid taking birth control pills.  Talk to your health care provider about the risks of taking birth control pills if you are over 35 years old, smoke, get migraines, or have ever had a blood clot.  Get evaluated for sleep  disorders (sleep apnea).  Talk to your health care provider about getting a sleep evaluation if you snore a lot or have excessive sleepiness.  Take medicines only as directed by your health care provider.  For some people, aspirin or blood thinners (anticoagulants) are helpful in reducing the risk of forming abnormal blood clots that can lead to stroke. If you have the irregular heart rhythm of atrial fibrillation, you should be on a blood thinner unless there is a good reason you cannot take them.  Understand all your medicine instructions.  Make sure that other conditions (such as anemia or atherosclerosis) are addressed. SEEK IMMEDIATE MEDICAL CARE IF:   You have sudden weakness or numbness of the face, arm, or leg, especially on one side of the body.  Your face or eyelid droops to one side.  You have sudden confusion.  You have trouble speaking (aphasia) or understanding.  You have sudden trouble seeing in one or both eyes.  You have sudden trouble walking.  You have dizziness.  You have a loss of balance or coordination.  You have a sudden, severe headache with no known cause.  You have new chest pain or an irregular heartbeat. Any of these symptoms may represent a serious problem that is an emergency. Do not wait to see if the symptoms will go away. Get medical help at once. Call your local emergency services (911 in [place] Do not drive yourself to the hospital. Document Released: 11/05/2004 Document Revised: 02/12/2014 Document Reviewed: 03/31/2013 Exit[employer] Patient Information 2015 [employer]. This information is not intended to replace advice given   to you by your health care provider. Make sure you discuss any questions you have with your health care provider.  

## 2015-06-16 ENCOUNTER — Other Ambulatory Visit: Payer: Self-pay | Admitting: Family Medicine

## 2015-08-21 ENCOUNTER — Telehealth: Payer: Self-pay | Admitting: Family Medicine

## 2015-08-21 NOTE — Telephone Encounter (Signed)
Patient left BP readings for provider to review.  Per provider BP readings are great and we afe making no changes to his current treatment.  Pt called and made aware.  BP readings sent to scan.

## 2015-09-11 ENCOUNTER — Other Ambulatory Visit: Payer: Self-pay | Admitting: Family Medicine

## 2015-11-06 ENCOUNTER — Other Ambulatory Visit: Payer: Self-pay | Admitting: Family Medicine

## 2015-12-05 ENCOUNTER — Encounter: Payer: Self-pay | Admitting: Family

## 2015-12-08 ENCOUNTER — Other Ambulatory Visit: Payer: Self-pay | Admitting: Family Medicine

## 2015-12-12 ENCOUNTER — Ambulatory Visit (INDEPENDENT_AMBULATORY_CARE_PROVIDER_SITE_OTHER): Payer: PPO | Admitting: Family

## 2015-12-12 ENCOUNTER — Ambulatory Visit: Payer: Medicare Other | Admitting: Family

## 2015-12-12 ENCOUNTER — Encounter: Payer: Self-pay | Admitting: Family

## 2015-12-12 ENCOUNTER — Ambulatory Visit (HOSPITAL_COMMUNITY)
Admission: RE | Admit: 2015-12-12 | Discharge: 2015-12-12 | Disposition: A | Discharge status: Home / Self Care | Payer: PPO | Source: Ambulatory Visit | Attending: Family | Admitting: Family

## 2015-12-12 ENCOUNTER — Encounter (HOSPITAL_COMMUNITY): Payer: Medicare Other

## 2015-12-12 VITALS — BP 137/77 | HR 70 | Ht 64.0 in | Wt 175.4 lb

## 2015-12-12 DIAGNOSIS — I10 Essential (primary) hypertension: Secondary | ICD-10-CM | POA: Insufficient documentation

## 2015-12-12 DIAGNOSIS — Z87891 Personal history of nicotine dependence: Secondary | ICD-10-CM | POA: Insufficient documentation

## 2015-12-12 DIAGNOSIS — E119 Type 2 diabetes mellitus without complications: Secondary | ICD-10-CM | POA: Insufficient documentation

## 2015-12-12 DIAGNOSIS — I6523 Occlusion and stenosis of bilateral carotid arteries: Secondary | ICD-10-CM | POA: Diagnosis not present

## 2015-12-12 DIAGNOSIS — Z9889 Other specified postprocedural states: Secondary | ICD-10-CM | POA: Insufficient documentation

## 2015-12-12 DIAGNOSIS — E785 Hyperlipidemia, unspecified: Secondary | ICD-10-CM | POA: Diagnosis not present

## 2015-12-12 DIAGNOSIS — I6522 Occlusion and stenosis of left carotid artery: Secondary | ICD-10-CM | POA: Diagnosis not present

## 2015-12-12 NOTE — Progress Notes (Signed)
Chief Complaint: Extracranial Carotid Artery Stenosis   History of Present Illness  [name] is a 82 y.o. male patient of Dr. Donnetta Hutching who is status post right carotid endarterectomy in 2008 and redo 2013. He was seen July 2014 with duplex suggesting recurrent severe stenosis in his right internal carotid artery after endarterectomy in 2013. He underwent catheter-based arteriogram at that same setting and this showed 50% stenosis with significant change in caliber from the endarterectomy site to the internal carotid artery.   Patient has no history of TIA or stroke symptoms. Specifically the patient denies a history of amaurosis fugax or monocular blindness, unilateral facial drooping, hemiplegia, or receptive or expressive aphasia.   He denies claudication symptoms in legs with walking, denies non healing wounds.  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   Pt denies New Medical or Surgical History.  Pt Diabetic: Yes, review of records: A1C on 05/28/15 was 9.0, states he was started on insulin about May of 2016 Pt smoker: former smoker, quit in 1993  Pt meds include: Statin : yes Beta blocker: yes ASA: Yes Other anticoagulants/antiplatelets: no    Past Medical History  Diagnosis Date  . Hyperlipidemia     takes Simvastatin daily  . Carotid artery occlusion   . Hypertension     takes Atenolol and Lisinopril daily  . Seasonal allergies   . Arthritis     knee  . Enlarged prostate     pt states no trouble with it  . Diabetes mellitus     metformin,januvia,and glipizide daily  . Impaired hearing, left     but doesn't wear hearing aids    Social History Social History  Substance Use Topics  . Smoking status: Former Smoker -- 25 years    Types: Cigarettes    Quit date: 10/13/1991  . Smokeless tobacco: Never Used  . Alcohol Use: No     Comment: occassional glass of wine    Family History Family  History  Problem Relation Age of Onset  . Anesthesia problems Neg Hx   . Hypotension Neg Hx   . Malignant hyperthermia Neg Hx   . Pseudochol deficiency Neg Hx   . Cancer Mother     Leukemia  . Leukemia Mother   . Asthma Father     Surgical History Past Surgical History  Procedure Laterality Date  . Carotidendartectomy  2008    right side  . Appendectomy      as a child  . Endarterectomy  03/04/2012    Procedure: ENDARTERECTOMY CAROTID;  Surgeon: Rosetta Posner, MD;  Location: [facility] OR;  Service: Vascular;  Laterality: Right;  Redo Right Carotid Endarterectomy with hemasheild patch angioplasty  . Carotid endarterectomy Right 2008     Re-do Mar 04, 2012  . Carotid angiogram N/A 05/09/2013    Procedure: CAROTID ANGIOGRAM;  Surgeon: Serafina Mitchell, MD;  Location: [facility] CATH LAB;  Service: Cardiovascular;  Laterality: N/A;    No Known Allergies  Current Outpatient Prescriptions  Medication Sig Dispense Refill  . aspirin EC 81 MG tablet Take 81 mg by mouth daily.    . ASSURE COMFORT LANCETS 30G MISC     . atenolol (TENORMIN) 50 MG tablet TAKE 1 TABLET BY MOUTH DAILY 30 tablet 11  . Blood Glucose Calibration (OT ULTRA/FASTTK CNTRL SOLN) SOLN     . Blood Glucose Monitoring Suppl (ONE TOUCH ULTRA 2) W/DEVICE KIT     .  Insulin Pen Needle (PEN NEEDLES 31GX5/16") 31G X 8 MM MISC Use with pen qd (Patient not taking: Reported on 06/06/2015) 100 each 5  . Lancet Devices (ADJUSTABLE LANCING DEVICE) MISC     . LEVEMIR 100 UNIT/ML injection INJECT 15 UNITS SUBCUTANEOUSLY AT BEDTIME . INCREASE BY 1 UNIT UNTIL BS IS BELOW 130 (Patient taking differently: INJECT 14-15  UNITS SUBCUTANEOUSLY AT BEDTIME . INCREASE BY 1 UNIT UNTIL BS IS BELOW 130) 10 mL 3  . lisinopril (PRINIVIL,ZESTRIL) 20 MG tablet TAKE 1 TABLET (20 MG TOTAL) BY MOUTH DAILY. 30 tablet 11  . metFORMIN (GLUCOPHAGE) 1000 MG tablet TAKE 1 TABLET TWICE A DAY WITH A MEAL 60 tablet 5  . ONE TOUCH ULTRA TEST test strip     . simvastatin (ZOCOR) 20  MG tablet TAKE 1 TABLET BY MOUTH AT BEDTIME (Patient not taking: Reported on 06/06/2015) 30 tablet 1   No current facility-administered medications for this visit.    Review of Systems : See HPI for pertinent positives and negatives.  Physical Examination  Filed Vitals:   12/12/15 1154 12/12/15 1158  BP: 153/82 137/77  Pulse: 70   Height: '5\' 4"'  (1.626 m)   Weight: 175 lb 6.4 oz (79.561 kg)   SpO2: 99%     Body mass index is 30.09 kg/(m^2).  General: WDWN male in NAD GAIT: normal Eyes: PERRLA Pulmonary: Non-labored, CTAB Cardiac: regular rhythm, no detected murmur.  VASCULAR EXAM Carotid Bruits Right Left   Negative Negative   Aorta is not palpable. Radial pulses are 2+ palpable and equal. Pedal pulses are palpable.    Gastrointestinal: soft, nontender, BS WNL, no r/g, no palpable masses.  Musculoskeletal: No muscle atrophy/wasting. M/S 5/5 throughout, Extremities without ischemic changes.  Neurologic: A&O X 3; Appropriate Affect,  Speech is loud, CN 2-12 intact except is hard of hearing, Pain and light touch intact in extremities, Motor exam as listed above.               Non-Invasive Vascular Imaging CAROTID DUPLEX 12/12/2015   Right ICA: CEA site with an increased proximal velocity which appears to be due to a change in vessel diameter. Left ICA: 40 - 59 % stenosis. No significant change compared to exam on 06/06/15.   Assessment: [name] is a 82 y.o. male who is status post right carotid endarterectomy in 2008 and redo 2013. He was seen July 2014 with duplex suggesting recurrent severe stenosis in his right internal carotid artery after endarterectomy in 2013. He underwent catheter-based arteriogram at that same setting and this showed 50% stenosis with significant change in caliber from the endarterectomy site to the  internal carotid artery. He has no history of stroke or TIA.  Today's carotid Duplex suggests right CEA site with an increased proximal ICA velocity which appears to be due to a change in vessel diameter. Left ICA with 40 - 59 % stenosis. No significant change compared to exam on 06/06/15.   Plan: Follow-up in 1 year with Carotid Duplex scan.   I discussed in depth with the patient the nature of atherosclerosis, and emphasized the importance of maximal medical management including strict control of blood pressure, blood glucose, and lipid levels, obtaining regular exercise, and continued cessation of smoking.  The patient is aware that without maximal medical management the underlying atherosclerotic disease process will progress, limiting the benefit of any interventions. The patient was given information about stroke prevention and what symptoms should prompt the patient to seek immediate medical care.  Thank you for allowing [place] to participate in this patient's care.  Clemon Chambers, RN, MSN, FNP-C [facility] [place] Office: [phone]  Clinic Physician: Oneida Alar  12/12/2015 11:54 AM

## 2015-12-12 NOTE — Addendum Note (Signed)
Addended by: Reola Calkins on: 12/12/2015 03:45 PM   Modules accepted: Orders

## 2015-12-12 NOTE — Patient Instructions (Signed)
Stroke Prevention Some medical conditions and behaviors are associated with an increased chance of having a stroke. You may prevent a stroke by making healthy choices and managing medical conditions. HOW CAN I REDUCE MY RISK OF HAVING A STROKE?   Stay physically active. Get at least 30 minutes of activity on most or all days.  Do not smoke. It may also be helpful to avoid exposure to secondhand smoke.  Limit alcohol use. Moderate alcohol use is considered to be:  No more than 2 drinks per day for men.  No more than 1 drink per day for nonpregnant women.  Eat healthy foods. This involves:  Eating 5 or more servings of fruits and vegetables a day.  Making dietary changes that address high blood pressure (hypertension), high cholesterol, diabetes, or obesity.  Manage your cholesterol levels.  Making food choices that are high in fiber and low in saturated fat, trans fat, and cholesterol may control cholesterol levels.  Take any prescribed medicines to control cholesterol as directed by your health care provider.  Manage your diabetes.  Controlling your carbohydrate and sugar intake is recommended to manage diabetes.  Take any prescribed medicines to control diabetes as directed by your health care provider.  Control your hypertension.  Making food choices that are low in salt (sodium), saturated fat, trans fat, and cholesterol is recommended to manage hypertension.  Ask your health care provider if you need treatment to lower your blood pressure. Take any prescribed medicines to control hypertension as directed by your health care provider.  If you are 18-39 years of age, have your blood pressure checked every 3-5 years. If you are 40 years of age or older, have your blood pressure checked every year.  Maintain a healthy weight.  Reducing calorie intake and making food choices that are low in sodium, saturated fat, trans fat, and cholesterol are recommended to manage  weight.  Stop drug abuse.  Avoid taking birth control pills.  Talk to your health care provider about the risks of taking birth control pills if you are over 35 years old, smoke, get migraines, or have ever had a blood clot.  Get evaluated for sleep disorders (sleep apnea).  Talk to your health care provider about getting a sleep evaluation if you snore a lot or have excessive sleepiness.  Take medicines only as directed by your health care provider.  For some people, aspirin or blood thinners (anticoagulants) are helpful in reducing the risk of forming abnormal blood clots that can lead to stroke. If you have the irregular heart rhythm of atrial fibrillation, you should be on a blood thinner unless there is a good reason you cannot take them.  Understand all your medicine instructions.  Make sure that other conditions (such as anemia or atherosclerosis) are addressed. SEEK IMMEDIATE MEDICAL CARE IF:   You have sudden weakness or numbness of the face, arm, or leg, especially on one side of the body.  Your face or eyelid droops to one side.  You have sudden confusion.  You have trouble speaking (aphasia) or understanding.  You have sudden trouble seeing in one or both eyes.  You have sudden trouble walking.  You have dizziness.  You have a loss of balance or coordination.  You have a sudden, severe headache with no known cause.  You have new chest pain or an irregular heartbeat. Any of these symptoms may represent a serious problem that is an emergency. Do not wait to see if the symptoms will   go away. Get medical help at once. Call your local emergency services (911 in [place] Do not drive yourself to the hospital.   This information is not intended to replace advice given to you by your health care provider. Make sure you discuss any questions you have with your health care provider.   Document Released: 11/05/2004 Document Revised: 10/19/2014 Document Reviewed:  03/31/2013 Elsevier Interactive Patient Education 2016 [employer]  

## 2015-12-17 ENCOUNTER — Other Ambulatory Visit: Payer: Self-pay | Admitting: Family Medicine

## 2015-12-17 NOTE — Telephone Encounter (Signed)
Refill appropriate and filled per protocol. 

## 2016-02-03 ENCOUNTER — Telehealth: Payer: Self-pay | Admitting: Family Medicine

## 2016-02-03 MED ORDER — INSULIN DETEMIR 100 UNIT/ML ~~LOC~~ SOLN: SUBCUTANEOUS | Status: DC

## 2016-02-03 NOTE — Telephone Encounter (Signed)
Medication called/sent to requested pharmacy  

## 2016-02-03 NOTE — Telephone Encounter (Signed)
[facility]  Patient wife calling to request levemir 100 units to be called in if possible  [phone]

## 2016-04-15 ENCOUNTER — Telehealth: Payer: Self-pay | Admitting: Family Medicine

## 2016-04-15 MED ORDER — METFORMIN HCL 1000 MG PO TABS: ORAL_TABLET | ORAL | Status: DC

## 2016-04-15 NOTE — Telephone Encounter (Signed)
Medication called/sent to requested pharmacy and pt has appt scheduled for 04/17/16

## 2016-04-15 NOTE — Telephone Encounter (Signed)
Pt is requesting a refill of Metformin be sent over to [facility] [address] [facility]

## 2016-04-17 ENCOUNTER — Encounter: Payer: Medicare Other | Admitting: Family Medicine

## 2016-04-17 NOTE — Progress Notes (Signed)
Subjective:    Patient ID: [name], male    DOB: 09-13-1931, 82 y.o.   MRN: [id number]  HPI 8/16 Patient is here today for follow-up of his insulin-dependent diabetes mellitus. He is not checking his sugars regularly but he states that his fasting blood sugars in the morning are typically around 140. He denies any sugars greater than 200. He denies any hypoglycemic episodes. Patiently saw his vascular surgeon in February. At that time carotid Dopplers revealed a 49-60% blockage in the right carotid artery as well as a 60-79% blockage in the left carotid artery. At the present time they are surveilling the patient once a year with carotid Dopplers. He is compliant with aspirin 81 mg by mouth daily. He is compliant with his blood pressure medication. His blood pressure today is borderline but acceptable given his age. He denies any chest pain shortness of breath or dyspnea on exertion. He denies any numbness or tingling in the feet. He denies any neuropathy. Pneumonia vaccine is up-to-date. Diabetic foot exam is up-to-date. However the patient is overdue for diabetic eye exam. He declined to see an ophthalmologist but after an extensive discussion I was able to convince the patient to go for annual diabetic eye exam.  At that  Time, my plan was: Patient's blood pressures acceptable given his advanced age. I would like to check a hemoglobin A1c today. Goal hemoglobin A1c given his age would be between 7 and 8 although I would like to give the patient is close to 7 as possible. I will check a fasting lipid panel. Goal LDL cholesterol is less than 70. I will schedule the patient for diabetic eye exam with an ophthalmologist. The remainder of his preventative care is up-to-date.  04/17/16 We asked the patient to come back today as he is long overdue for fasting lab work.  Hemoglobin A1c in August was 9.0. I recommended that he increase his insulin by 1 unit every day until fasting blood sugars were less  than 130. The patient was supposed to call us back with fasting blood sugars in 2 weeks. He never followed up  Past Medical History  Diagnosis Date  . Hyperlipidemia     takes Simvastatin daily  . Carotid artery occlusion   . Hypertension     takes Atenolol and Lisinopril daily  . Seasonal allergies   . Arthritis     knee  . Enlarged prostate     pt states no trouble with it  . Diabetes mellitus     metformin,januvia,and glipizide daily  . Impaired hearing, left     but doesn't wear hearing aids   Past Surgical History  Procedure Laterality Date  . Carotidendartectomy  2008    right side  . Appendectomy      as a child  . Endarterectomy  03/04/2012    Procedure: ENDARTERECTOMY CAROTID;  Surgeon: Rosetta Posner, MD;  Location: [facility] OR;  Service: Vascular;  Laterality: Right;  Redo Right Carotid Endarterectomy with hemasheild patch angioplasty  . Carotid endarterectomy Right 2008     Re-do Mar 04, 2012  . Carotid angiogram N/A 05/09/2013    Procedure: CAROTID ANGIOGRAM;  Surgeon: Serafina Mitchell, MD;  Location: [facility] CATH LAB;  Service: Cardiovascular;  Laterality: N/A;   Current Outpatient Prescriptions on File Prior to Visit  Medication Sig Dispense Refill  . aspirin EC 81 MG tablet Take 81 mg by mouth daily.    . ASSURE COMFORT LANCETS 30G MISC     .  atenolol (TENORMIN) 50 MG tablet TAKE 1 TABLET BY MOUTH DAILY 30 tablet 11  . Blood Glucose Calibration (OT ULTRA/FASTTK CNTRL SOLN) SOLN     . Blood Glucose Monitoring Suppl (ONE TOUCH ULTRA 2) W/DEVICE KIT     . insulin detemir (LEVEMIR) 100 UNIT/ML injection INJECT 15 UNITS SUBCUTANEOUSLY AT BEDTIME . INCREASE BY 1 UNIT UNTIL BS IS BELOW 130 10 mL 3  . Insulin Pen Needle (PEN NEEDLES 31GX5/16") 31G X 8 MM MISC Use with pen qd (Patient not taking: Reported on 06/06/2015) 100 each 5  . Lancet Devices (ADJUSTABLE LANCING DEVICE) MISC     . lisinopril (PRINIVIL,ZESTRIL) 20 MG tablet TAKE 1 TABLET (20 MG TOTAL) BY MOUTH DAILY. 30 tablet 11   . metFORMIN (GLUCOPHAGE) 1000 MG tablet TAKE 1 TABLET TWICE A DAY WITH A MEAL 60 tablet 5  . ONE TOUCH ULTRA TEST test strip     . simvastatin (ZOCOR) 20 MG tablet TAKE 1 TABLET BY MOUTH AT BEDTIME (Patient not taking: Reported on 06/06/2015) 30 tablet 1   No current facility-administered medications on file prior to visit.   No Known Allergies Social History   Social History  . Marital Status: Married    Spouse Name: N/A  . Number of Children: N/A  . Years of Education: N/A   Occupational History  . Not on file.   Social History Main Topics  . Smoking status: Former Smoker -- 25 years    Types: Cigarettes    Quit date: 10/13/1991  . Smokeless tobacco: Never Used  . Alcohol Use: No     Comment: occassional glass of wine  . Drug Use: No  . Sexual Activity: Yes   Other Topics Concern  . Not on file   Social History Narrative      Review of Systems  All other systems reviewed and are negative.      Objective:   Physical Exam  Constitutional: He appears well-developed and well-nourished.  Neck: Neck supple. No JVD present. No thyromegaly present.  Cardiovascular: Normal rate, regular rhythm and normal heart sounds.   No murmur heard. Pulmonary/Chest: Effort normal and breath sounds normal. No respiratory distress. He has no wheezes. He has no rales. He exhibits no tenderness.  Abdominal: Soft. Bowel sounds are normal. He exhibits no distension and no mass. There is no tenderness. There is no rebound and no guarding.  Musculoskeletal: He exhibits no edema.  Lymphadenopathy:    He has no cervical adenopathy.  Vitals reviewed.         Assessment & Plan:  Uncontrolled type 2 diabetes mellitus without complication, with long-term current use of insulin (HCC) - Plan: COMPLETE METABOLIC PANEL WITH GFR, CBC with Differential/Platelet, Lipid panel, Microalbumin, urine, Hemoglobin A1c  Essential hypertension  HLD (hyperlipidemia)  ASCVD (arteriosclerotic  cardiovascular disease)   This encounter was created in error - please disregard.

## 2016-04-27 ENCOUNTER — Ambulatory Visit (INDEPENDENT_AMBULATORY_CARE_PROVIDER_SITE_OTHER): Payer: PPO | Admitting: Family Medicine

## 2016-04-27 ENCOUNTER — Encounter: Payer: Self-pay | Admitting: Family Medicine

## 2016-04-27 VITALS — BP 130/74 | HR 68 | Temp 97.9°F | Resp 18 | Ht 64.0 in | Wt 170.0 lb

## 2016-04-27 DIAGNOSIS — E785 Hyperlipidemia, unspecified: Secondary | ICD-10-CM

## 2016-04-27 DIAGNOSIS — Z794 Long term (current) use of insulin: Secondary | ICD-10-CM

## 2016-04-27 DIAGNOSIS — I10 Essential (primary) hypertension: Secondary | ICD-10-CM

## 2016-04-27 DIAGNOSIS — E1100 Type 2 diabetes mellitus with hyperosmolarity without nonketotic hyperglycemic-hyperosmolar coma (NKHHC): Secondary | ICD-10-CM

## 2016-04-27 DIAGNOSIS — I2510 Atherosclerotic heart disease of native coronary artery without angina pectoris: Secondary | ICD-10-CM | POA: Diagnosis not present

## 2016-04-27 NOTE — Progress Notes (Signed)
Subjective:    Patient ID: [name], male    DOB: 1931/01/21, 82 y.o.   MRN: [id number]  HPI 8/16 Patient is here today for follow-up of his insulin-dependent diabetes mellitus. He is not checking his sugars regularly but he states that his fasting blood sugars in the morning are typically around 140. He denies any sugars greater than 200. He denies any hypoglycemic episodes. Patiently saw his vascular surgeon in February. At that time carotid Dopplers revealed a 49-60% blockage in the right carotid artery as well as a 60-79% blockage in the left carotid artery. At the present time they are surveilling the patient once a year with carotid Dopplers. He is compliant with aspirin 81 mg by mouth daily. He is compliant with his blood pressure medication. His blood pressure today is borderline but acceptable given his age. He denies any chest pain shortness of breath or dyspnea on exertion. He denies any numbness or tingling in the feet. He denies any neuropathy. Pneumonia vaccine is up-to-date. Diabetic foot exam is up-to-date. However the patient is overdue for diabetic eye exam. He declined to see an ophthalmologist but after an extensive discussion I was able to convince the patient to go for annual diabetic eye exam.  At that time, my plan was: Patient's blood pressures acceptable given his advanced age. I would like to check a hemoglobin A1c today. Goal hemoglobin A1c given his age would be between 7 and 8 although I would like to give the patient is close to 7 as possible. I will check a fasting lipid panel. Goal LDL cholesterol is less than 70. I will schedule the patient for diabetic eye exam with an ophthalmologist. The remainder of his preventative care is up-to-date.  04/27/16 HgA1c was 9.0.  I recommended increasing insulin by 1 unit a day until fbs were less than 130.  However, patient never followed up as recommended and is here today at our request.  He admits that he is not checking his  sugars as often as he should. He is only checking his sugars once a week. Typically is around 100. However I question if he is even checking it that often. He states that he has seen his sugars as high as 200. He has been off his metformin for approximately 1 week. We discussed the risk factors that he has a need to control his sugars. We discussed a low carbohydrate diet. The patient does not want To check his sugars frequently and therefore increasing insulin is going to be very difficult. He denies any chest pain or shortness of breath or dyspnea on exertion. He denies any neuropathy in his feet  Past Medical History  Diagnosis Date  . Hyperlipidemia     takes Simvastatin daily  . Carotid artery occlusion   . Hypertension     takes Atenolol and Lisinopril daily  . Seasonal allergies   . Arthritis     knee  . Enlarged prostate     pt states no trouble with it  . Diabetes mellitus     metformin,januvia,and glipizide daily  . Impaired hearing, left     but doesn't wear hearing aids   Past Surgical History  Procedure Laterality Date  . Carotidendartectomy  2008    right side  . Appendectomy      as a child  . Endarterectomy  03/04/2012    Procedure: ENDARTERECTOMY CAROTID;  Surgeon: Rosetta Posner, MD;  Location: [place];  Service: Vascular;  Laterality: Right;  Redo Right Carotid Endarterectomy with hemasheild patch angioplasty  . Carotid endarterectomy Right 2008     Re-do Mar 04, 2012  . Carotid angiogram N/A 05/09/2013    Procedure: CAROTID ANGIOGRAM;  Surgeon: Serafina Mitchell, MD;  Location: [facility] CATH LAB;  Service: Cardiovascular;  Laterality: N/A;   Current Outpatient Prescriptions on File Prior to Visit  Medication Sig Dispense Refill  . aspirin EC 81 MG tablet Take 81 mg by mouth daily.    . ASSURE COMFORT LANCETS 30G MISC     . atenolol (TENORMIN) 50 MG tablet TAKE 1 TABLET BY MOUTH DAILY 30 tablet 11  . Blood Glucose Calibration (OT ULTRA/FASTTK CNTRL SOLN) SOLN     . Blood  Glucose Monitoring Suppl (ONE TOUCH ULTRA 2) W/DEVICE KIT     . insulin detemir (LEVEMIR) 100 UNIT/ML injection INJECT 15 UNITS SUBCUTANEOUSLY AT BEDTIME . INCREASE BY 1 UNIT UNTIL BS IS BELOW 130 10 mL 3  . Insulin Pen Needle (PEN NEEDLES 31GX5/16") 31G X 8 MM MISC Use with pen qd (Patient not taking: Reported on 06/06/2015) 100 each 5  . Lancet Devices (ADJUSTABLE LANCING DEVICE) MISC     . lisinopril (PRINIVIL,ZESTRIL) 20 MG tablet TAKE 1 TABLET (20 MG TOTAL) BY MOUTH DAILY. 30 tablet 11  . metFORMIN (GLUCOPHAGE) 1000 MG tablet TAKE 1 TABLET TWICE A DAY WITH A MEAL 60 tablet 5  . ONE TOUCH ULTRA TEST test strip     . simvastatin (ZOCOR) 20 MG tablet TAKE 1 TABLET BY MOUTH AT BEDTIME (Patient not taking: Reported on 06/06/2015) 30 tablet 1   No current facility-administered medications on file prior to visit.   No Known Allergies Social History   Social History  . Marital Status: Married    Spouse Name: N/A  . Number of Children: N/A  . Years of Education: N/A   Occupational History  . Not on file.   Social History Main Topics  . Smoking status: Former Smoker -- 25 years    Types: Cigarettes    Quit date: 10/13/1991  . Smokeless tobacco: Never Used  . Alcohol Use: No     Comment: occassional glass of wine  . Drug Use: No  . Sexual Activity: Yes   Other Topics Concern  . Not on file   Social History Narrative      Review of Systems  All other systems reviewed and are negative.      Objective:   Physical Exam  Constitutional: He appears well-developed and well-nourished.  Neck: Neck supple. No JVD present. No thyromegaly present.  Cardiovascular: Normal rate, regular rhythm and normal heart sounds.   No murmur heard. Pulmonary/Chest: Effort normal and breath sounds normal. No respiratory distress. He has no wheezes. He has no rales. He exhibits no tenderness.  Abdominal: Soft. Bowel sounds are normal. He exhibits no distension and no mass. There is no tenderness.  There is no rebound and no guarding.  Musculoskeletal: He exhibits no edema.  Lymphadenopathy:    He has no cervical adenopathy.  Vitals reviewed.         Assessment & Plan:  Uncontrolled type 2 diabetes mellitus with hyperosmolarity without coma, with long-term current use of insulin ([place]) - Plan: CBC with Differential/Platelet, COMPLETE METABOLIC PANEL WITH GFR, Hemoglobin A1c, Lipid panel, Microalbumin, urine  HLD (hyperlipidemia) - Plan: CBC with Differential/Platelet, COMPLETE METABOLIC PANEL WITH GFR, Lipid panel  Essential hypertension - Plan: CBC with Differential/Platelet, COMPLETE METABOLIC PANEL WITH GFR, Lipid panel  ASCVD (arteriosclerotic cardiovascular  disease)  Given his advanced age, it would be next to impossible to increase his insulin safely without him checking his sugars. Therefore I will check a hemoglobin A1c and if greater than 8, I would recommend resuming Actos 30 mg a day. Although not ideal is a medication this will be discharged Kristen safest option to try to get his sugars under better control. His blood pressures under good control today. I will check a fasting lipid panel. His goal LDL cholesterol is less than 70 given his history of ASCVD.

## 2016-04-28 LAB — LIPID PANEL
CHOL/HDL RATIO: 4.6 ratio (ref <=5.0)
CHOLESTEROL: 119 mg/dL — AB (ref 125–200)
HDL: 26 mg/dL — AB (ref >=40)
LDL Cholesterol: 44 mg/dL (ref <130)
TRIGLYCERIDES: 243 mg/dL — AB (ref <150)
VLDL: 49 mg/dL — AB (ref <30)

## 2016-04-28 LAB — CBC WITH DIFFERENTIAL/PLATELET
BASOS ABS: 0 {cells}/uL (ref 0–200)
BASOS PCT: 0 %
EOS PCT: 2 %
Eosinophils Absolute: 142 cells/uL (ref 15–500)
HCT: 45.2 % (ref 38.5–50.0)
Hemoglobin: 14.8 g/dL (ref 13.0–17.0)
LYMPHS PCT: 35 %
Lymphs Abs: 2485 cells/uL (ref 850–3900)
MCH: 30.6 pg (ref 27.0–33.0)
MCHC: 32.7 g/dL (ref 32.0–36.0)
MCV: 93.4 fL (ref 80.0–100.0)
MONOS PCT: 9 %
MPV: 9.6 fL (ref 7.5–12.5)
Monocytes Absolute: 639 cells/uL (ref 200–950)
NEUTROS ABS: 3834 {cells}/uL (ref 1500–7800)
Neutrophils Relative %: 54 %
PLATELETS: 179 10*3/uL (ref 140–400)
RBC: 4.84 MIL/uL (ref 4.20–5.80)
RDW: 13.7 % (ref 11.0–15.0)
WBC: 7.1 10*3/uL (ref 3.8–10.8)

## 2016-04-28 LAB — COMPLETE METABOLIC PANEL WITH GFR
ALT: 10 U/L (ref 9–46)
AST: 10 U/L (ref 10–35)
Albumin: 3.8 g/dL (ref 3.6–5.1)
Alkaline Phosphatase: 76 U/L (ref 40–115)
BILIRUBIN TOTAL: 0.6 mg/dL (ref 0.2–1.2)
BUN: 24 mg/dL (ref 7–25)
CHLORIDE: 104 mmol/L (ref 98–110)
CO2: 26 mmol/L (ref 20–31)
Calcium: 9.3 mg/dL (ref 8.6–10.3)
Creat: 1.33 mg/dL — ABNORMAL HIGH (ref 0.70–1.11)
GFR, EST NON AFRICAN AMERICAN: 49 mL/min — AB (ref >=60)
GFR, Est African American: 57 mL/min — ABNORMAL LOW (ref >=60)
GLUCOSE: 169 mg/dL — AB (ref 70–99)
Potassium: 4.7 mmol/L (ref 3.5–5.3)
SODIUM: 140 mmol/L (ref 135–146)
TOTAL PROTEIN: 6.3 g/dL (ref 6.1–8.1)

## 2016-04-28 LAB — HEMOGLOBIN A1C
Hgb A1c MFr Bld: 9.2 % — ABNORMAL HIGH (ref <5.7)
MEAN PLASMA GLUCOSE: 217 mg/dL

## 2016-04-29 ENCOUNTER — Other Ambulatory Visit: Payer: Self-pay | Admitting: Family Medicine

## 2016-04-29 MED ORDER — PIOGLITAZONE HCL 30 MG PO TABS: 30.0000 mg | ORAL_TABLET | Freq: Every day | ORAL | Status: DC

## 2016-06-23 ENCOUNTER — Telehealth: Payer: Self-pay

## 2016-06-23 NOTE — Telephone Encounter (Signed)
Attempted to return call to pt. following voice message left on Triage Line re: "knot in previous surgical site."  Left message to return call to office to speak to nurse.

## 2016-06-24 NOTE — Telephone Encounter (Signed)
rec'd return call from pt's wife.  Reported approx. 1" round, raised knot on the right neck incision, that feels solid.  Reported the area has been present x 2 days.  Denied fever/ chills, drainage, or pain.  Discussed with Dr. Donnetta Hutching.  Recommended office exam without any vasc. Studies.  Appt. Given for 9/19 @ 10:45 AM with NP.  Wife agreed with plan.

## 2016-06-25 ENCOUNTER — Encounter: Payer: Self-pay | Admitting: Family

## 2016-06-25 NOTE — Telephone Encounter (Signed)
appt made per staff message/pt call 06/25/16 beg

## 2016-06-29 ENCOUNTER — Encounter: Payer: Self-pay | Admitting: Family

## 2016-06-30 ENCOUNTER — Encounter: Payer: Self-pay | Admitting: Family

## 2016-06-30 ENCOUNTER — Ambulatory Visit (INDEPENDENT_AMBULATORY_CARE_PROVIDER_SITE_OTHER): Payer: PPO | Admitting: Family

## 2016-06-30 VITALS — BP 157/95 | HR 85 | Temp 98.5°F | Resp 20 | Ht 64.0 in | Wt 173.0 lb

## 2016-06-30 DIAGNOSIS — Z9889 Other specified postprocedural states: Secondary | ICD-10-CM

## 2016-06-30 DIAGNOSIS — L723 Sebaceous cyst: Secondary | ICD-10-CM

## 2016-06-30 DIAGNOSIS — Z87891 Personal history of nicotine dependence: Secondary | ICD-10-CM

## 2016-06-30 DIAGNOSIS — I6523 Occlusion and stenosis of bilateral carotid arteries: Secondary | ICD-10-CM

## 2016-06-30 NOTE — Progress Notes (Signed)
Chief Complaint: Follow up Extracranial Carotid Artery Stenosis   History of Present Illness  [name] is a 82 y.o. male patient of Dr. Donnetta Hutching who is status post right carotid endarterectomy in 2008 and redo 2013. He was seen July 2014 with duplex suggesting recurrent severe stenosis in his right internal carotid artery after endarterectomy in 2013. He underwent catheter-based arteriogram at that same setting and this showed 50% stenosis with significant change in caliber from the endarterectomy site to the internal carotid artery.   He returns today after phone call from wife to [facility] triage nurse on 06/24/16.  Reported approx. 1" round, raised knot on the right neck incision, that feels solid.  Reported the area has been present x 2 days.  Denied fever/ chills, drainage, or pain.   I last evaluated pt on 12/12/15 . At that time carotid Duplex suggested right CEA site with an increased proximal ICA velocity which appears to be due to a change in vessel diameter. Left ICA with 40 - 59 % stenosis. No significant change compared to exam on 06/06/15.  Patient has no history of TIA or stroke symptoms. Specifically the patient denies a history of amaurosis fugax or monocular blindness, unilateral facial drooping, hemiplegia, or receptive or expressive aphasia.   He denies claudication symptoms in legs with walking, denies non healing wounds.  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   Pt denies New Medical or Surgical History.  Pt Diabetic: Yes, review of records: A1C on 05/28/15 was 9.0, states he was started on insulin about May of 2016 Pt smoker: former smoker, quit in 1993  Pt meds include: Statin : yes Beta blocker: yes ASA: Yes Other anticoagulants/antiplatelets: no    Past Medical History:  Diagnosis Date  . Arthritis    knee  . Carotid artery occlusion   . Diabetes mellitus    metformin,januvia,and  glipizide daily  . Enlarged prostate    pt states no trouble with it  . Hyperlipidemia    takes Simvastatin daily  . Hypertension    takes Atenolol and Lisinopril daily  . Impaired hearing, left    but doesn't wear hearing aids  . Seasonal allergies     Social History Social History  Substance Use Topics  . Smoking status: Former Smoker    Years: 25.00    Types: Cigarettes    Quit date: 10/13/1991  . Smokeless tobacco: Never Used  . Alcohol use No     Comment: occassional glass of wine    Family History Family History  Problem Relation Age of Onset  . Anesthesia problems Neg Hx   . Hypotension Neg Hx   . Malignant hyperthermia Neg Hx   . Pseudochol deficiency Neg Hx   . Cancer Mother     Leukemia  . Leukemia Mother   . Asthma Father     Surgical History Past Surgical History:  Procedure Laterality Date  . APPENDECTOMY     as a child  . CAROTID ANGIOGRAM N/A 05/09/2013   Procedure: CAROTID ANGIOGRAM;  Surgeon: Serafina Mitchell, MD;  Location: [facility] CATH LAB;  Service: Cardiovascular;  Laterality: N/A;  . CAROTID ENDARTERECTOMY Right 2008    Re-do Mar 04, 2012  . carotidendartectomy  2008   right side  . ENDARTERECTOMY  03/04/2012   Procedure: ENDARTERECTOMY CAROTID;  Surgeon: Rosetta Posner, MD;  Location: [facility];  Service: Vascular;  Laterality: Right;  Redo Right Carotid Endarterectomy with hemasheild patch angioplasty    No Known Allergies  Current Outpatient Prescriptions  Medication Sig Dispense Refill  . aspirin EC 81 MG tablet Take 81 mg by mouth daily.    . ASSURE COMFORT LANCETS 30G MISC     . atenolol (TENORMIN) 50 MG tablet TAKE 1 TABLET BY MOUTH DAILY 30 tablet 11  . Blood Glucose Calibration (OT ULTRA/FASTTK CNTRL SOLN) SOLN     . Blood Glucose Monitoring Suppl (ONE TOUCH ULTRA 2) W/DEVICE KIT     . Insulin Pen Needle (PEN NEEDLES 31GX5/16") 31G X 8 MM MISC Use with pen qd 100 each 5  . Lancet Devices (ADJUSTABLE LANCING DEVICE) MISC     . LEVEMIR 100  UNIT/ML injection INJECT 15 UNITS SUBCUTANEOUSLY AT BEDTIME . INCREASE BY 1 UNIT UNTIL BS IS BELOW 130 10 mL 3  . lisinopril (PRINIVIL,ZESTRIL) 20 MG tablet TAKE 1 TABLET (20 MG TOTAL) BY MOUTH DAILY. 30 tablet 11  . metFORMIN (GLUCOPHAGE) 1000 MG tablet TAKE 1 TABLET TWICE A DAY WITH A MEAL 60 tablet 5  . ONE TOUCH ULTRA TEST test strip     . pioglitazone (ACTOS) 30 MG tablet Take 1 tablet (30 mg total) by mouth daily. 30 tablet 3   No current facility-administered medications for this visit.     Review of Systems : See HPI for pertinent positives and negatives.  Physical Examination  Vitals:   06/30/16 1017 06/30/16 1021  BP: (!) 157/84 (!) 157/95  Pulse: 85   Resp: 20   Temp: 98.5 F (36.9 C)   TempSrc: Oral   SpO2: 94%   Weight: 173 lb (78.5 kg)   Height: _0  (1.626 m)    Body mass index is 29.7 kg/m.  General: WDWN male in NAD GAIT: normal Eyes: PERRLA Pulmonary: Non-labored, CTAB Cardiac: regular rhythm, no detected murmur.  VASCULAR EXAM Carotid Bruits Right Left   Negative Negative   Aorta is not palpable. Radial pulses are 2+ palpable and equal. Pedal pulses are palpable.    Gastrointestinal: soft, nontender, BS WNL, no r/g, no palpable masses.  Musculoskeletal: No muscle atrophy/wasting. M/S 5/5 throughout, Extremities without ischemic changes.  Neurologic: A&O X 3; Appropriate Affect,  Speech is loud, CN 2-12 intact except is hard of hearing, Pain and light touch intact in extremities, Motor exam as listed above.    Assessment: [name] is a 82 y.o. male who is status post right carotid endarterectomy in 2008 and redo 2013. He was seen July 2014 with duplex suggesting recurrent severe stenosis in his right internal carotid artery after endarterectomy in 2013. He underwent catheter-based arteriogram at that  same setting and this showed 50% stenosis with significant change in caliber from the endarterectomy site to the internal carotid artery. He has no history of stroke or TIA.   He has a sebaceous cyst at the well healed right CEA incision. Dr. Donnetta Hutching incised and drained this, packed with a 2x2 gauze, 4x4 folded gauze dressing applied.    Plan: Follow-up in 3 weeks with me on a day that Dr. Donnetta Hutching is in the office, for right side neck I&D site evaluation.  Pt advised to wash the site daily with antibacterial soap and warm water, and to apply a clean dressing daily.  I discussed in depth with the patient the nature of atherosclerosis, and emphasized the importance of maximal medical management including strict control of blood pressure, blood glucose, and lipid levels, obtaining regular exercise, and  continued cessation of smoking.  The patient is aware that without maximal medical management the underlying atherosclerotic disease process will progress, limiting the benefit of any interventions. The patient was given information about stroke prevention and what symptoms should prompt the patient to seek immediate medical care. Thank you for allowing [place] to participate in this patient's care.  Clemon Chambers, RN, MSN, FNP-C [facility] Office: [phone]  Clinic Physician: Early  06/30/16 10:40 AM

## 2016-06-30 NOTE — Patient Instructions (Signed)
Incision and Drainage Incision and drainage is a procedure in which a sac-like structure (cystic structure) is opened and drained. The area to be drained usually contains material such as pus, fluid, or blood.  LET YOUR CAREGIVER KNOW ABOUT:   Allergies to medicine.  Medicines taken, including vitamins, herbs, eyedrops, over-the-counter medicines, and creams.  Use of steroids (by mouth or creams).  Previous problems with anesthetics or numbing medicines.  History of bleeding problems or blood clots.  Previous surgery.  Other health problems, including diabetes and kidney problems.  Possibility of pregnancy, if this applies. RISKS AND COMPLICATIONS  Pain.  Bleeding.  Scarring.  Infection. BEFORE THE PROCEDURE  You may need to have an ultrasound or other imaging tests to see how large or deep your cystic structure is. Blood tests may also be used to determine if you have an infection or how severe the infection is. You may need to have a tetanus shot. PROCEDURE  The affected area is cleaned with a cleaning fluid. The cyst area will then be numbed with a medicine (local anesthetic). A small incision will be made in the cystic structure. A syringe or catheter may be used to drain the contents of the cystic structure, or the contents may be squeezed out. The area will then be flushed with a cleansing solution. After cleansing the area, it is often gently packed with a gauze or another wound dressing. Once it is packed, it will be covered with gauze and tape or some other type of wound dressing. AFTER THE PROCEDURE   Often, you will be allowed to go home right after the procedure.  You may be given antibiotic medicine to prevent or heal an infection.  If the area was packed with gauze or some other wound dressing, you will likely need to come back in 1 to 2 days to get it removed.  The area should heal in about 14 days.   This information is not intended to replace advice given  to you by your health care provider. Make sure you discuss any questions you have with your health care provider.   Document Released: 03/24/2001 Document Revised: 03/29/2012 Document Reviewed: 11/23/2011 Elsevier Interactive Patient Education 2016 Elsevier Inc.  

## 2016-07-21 ENCOUNTER — Encounter: Payer: Self-pay | Admitting: Family

## 2016-07-23 ENCOUNTER — Ambulatory Visit (INDEPENDENT_AMBULATORY_CARE_PROVIDER_SITE_OTHER): Payer: PPO | Admitting: Family

## 2016-07-23 ENCOUNTER — Encounter: Payer: Self-pay | Admitting: Family

## 2016-07-23 VITALS — BP 148/81 | HR 74 | Temp 97.9°F | Resp 16 | Ht 65.0 in | Wt 172.0 lb

## 2016-07-23 DIAGNOSIS — Z9889 Other specified postprocedural states: Secondary | ICD-10-CM | POA: Diagnosis not present

## 2016-07-23 DIAGNOSIS — Z87891 Personal history of nicotine dependence: Secondary | ICD-10-CM | POA: Diagnosis not present

## 2016-07-23 DIAGNOSIS — Z872 Personal history of diseases of the skin and subcutaneous tissue: Secondary | ICD-10-CM | POA: Diagnosis not present

## 2016-07-23 DIAGNOSIS — I6523 Occlusion and stenosis of bilateral carotid arteries: Secondary | ICD-10-CM | POA: Diagnosis not present

## 2016-07-23 NOTE — Progress Notes (Signed)
Chief Complaint: Follow up Extracranial Carotid Artery Stenosis   History of Present Illness  [name] is a 82 y.o. male patient of Dr. Donnetta Hutching who is status post right carotid endarterectomy in 2008 and redo 2013. He was seen July 2014 with duplex suggesting recurrent severe stenosis in his right internal carotid artery after endarterectomy in 2013. He underwent catheter-based arteriogram at that same setting and this showed 50% stenosis with significant change in caliber from the endarterectomy site to the internal carotid artery.   He returns today for recheck of the sebaceous cyst located on his right CEA well healed incision, that was drained by Dr. Donnetta Hutching on 06/30/16.  He reports little drainage from the site and states it closed about 3 days ago, and is healing well. He has been washing the site daily with antibacterial soap and warm water.  He has no history of TIA or stroke symptoms. Specifically the patient denies a history of amaurosis fugax or monocular blindness, unilateral facial drooping, hemiplegia, or receptive or expressive aphasia.   He denies claudication symptoms in legs with walking, denies non healing wounds.  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   Pt denies New Medical or Surgical History.  Pt Diabetic: Yes, review of records: A1C on 04/29/16 was 9.2 (uncontrolled), states he was started on insulin about May of 2016 Pt smoker: former smoker, quit in 1993  Pt meds include: Statin : yes Beta blocker: yes ASA: Yes Other anticoagulants/antiplatelets: no    Past Medical History:  Diagnosis Date  . Arthritis    knee  . Carotid artery occlusion   . Diabetes mellitus    metformin,januvia,and glipizide daily  . Enlarged prostate    pt states no trouble with it  . Hyperlipidemia    takes Simvastatin daily  . Hypertension    takes Atenolol and Lisinopril daily  . Impaired hearing,  left    but doesn't wear hearing aids  . Seasonal allergies     Social History Social History  Substance Use Topics  . Smoking status: Former Smoker    Years: 25.00    Types: Cigarettes    Quit date: 10/13/1991  . Smokeless tobacco: Never Used  . Alcohol use No     Comment: occassional glass of wine    Family History Family History  Problem Relation Age of Onset  . Anesthesia problems Neg Hx   . Hypotension Neg Hx   . Malignant hyperthermia Neg Hx   . Pseudochol deficiency Neg Hx   . Cancer Mother     Leukemia  . Leukemia Mother   . Asthma Father     Surgical History Past Surgical History:  Procedure Laterality Date  . APPENDECTOMY     as a child  . CAROTID ANGIOGRAM N/A 05/09/2013   Procedure: CAROTID ANGIOGRAM;  Surgeon: Serafina Mitchell, MD;  Location: [facility] CATH LAB;  Service: Cardiovascular;  Laterality: N/A;  . CAROTID ENDARTERECTOMY Right 2008    Re-do Mar 04, 2012  . carotidendartectomy  2008   right side  . ENDARTERECTOMY  03/04/2012   Procedure: ENDARTERECTOMY CAROTID;  Surgeon: Rosetta Posner, MD;  Location: [facility] OR;  Service: Vascular;  Laterality: Right;  Redo Right Carotid Endarterectomy with hemasheild patch angioplasty    No Known Allergies  Current Outpatient Prescriptions  Medication Sig Dispense Refill  . aspirin EC 81 MG tablet Take 81 mg by mouth daily.    [name] Kitchen  ASSURE COMFORT LANCETS 30G MISC     . atenolol (TENORMIN) 50 MG tablet TAKE 1 TABLET BY MOUTH DAILY 30 tablet 11  . Blood Glucose Calibration (OT ULTRA/FASTTK CNTRL SOLN) SOLN     . Blood Glucose Monitoring Suppl (ONE TOUCH ULTRA 2) W/DEVICE KIT     . Insulin Pen Needle (PEN NEEDLES 31GX5/16") 31G X 8 MM MISC Use with pen qd 100 each 5  . Lancet Devices (ADJUSTABLE LANCING DEVICE) MISC     . LEVEMIR 100 UNIT/ML injection INJECT 15 UNITS SUBCUTANEOUSLY AT BEDTIME . INCREASE BY 1 UNIT UNTIL BS IS BELOW 130 10 mL 3  . lisinopril (PRINIVIL,ZESTRIL) 20 MG tablet TAKE 1 TABLET (20 MG TOTAL) BY MOUTH  DAILY. 30 tablet 11  . metFORMIN (GLUCOPHAGE) 1000 MG tablet TAKE 1 TABLET TWICE A DAY WITH A MEAL 60 tablet 5  . ONE TOUCH ULTRA TEST test strip     . pioglitazone (ACTOS) 30 MG tablet Take 1 tablet (30 mg total) by mouth daily. 30 tablet 3   No current facility-administered medications for this visit.     Review of Systems : See HPI for pertinent positives and negatives.  Physical Examination  Vitals:   07/23/16 0945 07/23/16 0946 07/23/16 0947  BP: (!) 147/69 (!) 151/82 (!) 148/81  Pulse: 74 74 74  Resp: 16    Temp: 97.9 F (36.6 C)    SpO2: 97%    Weight: 172 lb (78 kg)    Height: _0  (1.651 m)     Body mass index is 28.62 kg/m.  General: WDWN male in NAD GAIT:normal Eyes: PERRLA Pulmonary: Respirations are non-labored, CTAB, good air movement in all fields Cardiac: regular rhythm, no detected murmur.  VASCULAR EXAM Carotid Bruits Right Left   Negative Negative   Aorta is not palpable. Radial pulses are 2+ palpable and equal. Pedal pulses are palpable.   Gastrointestinal: soft, nontender, BS WNL, no r/g, no palpable masses.  Musculoskeletal: No muscle atrophy/wasting. M/S 5/5 throughout, Extremities without ischemic changes.  Neurologic: A&O X 3; Appropriate Affect,  Speech is loud, CN 2-12 intact except is hard of hearing, Pain and light touch intact in extremities, Motor exam as listed above.    Assessment: [name] is a 82 y.o. male who is status post right carotid endarterectomy in 2008 and redo 2013. He was seen July 2014 with duplex suggesting recurrent severe stenosis in his right internal carotid artery after endarterectomy in 2013. He underwent catheter-based arteriogram at that same setting and this showed 50% stenosis with significant change in caliber from the endarterectomy site to the internal carotid  artery. He has no history of stroke or TIA.  His atherosclerotic risk factors include uncontrolled DM and former smoker.  He had a sebaceous cyst at the well healed right CEA incision. Dr. Donnetta Hutching incised and drained this on 06/30/16. The site has healed, sealed, some mild coalescing of sebum at the site, no erythema of swelling, mild hyperplasia at the site.  DATA On 12/12/15 carotid Duplex suggested right CEA site with an increased proximal ICA velocity which appears to be due to a change in vessel diameter. Left ICA with 40 - 59 % stenosis. No significant change compared to exam on 06/06/15.   Plan: Follow-up in March 2018  with Carotid Duplex scan.   I discussed in depth with the patient the nature of atherosclerosis, and emphasized the importance of maximal medical management including strict control of blood pressure, blood glucose, and lipid levels,  obtaining regular exercise, and continued cessation of smoking.  The patient is aware that without maximal medical management the underlying atherosclerotic disease process will progress, limiting the benefit of any interventions. The patient was given information about stroke prevention and what symptoms should prompt the patient to seek immediate medical care. Thank you for allowing [place] to participate in this patient's care.  Clemon Chambers, RN, MSN, FNP-C [facility] Office: [phone]  Clinic Physician: Oneida Alar  07/23/16 9:57 AM

## 2016-07-23 NOTE — Patient Instructions (Signed)
Stroke Prevention Some medical conditions and behaviors are associated with an increased chance of having a stroke. You may prevent a stroke by making healthy choices and managing medical conditions. HOW CAN I REDUCE MY RISK OF HAVING A STROKE?   Stay physically active. Get at least 30 minutes of activity on most or all days.  Do not smoke. It may also be helpful to avoid exposure to secondhand smoke.  Limit alcohol use. Moderate alcohol use is considered to be:  No more than 2 drinks per day for men.  No more than 1 drink per day for nonpregnant women.  Eat healthy foods. This involves:  Eating 5 or more servings of fruits and vegetables a day.  Making dietary changes that address high blood pressure (hypertension), high cholesterol, diabetes, or obesity.  Manage your cholesterol levels.  Making food choices that are high in fiber and low in saturated fat, trans fat, and cholesterol may control cholesterol levels.  Take any prescribed medicines to control cholesterol as directed by your health care provider.  Manage your diabetes.  Controlling your carbohydrate and sugar intake is recommended to manage diabetes.  Take any prescribed medicines to control diabetes as directed by your health care provider.  Control your hypertension.  Making food choices that are low in salt (sodium), saturated fat, trans fat, and cholesterol is recommended to manage hypertension.  Ask your health care provider if you need treatment to lower your blood pressure. Take any prescribed medicines to control hypertension as directed by your health care provider.  If you are 18-39 years of age, have your blood pressure checked every 3-5 years. If you are 40 years of age or older, have your blood pressure checked every year.  Maintain a healthy weight.  Reducing calorie intake and making food choices that are low in sodium, saturated fat, trans fat, and cholesterol are recommended to manage  weight.  Stop drug abuse.  Avoid taking birth control pills.  Talk to your health care provider about the risks of taking birth control pills if you are over 35 years old, smoke, get migraines, or have ever had a blood clot.  Get evaluated for sleep disorders (sleep apnea).  Talk to your health care provider about getting a sleep evaluation if you snore a lot or have excessive sleepiness.  Take medicines only as directed by your health care provider.  For some people, aspirin or blood thinners (anticoagulants) are helpful in reducing the risk of forming abnormal blood clots that can lead to stroke. If you have the irregular heart rhythm of atrial fibrillation, you should be on a blood thinner unless there is a good reason you cannot take them.  Understand all your medicine instructions.  Make sure that other conditions (such as anemia or atherosclerosis) are addressed. SEEK IMMEDIATE MEDICAL CARE IF:   You have sudden weakness or numbness of the face, arm, or leg, especially on one side of the body.  Your face or eyelid droops to one side.  You have sudden confusion.  You have trouble speaking (aphasia) or understanding.  You have sudden trouble seeing in one or both eyes.  You have sudden trouble walking.  You have dizziness.  You have a loss of balance or coordination.  You have a sudden, severe headache with no known cause.  You have new chest pain or an irregular heartbeat. Any of these symptoms may represent a serious problem that is an emergency. Do not wait to see if the symptoms will   go away. Get medical help at once. Call your local emergency services (911 in [place] Do not drive yourself to the hospital.   This information is not intended to replace advice given to you by your health care provider. Make sure you discuss any questions you have with your health care provider.   Document Released: 11/05/2004 Document Revised: 10/19/2014 Document Reviewed:  03/31/2013 Elsevier Interactive Patient Education 2016 [employer]  

## 2016-08-18 DIAGNOSIS — H02409 Unspecified ptosis of unspecified eyelid: Secondary | ICD-10-CM | POA: Diagnosis not present

## 2016-08-18 DIAGNOSIS — H40033 Anatomical narrow angle, bilateral: Secondary | ICD-10-CM | POA: Diagnosis not present

## 2016-08-18 DIAGNOSIS — H35363 Drusen (degenerative) of macula, bilateral: Secondary | ICD-10-CM | POA: Diagnosis not present

## 2016-08-18 LAB — HM DIABETES EYE EXAM

## 2016-08-20 ENCOUNTER — Ambulatory Visit (INDEPENDENT_AMBULATORY_CARE_PROVIDER_SITE_OTHER): Payer: PPO | Admitting: Family Medicine

## 2016-08-20 ENCOUNTER — Encounter: Payer: Self-pay | Admitting: Family Medicine

## 2016-08-20 VITALS — BP 142/70 | HR 82 | Temp 98.1°F | Resp 16 | Wt 177.0 lb

## 2016-08-20 DIAGNOSIS — L723 Sebaceous cyst: Secondary | ICD-10-CM | POA: Diagnosis not present

## 2016-08-20 MED ORDER — ONETOUCH ULTRA 2 W/DEVICE KIT
PACK | 0 refills | Status: DC
Start: 2016-08-20 — End: 2018-04-11

## 2016-08-20 NOTE — Progress Notes (Signed)
Subjective:    Patient ID: [name], male    DOB: [birth year], 82 y.o.   MRN: [id number]  HPI Patient has a history of a carotid endarterectomy on his right carotid artery. At the bottom portion of the surgical scar, the patient states that he developed a soft cutaneous mass approximately one month ago. Patient saw his surgeon who performed an I&D and was able to express a small amount of fluid according to the patient. Patient does not recollect what the surgeon told him the cutaneous mass was. However the cystlike mass was skin colored, soft, nonpainful, not erythematous, and was not warm. Therefore I believe he has developed a sebaceous cyst near his old surgical scar. He is here today for second opinion. The lesion is approximately 1/2 cm to 1 cm in diameter.  There is a small scar where there was a small I&D performed. There is no erythema or warmth or fluctuance today Past Medical History:  Diagnosis Date  . Arthritis    knee  . Carotid artery occlusion   . Diabetes mellitus    metformin,januvia,and glipizide daily  . Enlarged prostate    pt states no trouble with it  . Hyperlipidemia    takes Simvastatin daily  . Hypertension    takes Atenolol and Lisinopril daily  . Impaired hearing, left    but doesn't wear hearing aids  . Seasonal allergies    Past Surgical History:  Procedure Laterality Date  . APPENDECTOMY     as a child  . CAROTID ANGIOGRAM N/A 05/09/2013   Procedure: CAROTID ANGIOGRAM;  Surgeon: Serafina Mitchell, MD;  Location: [facility] CATH LAB;  Service: Cardiovascular;  Laterality: N/A;  . CAROTID ENDARTERECTOMY Right 2008    Re-do Mar 04, 2012  . carotidendartectomy  2008   right side  . ENDARTERECTOMY  03/04/2012   Procedure: ENDARTERECTOMY CAROTID;  Surgeon: Rosetta Posner, MD;  Location: [facility] OR;  Service: Vascular;  Laterality: Right;  Redo Right Carotid Endarterectomy with hemasheild patch angioplasty   Current Outpatient Prescriptions on File Prior to Visit    Medication Sig Dispense Refill  . aspirin EC 81 MG tablet Take 81 mg by mouth daily.    . ASSURE COMFORT LANCETS 30G MISC     . atenolol (TENORMIN) 50 MG tablet TAKE 1 TABLET BY MOUTH DAILY 30 tablet 11  . Blood Glucose Calibration (OT ULTRA/FASTTK CNTRL SOLN) SOLN     . Insulin Pen Needle (PEN NEEDLES 31GX5/16") 31G X 8 MM MISC Use with pen qd 100 each 5  . Lancet Devices (ADJUSTABLE LANCING DEVICE) MISC     . LEVEMIR 100 UNIT/ML injection INJECT 15 UNITS SUBCUTANEOUSLY AT BEDTIME . INCREASE BY 1 UNIT UNTIL BS IS BELOW 130 10 mL 3  . lisinopril (PRINIVIL,ZESTRIL) 20 MG tablet TAKE 1 TABLET (20 MG TOTAL) BY MOUTH DAILY. 30 tablet 11  . metFORMIN (GLUCOPHAGE) 1000 MG tablet TAKE 1 TABLET TWICE A DAY WITH A MEAL 60 tablet 5  . ONE TOUCH ULTRA TEST test strip     . pioglitazone (ACTOS) 30 MG tablet Take 1 tablet (30 mg total) by mouth daily. 30 tablet 3   No current facility-administered medications on file prior to visit.    No Known Allergies Social History   Social History  . Marital status: Married    Spouse name: N/A  . Number of children: N/A  . Years of education: N/A   Occupational History  . Not on file.   Social  History Main Topics  . Smoking status: Former Smoker    Years: 25.00    Types: Cigarettes    Quit date: 10/13/1991  . Smokeless tobacco: Never Used  . Alcohol use No     Comment: occassional glass of wine  . Drug use: No  . Sexual activity: Yes   Other Topics Concern  . Not on file   Social History Narrative  . No narrative on file      Review of Systems  All other systems reviewed and are negative.      Objective:   Physical Exam  Cardiovascular: Normal rate and regular rhythm.   Pulmonary/Chest: Effort normal and breath sounds normal.  Vitals reviewed.  See hpi       Assessment & Plan:  Sebaceous cyst  My best guess is that this is a small sebaceous cyst that has formed in the previous surgical scar. It does not appear to be an  abscess today. Based on the history I do not believe it's a lipoma. It certainly is very superficial and I do not believe it's a lymph node. I do not believe is cancerous. At the present time I recommended clinical monitoring. Should it start to grow, become red hot or painful, I will see the patient back immediately.

## 2016-08-21 ENCOUNTER — Encounter: Payer: Self-pay | Admitting: Family Medicine

## 2016-08-22 ENCOUNTER — Other Ambulatory Visit: Payer: Self-pay | Admitting: Family Medicine

## 2016-09-08 ENCOUNTER — Other Ambulatory Visit: Payer: Self-pay | Admitting: Family Medicine

## 2016-09-10 ENCOUNTER — Ambulatory Visit (INDEPENDENT_AMBULATORY_CARE_PROVIDER_SITE_OTHER): Payer: PPO | Admitting: *Deleted

## 2016-09-10 DIAGNOSIS — Z23 Encounter for immunization: Secondary | ICD-10-CM | POA: Diagnosis not present

## 2016-09-10 NOTE — Progress Notes (Signed)
Patient ID: [name], male   DOB: 01/18/1931, 82 y.o.   MRN: [id number]  Patient seen in office for Influenza Vaccination.   Tolerated IM administration well.   Immunization history updated.

## 2016-10-22 ENCOUNTER — Encounter: Payer: Self-pay | Admitting: Family Medicine

## 2016-10-22 ENCOUNTER — Ambulatory Visit (INDEPENDENT_AMBULATORY_CARE_PROVIDER_SITE_OTHER): Payer: PPO | Admitting: Family Medicine

## 2016-10-22 VITALS — BP 140/82 | HR 72 | Temp 97.8°F | Resp 18 | Ht 64.0 in | Wt 180.0 lb

## 2016-10-22 DIAGNOSIS — L989 Disorder of the skin and subcutaneous tissue, unspecified: Secondary | ICD-10-CM | POA: Diagnosis not present

## 2016-10-22 DIAGNOSIS — L723 Sebaceous cyst: Secondary | ICD-10-CM | POA: Diagnosis not present

## 2016-10-22 NOTE — Progress Notes (Signed)
Subjective:    Patient ID: [name], male    DOB: [birth year], 83 y.o.   MRN: [id number]  HPI  06/2016 Patient has a history of a carotid endarterectomy on his right carotid artery. At the bottom portion of the surgical scar, the patient states that he developed a soft cutaneous mass approximately one month ago. Patient saw his surgeon who performed an I&D and was able to express a small amount of fluid according to the patient. Patient does not recollect what the surgeon told him the cutaneous mass was. However the cystlike mass was skin colored, soft, nonpainful, not erythematous, and was not warm. Therefore I believe he has developed a sebaceous cyst near his old surgical scar. He is here today for second opinion. The lesion is approximately 1/2 cm to 1 cm in diameter.  There is a small scar where there was a small I&D performed. There is no erythema or warmth or fluctuance today.  AT that time, my plan was: My best guess is that this is a small sebaceous cyst that has formed in the previous surgical scar. It does not appear to be an abscess today. Based on the history I do not believe it's a lipoma. It certainly is very superficial and I do not believe it's a lymph node. I do not believe is cancerous. At the present time I recommended clinical monitoring. Should it start to grow, become red hot or painful, I will see the patient back immediately.  10/22/16 The area persist. At the superior portion of the scar there is a small opening draining opaque yellow-brown drainage. There is no palpable mass. I believe the area is likely a sebaceous cyst that requires excision of the involved area limited to the skin. The patient would like to see a surgeon to discuss his options in managing this Past Medical History:  Diagnosis Date  . Arthritis    knee  . Carotid artery occlusion   . Diabetes mellitus    metformin,januvia,and glipizide daily  . Enlarged prostate    pt states no trouble with it  .  Hyperlipidemia    takes Simvastatin daily  . Hypertension    takes Atenolol and Lisinopril daily  . Impaired hearing, left    but doesn't wear hearing aids  . Seasonal allergies    Past Surgical History:  Procedure Laterality Date  . APPENDECTOMY     as a child  . CAROTID ANGIOGRAM N/A 05/09/2013   Procedure: CAROTID ANGIOGRAM;  Surgeon: Serafina Mitchell, MD;  Location: [facility] CATH LAB;  Service: Cardiovascular;  Laterality: N/A;  . CAROTID ENDARTERECTOMY Right 2008    Re-do Mar 04, 2012  . carotidendartectomy  2008   right side  . ENDARTERECTOMY  03/04/2012   Procedure: ENDARTERECTOMY CAROTID;  Surgeon: Rosetta Posner, MD;  Location: [facility] OR;  Service: Vascular;  Laterality: Right;  Redo Right Carotid Endarterectomy with hemasheild patch angioplasty   Current Outpatient Prescriptions on File Prior to Visit  Medication Sig Dispense Refill  . aspirin EC 81 MG tablet Take 81 mg by mouth daily.    . ASSURE COMFORT LANCETS 30G MISC     . atenolol (TENORMIN) 50 MG tablet TAKE 1 TABLET BY MOUTH DAILY 30 tablet 11  . Blood Glucose Calibration (OT ULTRA/FASTTK CNTRL SOLN) SOLN     . Blood Glucose Monitoring Suppl (ONE TOUCH ULTRA 2) w/Device KIT Checks BS BID - TID E11.9 1 each 0  . Insulin Pen Needle (PEN NEEDLES 31GX5/16")  31G X 8 MM MISC Use with pen qd 100 each 5  . Lancet Devices (ADJUSTABLE LANCING DEVICE) MISC     . LEVEMIR 100 UNIT/ML injection INJECT 15 UNITS SUBCUTANEOUSLY AT BEDTIME . INCREASE BY 1 UNIT UNTIL BS IS BELOW 130 10 mL 3  . lisinopril (PRINIVIL,ZESTRIL) 20 MG tablet TAKE 1 TABLET (20 MG TOTAL) BY MOUTH DAILY. 30 tablet 11  . metFORMIN (GLUCOPHAGE) 1000 MG tablet TAKE 1 TABLET TWICE A DAY WITH A MEAL 60 tablet 5  . ONE TOUCH ULTRA TEST test strip     . pioglitazone (ACTOS) 30 MG tablet TAKE 1 TABLET BY MOUTH EVERY DAY 30 tablet 3   No current facility-administered medications on file prior to visit.    No Known Allergies Social History   Social History  . Marital status:  Married    Spouse name: N/A  . Number of children: N/A  . Years of education: N/A   Occupational History  . Not on file.   Social History Main Topics  . Smoking status: Former Smoker    Years: 25.00    Types: Cigarettes    Quit date: 10/13/1991  . Smokeless tobacco: Never Used  . Alcohol use No     Comment: occassional glass of wine  . Drug use: No  . Sexual activity: Yes   Other Topics Concern  . Not on file   Social History Narrative  . No narrative on file      Review of Systems  All other systems reviewed and are negative.      Objective:   Physical Exam  Cardiovascular: Normal rate and regular rhythm.   Pulmonary/Chest: Effort normal and breath sounds normal.  Vitals reviewed.  See hpi       Assessment & Plan:  Non-healing skin lesion - Plan: WOUND CULTURE  I did send a wound culture of the fluid the straining although I do not believe that this is an abscess. Rather given the chronicity, it has been going on for 6 months, I believe the patient would benefit from surgical excision of the involved skin as I believe this is likely a subcutaneous sebaceous cyst forming in the scar. I will consult general surgery

## 2016-10-25 LAB — WOUND CULTURE
GRAM STAIN: NONE SEEN
Gram Stain: NONE SEEN

## 2016-11-14 ENCOUNTER — Other Ambulatory Visit: Payer: Self-pay | Admitting: Family Medicine

## 2016-11-21 ENCOUNTER — Other Ambulatory Visit: Payer: Self-pay | Admitting: Family Medicine

## 2016-12-15 ENCOUNTER — Encounter (HOSPITAL_COMMUNITY): Payer: PPO

## 2016-12-15 ENCOUNTER — Ambulatory Visit: Payer: PPO | Admitting: Family

## 2016-12-20 ENCOUNTER — Other Ambulatory Visit: Payer: Self-pay | Admitting: Family Medicine

## 2017-01-01 ENCOUNTER — Encounter: Payer: Self-pay | Admitting: Family

## 2017-01-12 ENCOUNTER — Encounter: Payer: Self-pay | Admitting: Family

## 2017-01-12 ENCOUNTER — Ambulatory Visit (INDEPENDENT_AMBULATORY_CARE_PROVIDER_SITE_OTHER): Payer: PPO | Admitting: Family

## 2017-01-12 ENCOUNTER — Ambulatory Visit (HOSPITAL_COMMUNITY)
Admission: RE | Admit: 2017-01-12 | Discharge: 2017-01-12 | Disposition: A | Discharge status: Home / Self Care | Payer: PPO | Source: Ambulatory Visit | Attending: Family | Admitting: Family

## 2017-01-12 VITALS — BP 152/85 | HR 105 | Temp 97.8°F | Resp 20 | Ht 64.0 in | Wt 175.8 lb

## 2017-01-12 DIAGNOSIS — Z9889 Other specified postprocedural states: Secondary | ICD-10-CM

## 2017-01-12 DIAGNOSIS — Z87891 Personal history of nicotine dependence: Secondary | ICD-10-CM

## 2017-01-12 DIAGNOSIS — I6523 Occlusion and stenosis of bilateral carotid arteries: Secondary | ICD-10-CM | POA: Insufficient documentation

## 2017-01-12 DIAGNOSIS — L723 Sebaceous cyst: Secondary | ICD-10-CM

## 2017-01-12 LAB — VAS US CAROTID
LCCADSYS: -69 cm/s
LCCAPDIAS: 22 cm/s
LEFT ECA DIAS: -25 cm/s
LEFT VERTEBRAL DIAS: -15 cm/s
LICAPDIAS: 25 cm/s
LICAPSYS: 92 cm/s
Left CCA dist dias: -16 cm/s
Left CCA prox sys: 110 cm/s
Left ICA dist dias: -9 cm/s
Left ICA dist sys: -40 cm/s
RCCAPSYS: 53 cm/s
RIGHT CCA MID DIAS: 11 cm/s
RIGHT ECA DIAS: 18 cm/s
RIGHT VERTEBRAL DIAS: 24 cm/s
Right CCA prox dias: 9 cm/s
Right cca dist sys: -137 cm/s

## 2017-01-12 NOTE — Patient Instructions (Signed)
Stroke Prevention Some medical conditions and behaviors are associated with an increased chance of having a stroke. You may prevent a stroke by making healthy choices and managing medical conditions. How can I reduce my risk of having a stroke?  Stay physically active. Get at least 30 minutes of activity on most or all days.  Do not smoke. It may also be helpful to avoid exposure to secondhand smoke.  Limit alcohol use. Moderate alcohol use is considered to be:  No more than 2 drinks per day for men.  No more than 1 drink per day for nonpregnant women.  Eat healthy foods. This involves:  Eating 5 or more servings of fruits and vegetables a day.  Making dietary changes that address high blood pressure (hypertension), high cholesterol, diabetes, or obesity.  Manage your cholesterol levels.  Making food choices that are high in fiber and low in saturated fat, trans fat, and cholesterol may control cholesterol levels.  Take any prescribed medicines to control cholesterol as directed by your health care provider.  Manage your diabetes.  Controlling your carbohydrate and sugar intake is recommended to manage diabetes.  Take any prescribed medicines to control diabetes as directed by your health care provider.  Control your hypertension.  Making food choices that are low in salt (sodium), saturated fat, trans fat, and cholesterol is recommended to manage hypertension.  Ask your health care provider if you need treatment to lower your blood pressure. Take any prescribed medicines to control hypertension as directed by your health care provider.  If you are 18-39 years of age, have your blood pressure checked every 3-5 years. If you are 40 years of age or older, have your blood pressure checked every year.  Maintain a healthy weight.  Reducing calorie intake and making food choices that are low in sodium, saturated fat, trans fat, and cholesterol are recommended to manage  weight.  Stop drug abuse.  Avoid taking birth control pills.  Talk to your health care provider about the risks of taking birth control pills if you are over 35 years old, smoke, get migraines, or have ever had a blood clot.  Get evaluated for sleep disorders (sleep apnea).  Talk to your health care provider about getting a sleep evaluation if you snore a lot or have excessive sleepiness.  Take medicines only as directed by your health care provider.  For some people, aspirin or blood thinners (anticoagulants) are helpful in reducing the risk of forming abnormal blood clots that can lead to stroke. If you have the irregular heart rhythm of atrial fibrillation, you should be on a blood thinner unless there is a good reason you cannot take them.  Understand all your medicine instructions.  Make sure that other conditions (such as anemia or atherosclerosis) are addressed. Get help right away if:  You have sudden weakness or numbness of the face, arm, or leg, especially on one side of the body.  Your face or eyelid droops to one side.  You have sudden confusion.  You have trouble speaking (aphasia) or understanding.  You have sudden trouble seeing in one or both eyes.  You have sudden trouble walking.  You have dizziness.  You have a loss of balance or coordination.  You have a sudden, severe headache with no known cause.  You have new chest pain or an irregular heartbeat. Any of these symptoms may represent a serious problem that is an emergency. Do not wait to see if the symptoms will go away.   Get medical help at once. Call your local emergency services (911 in [place] Do not drive yourself to the hospital. This information is not intended to replace advice given to you by your health care provider. Make sure you discuss any questions you have with your health care provider. Document Released: 11/05/2004 Document Revised: 03/05/2016 Document Reviewed: 03/31/2013 Elsevier  Interactive Patient Education  2017 Elsevier Inc.      Preventing Cerebrovascular Disease Arteries are blood vessels that carry blood that contains oxygen from the heart to all parts of the body. Cerebrovascular disease affects arteries that supply the brain. Any condition that blocks or disrupts blood flow to the brain can cause cerebrovascular disease. Brain cells that lose blood supply start to die within minutes (stroke). Stroke is the main danger of cerebrovascular disease. Atherosclerosis and high blood pressure are common causes of cerebrovascular disease. Atherosclerosis is narrowing and hardening of an artery that results when fat, cholesterol, calcium, or other substances (plaque) build up inside an artery. Plaque reduces blood flow through the artery. High blood pressure increases the risk of bleeding inside the brain. Making diet and lifestyle changes to prevent atherosclerosis and high blood pressure lowers your risk of cerebrovascular disease. What nutrition changes can be made?  Eat more fruits, vegetables, and whole grains.  Reduce how much saturated fat you eat. To do this, eat less red meat and fewer full-fat dairy products.  Eat healthy proteins instead of red meat. Healthy proteins include:  Fish. Eat fish that contains heart-healthy omega-3 fatty acids, twice a week. Examples include salmon, albacore tuna, mackerel, and herring.  Chicken.  Nuts.  Low-fat or nonfat yogurt.  Avoid processed meats, like bacon and lunchmeat.  Avoid foods that contain:  A lot of sugar, such as sweets and drinks with added sugar.  A lot of salt (sodium). Avoid adding extra salt to your food, as told by your health care provider.  Trans fats, such as margarine and baked goods. Trans fats may be listed as "partially hydrogenated oils" on food labels.  Check food labels to see how much sodium, sugar, and trans fats are in foods.  Use vegetable oils that contain low amounts of  saturated fat, such as olive oil or canola oil. What lifestyle changes can be made?  Drink alcohol in moderation. This means no more than 1 drink a day for nonpregnant women and 2 drinks a day for men. One drink equals 12 oz of beer, 5 oz of wine, or 1 oz of hard liquor.  If you are overweight, ask your health care provider to recommend a weight-loss plan for you. Losing 5-10 lb (2.2-4.5 kg) can reduce your risk of diabetes, atherosclerosis, and high blood pressure.  Exercise for 30?60 minutes on most days, or as much as told by your health care provider.  Do moderate-intensity exercise, such as brisk walking, bicycling, and water aerobics. Ask your health care provider which activities are safe for you.  Do not use any products that contain nicotine or tobacco, such as cigarettes and e-cigarettes. If you need help quitting, ask your health care provider. Why are these changes important? Making these changes lowers your risk of many diseases that can cause cerebrovascular disease and stroke. Stroke is a leading cause of death and disability. Making these changes also improves your overall health and quality of life. What can I do to lower my risk? The following factors make you more likely to develop cerebrovascular disease:  Being overweight.  Smoking.  Being physically   inactive.  Eating a high-fat diet.  Having certain health conditions, such as:  Diabetes.  High blood pressure.  Heart disease.  Atherosclerosis.  High cholesterol.  Sickle cell disease. Talk with your health care provider about your risk for cerebrovascular disease. Work with your health care provider to control diseases that you have that may contribute to cerebrovascular disease. Your health care provider may prescribe medicines to help prevent major causes of cerebrovascular disease. Where to find more information: Learn more about preventing cerebrovascular disease from:  National Heart, Lung, and  Blood Institute: www.nhlbi.nih.gov/health/health-topics/topics/stroke  Centers for Disease Control and Prevention: cdc.gov/stroke/about.htm Summary  Cerebrovascular disease can lead to a stroke.  Atherosclerosis and high blood pressure are major causes of cerebrovascular disease.  Making diet and lifestyle changes can reduce your risk of cerebrovascular disease.  Work with your health care provider to get your risk factors under control to reduce your risk of cerebrovascular disease. This information is not intended to replace advice given to you by your health care provider. Make sure you discuss any questions you have with your health care provider. Document Released: 10/13/2015 Document Revised: 04/17/2016 Document Reviewed: 10/13/2015 Elsevier Interactive Patient Education  2017 Elsevier Inc.  

## 2017-01-12 NOTE — Progress Notes (Signed)
Chief Complaint: Follow up Extracranial Carotid Artery Stenosis   History of Present Illness  [name] is a 83 y.o. male patient of Dr. Donnetta Hutching who is status post right carotid endarterectomy in 2008 and redo 2013. He was seen July 2014 with duplex suggesting recurrent severe stenosis in his right internal carotid artery after endarterectomy in 2013. He underwent catheter-based arteriogram at that same setting and this showed 50% stenosis with significant change in caliber from the endarterectomy site to the internal carotid artery.   The sebaceous cyst on his right CEA well healed incision has resolved.   He has no history of TIA or stroke symptoms. Specifically the patient denies a history of amaurosis fugax or monocular blindness, unilateral facial drooping, hemiplegia, or receptive or expressive aphasia.   He denies claudication symptoms in legs with walking, denies non healing wounds.  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   Pt denies New Medical or Surgical History.  Pt Diabetic: Yes, review of records: A1C on 04/29/16 was 9.2 (uncontrolled), states he was started on insulin about May of 2016 Pt smoker: former smoker, quit in 1993  Pt meds include: Statin : yes Beta blocker: yes ASA: Yes Other anticoagulants/antiplatelets: no    Past Medical History:  Diagnosis Date  . Arthritis    knee  . Carotid artery occlusion   . Diabetes mellitus    metformin,januvia,and glipizide daily  . Enlarged prostate    pt states no trouble with it  . Hyperlipidemia    takes Simvastatin daily  . Hypertension    takes Atenolol and Lisinopril daily  . Impaired hearing, left    but doesn't wear hearing aids  . Seasonal allergies     Social History Social History  Substance Use Topics  . Smoking status: Former Smoker    Years: 25.00    Types: Cigarettes    Quit date: 10/13/1991  . Smokeless tobacco:  Never Used  . Alcohol use No     Comment: occassional glass of wine    Family History Family History  Problem Relation Age of Onset  . Cancer Mother     Leukemia  . Leukemia Mother   . Asthma Father   . Anesthesia problems Neg Hx   . Hypotension Neg Hx   . Malignant hyperthermia Neg Hx   . Pseudochol deficiency Neg Hx     Surgical History Past Surgical History:  Procedure Laterality Date  . APPENDECTOMY     as a child  . CAROTID ANGIOGRAM N/A 05/09/2013   Procedure: CAROTID ANGIOGRAM;  Surgeon: Serafina Mitchell, MD;  Location: [facility] CATH LAB;  Service: Cardiovascular;  Laterality: N/A;  . CAROTID ENDARTERECTOMY Right 2008    Re-do Mar 04, 2012  . carotidendartectomy  2008   right side  . ENDARTERECTOMY  03/04/2012   Procedure: ENDARTERECTOMY CAROTID;  Surgeon: Rosetta Posner, MD;  Location: [facility] OR;  Service: Vascular;  Laterality: Right;  Redo Right Carotid Endarterectomy with hemasheild patch angioplasty    No Known Allergies  Current Outpatient Prescriptions  Medication Sig Dispense Refill  . aspirin EC 81 MG tablet Take 81 mg by mouth daily.    . ASSURE COMFORT LANCETS 30G MISC     . atenolol (TENORMIN) 50 MG tablet TAKE 1 TABLET BY MOUTH DAILY 30 tablet 11  . Blood Glucose Calibration (OT ULTRA/FASTTK CNTRL SOLN) SOLN     . Blood Glucose Monitoring Suppl (  ONE TOUCH ULTRA 2) w/Device KIT Checks BS BID - TID E11.9 1 each 0  . Insulin Pen Needle (PEN NEEDLES 31GX5/16") 31G X 8 MM MISC Use with pen qd 100 each 5  . Lancet Devices (ADJUSTABLE LANCING DEVICE) MISC     . LEVEMIR 100 UNIT/ML injection INJECT 15 UNITS SUBCUTANEOUSLY AT BEDTIME . INCREASE BY 1 UNIT UNTIL BS IS BELOW 130 10 mL 3  . lisinopril (PRINIVIL,ZESTRIL) 20 MG tablet TAKE 1 TABLET (20 MG TOTAL) BY MOUTH DAILY. 30 tablet 11  . metFORMIN (GLUCOPHAGE) 1000 MG tablet TAKE 1 TABLET TWICE A DAY WITH A MEAL 60 tablet 5  . ONE TOUCH ULTRA TEST test strip     . pioglitazone (ACTOS) 30 MG tablet TAKE 1 TABLET BY MOUTH  EVERY DAY 30 tablet 3   No current facility-administered medications for this visit.     Review of Systems : See HPI for pertinent positives and negatives.  Physical Examination  Vitals:   01/12/17 1550 01/12/17 1551  BP: (!) 150/93 (!) 152/85  Pulse: (!) 105   Resp: 20   Temp: 97.8 F (36.6 C)   TempSrc: Oral   SpO2: 98%   Weight: 175 lb 12.8 oz (79.7 kg)   Height: _0  (1.626 m)    Body mass index is 30.18 kg/m.  General: WDWN obese male in NAD GAIT:normal Eyes: PERRLA Pulmonary: Respirations are non-labored, CTAB, good air movement in all fields Cardiac: regular rhythm, mild tachycardia, no detected murmur.  VASCULAR EXAM Carotid Bruits Right Left   Negative Negative   Aorta is not palpable. Radial pulses are 2+ palpable and equal. Pedal pulses are palpable.   Gastrointestinal: soft, nontender, BS WNL, no r/g, no palpable masses.  Musculoskeletal: No muscle atrophy/wasting. M/S 5/5 throughout, Extremities without ischemic changes.  Neurologic: A&O X 3; Appropriate Affect,  Speech is loud, CN 2-12 intact except is hard of hearing, Pain and light touch intact in extremities, Motor exam as listed above.     Assessment: [name] is a 83 y.o. male who is status post right carotid endarterectomy in 2008 and redo 2013. He was seen July 2014 with duplex suggesting recurrent severe stenosis in his right internal carotid artery after endarterectomy in 2013. He underwent catheter-based arteriogram at that same setting and this showed 50% stenosis with significant change in caliber from the endarterectomy site to the internal carotid artery.  He has no history of stroke or TIA.  His atherosclerotic risk factors include uncontrolled DM and former smoker.  DATA (01/12/17):  Carotid Duplex : Right CEA site with an increased  proximal ICA velocity (40-59%) which appears to be due to a change in vessel diameter with visualized plaque. Left ICA with 60 - 79 % stenosis which has increased compared to the previous exam on 12-12-15. Bilateral vertebral artery flow is antegrade.  Bilateral subclavian artery waveforms are normal.      Plan: I advised Mr. Gartman to work closely with Dr. Dennard Schaumann to get his DM under much better control to reduce his risk of stroke, MI, neuropathic pain, eye complications, kidney failure, etc as complications of uncontrolled DM.    Follow-up in 6 months with Carotid Duplex scan.  I discussed in depth with the patient the nature of atherosclerosis, and emphasized the importance of maximal medical management including strict control of blood pressure, blood glucose, and lipid levels, obtaining regular exercise, and continued cessation of smoking.  The patient is aware that without maximal medical management the underlying  atherosclerotic disease process will progress, limiting the benefit of any interventions. The patient was given information about stroke prevention and what symptoms should prompt the patient to seek immediate medical care. Thank you for allowing [place] to participate in this patient's care.  Clemon Chambers, RN, MSN, FNP-C [facility] Office: [facility] Physician: Early  01/12/17 3:52 PM

## 2017-01-13 NOTE — Addendum Note (Signed)
Addended by: Lianne Cure A on: 01/13/2017 09:42 AM   Modules accepted: Orders

## 2017-02-10 ENCOUNTER — Telehealth: Payer: Self-pay | Admitting: Family Medicine

## 2017-02-10 MED ORDER — GLUCOSE BLOOD VI STRP: ORAL_STRIP | 5 refills | Status: DC

## 2017-02-10 NOTE — Telephone Encounter (Signed)
Pt needs refill on BD insulin syringes with the BD ultra fine needle.

## 2017-02-10 NOTE — Telephone Encounter (Signed)
Called and spoke to wife and he needs test strips and she is not sure about the other. Appears on his chart he uses pens so he would not need syringes but may need needles. She will check and call me back.

## 2017-02-17 ENCOUNTER — Other Ambulatory Visit: Payer: Self-pay | Admitting: Family Medicine

## 2017-02-17 MED ORDER — UNABLE TO FIND
11 refills | Status: DC
Start: 2017-02-17 — End: 2018-04-11

## 2017-04-17 ENCOUNTER — Other Ambulatory Visit: Payer: Self-pay | Admitting: Family Medicine

## 2017-04-21 ENCOUNTER — Other Ambulatory Visit: Payer: Self-pay | Admitting: Family Medicine

## 2017-04-21 MED ORDER — METFORMIN HCL 1000 MG PO TABS: ORAL_TABLET | ORAL | 3 refills | Status: DC

## 2017-05-20 ENCOUNTER — Ambulatory Visit (INDEPENDENT_AMBULATORY_CARE_PROVIDER_SITE_OTHER): Payer: PPO | Admitting: Family Medicine

## 2017-05-20 ENCOUNTER — Encounter: Payer: Self-pay | Admitting: Family Medicine

## 2017-05-20 VITALS — BP 156/90 | HR 94 | Temp 98.3°F | Resp 16 | Ht 64.0 in | Wt 176.0 lb

## 2017-05-20 DIAGNOSIS — E1165 Type 2 diabetes mellitus with hyperglycemia: Secondary | ICD-10-CM

## 2017-05-20 DIAGNOSIS — E1169 Type 2 diabetes mellitus with other specified complication: Secondary | ICD-10-CM

## 2017-05-20 DIAGNOSIS — Z794 Long term (current) use of insulin: Secondary | ICD-10-CM | POA: Diagnosis not present

## 2017-05-20 DIAGNOSIS — IMO0002 Reserved for concepts with insufficient information to code with codable children: Secondary | ICD-10-CM

## 2017-05-20 DIAGNOSIS — I2510 Atherosclerotic heart disease of native coronary artery without angina pectoris: Secondary | ICD-10-CM | POA: Diagnosis not present

## 2017-05-20 DIAGNOSIS — I10 Essential (primary) hypertension: Secondary | ICD-10-CM

## 2017-05-20 MED ORDER — ATENOLOL 50 MG PO TABS: 50.0000 mg | ORAL_TABLET | Freq: Every day | ORAL | 3 refills | Status: DC

## 2017-05-20 NOTE — Progress Notes (Signed)
Subjective:    Patient ID: [name], male    DOB: [birth year], 83 y.o.   MRN: [id number]  HPI  Patient is here today for follow-up of his blood pressure, hyperlipidemia, and insulin-dependent diabetes mellitus. He states that he uses Levemir 15 units a day. Fasting blood sugars are typically less than 130. He denies any hypoglycemic episodes. He states that he checks his blood sugars every 2-3 days. He is not checking any 2 hour postprandial sugars. He denies any blood sugars greater than 200 however he is not checking it frequently. His blood pressure today is elevated. However after reviewing his medication list, however he is not actually taking his atenolol. He denies any chest pain shortness of breath or dyspnea on exertion. He denies any myalgias or right upper quadrant pain. Past Medical History:  Diagnosis Date  . Arthritis    knee  . Carotid artery occlusion   . Diabetes mellitus    metformin,januvia,and glipizide daily  . Enlarged prostate    pt states no trouble with it  . Hyperlipidemia    takes Simvastatin daily  . Hypertension    takes Atenolol and Lisinopril daily  . Impaired hearing, left    but doesn't wear hearing aids  . Seasonal allergies    Past Surgical History:  Procedure Laterality Date  . APPENDECTOMY     as a child  . CAROTID ANGIOGRAM N/A 05/09/2013   Procedure: CAROTID ANGIOGRAM;  Surgeon: Serafina Mitchell, MD;  Location: [facility] CATH LAB;  Service: Cardiovascular;  Laterality: N/A;  . CAROTID ENDARTERECTOMY Right 2008    Re-do Mar 04, 2012  . carotidendartectomy  2008   right side  . ENDARTERECTOMY  03/04/2012   Procedure: ENDARTERECTOMY CAROTID;  Surgeon: Rosetta Posner, MD;  Location: [facility] OR;  Service: Vascular;  Laterality: Right;  Redo Right Carotid Endarterectomy with hemasheild patch angioplasty   Current Outpatient Prescriptions on File Prior to Visit  Medication Sig Dispense Refill  . aspirin EC 81 MG tablet Take 81 mg by mouth daily.    .  ASSURE COMFORT LANCETS 30G MISC     . Blood Glucose Calibration (OT ULTRA/FASTTK CNTRL SOLN) SOLN     . Blood Glucose Monitoring Suppl (ONE TOUCH ULTRA 2) w/Device KIT Checks BS BID - TID E11.9 1 each 0  . glucose blood (ONE TOUCH ULTRA TEST) test strip Checks BS bid - tid E11.9 100 each 5  . Insulin Pen Needle (PEN NEEDLES 31GX5/16") 31G X 8 MM MISC Use with pen qd 100 each 5  . Lancet Devices (ADJUSTABLE LANCING DEVICE) MISC     . LEVEMIR 100 UNIT/ML injection INJECT 15 UNITS SUBCUTANEOUSLY AT BEDTIME . INCREASE BY 1 UNIT UNTIL BS IS BELOW 130 10 mL 3  . lisinopril (PRINIVIL,ZESTRIL) 20 MG tablet TAKE 1 TABLET (20 MG TOTAL) BY MOUTH DAILY. 30 tablet 11  . metFORMIN (GLUCOPHAGE) 1000 MG tablet TAKE 1 TABLET TWICE A DAY WITH A MEAL 180 tablet 3  . pioglitazone (ACTOS) 30 MG tablet TAKE 1 TABLET BY MOUTH EVERY DAY 90 tablet 3  . UNABLE TO FIND Pt uses BD insulin syringes - 3/10 cc - 15m - 31G 100 Syringe 11  . atenolol (TENORMIN) 50 MG tablet TAKE 1 TABLET BY MOUTH DAILY (Patient not taking: Reported on 05/20/2017) 30 tablet 11   No current facility-administered medications on file prior to visit.    No Known Allergies Social History   Social History  . Marital status: Married  Spouse name: N/A  . Number of children: N/A  . Years of education: N/A   Occupational History  . Not on file.   Social History Main Topics  . Smoking status: Former Smoker    Years: 25.00    Types: Cigarettes    Quit date: 10/13/1991  . Smokeless tobacco: Never Used  . Alcohol use No     Comment: occassional glass of wine  . Drug use: No  . Sexual activity: Yes   Other Topics Concern  . Not on file   Social History Narrative  . No narrative on file      Review of Systems  All other systems reviewed and are negative.      Objective:   Physical Exam  Constitutional: He appears well-developed and well-nourished. No distress.  Neck: Neck supple. No JVD present. No thyromegaly present.    Cardiovascular: Normal rate, regular rhythm, normal heart sounds and intact distal pulses.  Exam reveals no gallop and no friction rub.   No murmur heard. Pulmonary/Chest: Effort normal and breath sounds normal. No respiratory distress. He has no wheezes. He has no rales.  Abdominal: Soft. Bowel sounds are normal. He exhibits no distension and no mass. There is no tenderness. There is no rebound and no guarding.  Musculoskeletal: He exhibits no edema.  Lymphadenopathy:    He has no cervical adenopathy.  Skin: He is not diaphoretic.  Vitals reviewed.         Assessment & Plan:  ASCVD (arteriosclerotic cardiovascular disease)  Essential hypertension  Uncontrolled type 2 diabetes mellitus with other specified complication, with long-term current use of insulin (HCC) - Plan: COMPLETE METABOLIC PANEL WITH GFR, Lipid panel, Hemoglobin A1c, Microalbumin, urine, CBC with Differential/Platelet Patient is very nice but noncompliant. I do not believe these checking his sugars as often as he says. I doubt that there is controlled as he says. I would like to check a hemoglobin A1c. He needs to return fasting for this summer but I can also get fasting lipid panel. His goal LDL cholesterol is less than 70. His blood pressures too high today, I will have the patient resume atenolol 50 mg by mouth daily and recheck blood pressure in one month. I will also check a urine microalbumin and a CBC as well.  

## 2017-05-21 LAB — CBC WITH DIFFERENTIAL/PLATELET
BASOS ABS: 0 {cells}/uL (ref 0–200)
Basophils Relative: 0 %
EOS ABS: 150 {cells}/uL (ref 15–500)
EOS PCT: 2 %
HEMATOCRIT: 47.1 % (ref 38.5–50.0)
HEMOGLOBIN: 15.1 g/dL (ref 13.0–17.0)
LYMPHS ABS: 2400 {cells}/uL (ref 850–3900)
Lymphocytes Relative: 32 %
MCH: 30.8 pg (ref 27.0–33.0)
MCHC: 32.1 g/dL (ref 32.0–36.0)
MCV: 95.9 fL (ref 80.0–100.0)
MPV: 9.3 fL (ref 7.5–12.5)
Monocytes Absolute: 825 cells/uL (ref 200–950)
Monocytes Relative: 11 %
NEUTROS ABS: 4125 {cells}/uL (ref 1500–7800)
Neutrophils Relative %: 55 %
Platelets: 217 10*3/uL (ref 140–400)
RBC: 4.91 MIL/uL (ref 4.20–5.80)
RDW: 13.8 % (ref 11.0–15.0)
WBC: 7.5 10*3/uL (ref 3.8–10.8)

## 2017-05-21 LAB — COMPLETE METABOLIC PANEL WITH GFR
ALT: 13 U/L (ref 9–46)
AST: 14 U/L (ref 10–35)
Albumin: 4.4 g/dL (ref 3.6–5.1)
Alkaline Phosphatase: 70 U/L (ref 40–115)
BUN: 23 mg/dL (ref 7–25)
CALCIUM: 9.5 mg/dL (ref 8.6–10.3)
CHLORIDE: 101 mmol/L (ref 98–110)
CO2: 21 mmol/L (ref 20–32)
Creat: 1.38 mg/dL — ABNORMAL HIGH (ref 0.70–1.11)
GFR, Est African American: 54 mL/min — ABNORMAL LOW (ref >=60)
GFR, Est Non African American: 47 mL/min — ABNORMAL LOW (ref >=60)
GLUCOSE: 142 mg/dL — AB (ref 70–99)
POTASSIUM: 4.6 mmol/L (ref 3.5–5.3)
SODIUM: 137 mmol/L (ref 135–146)
Total Bilirubin: 0.9 mg/dL (ref 0.2–1.2)
Total Protein: 7 g/dL (ref 6.1–8.1)

## 2017-05-21 LAB — HEMOGLOBIN A1C
Hgb A1c MFr Bld: 8.2 % — ABNORMAL HIGH (ref <5.7)
MEAN PLASMA GLUCOSE: 189 mg/dL

## 2017-05-21 LAB — LIPID PANEL
CHOL/HDL RATIO: 4.2 ratio (ref <5.0)
CHOLESTEROL: 151 mg/dL (ref <200)
HDL: 36 mg/dL — AB (ref >40)
LDL Cholesterol: 75 mg/dL (ref <100)
Triglycerides: 202 mg/dL — ABNORMAL HIGH (ref <150)
VLDL: 40 mg/dL — ABNORMAL HIGH (ref <30)

## 2017-07-23 ENCOUNTER — Other Ambulatory Visit: Payer: Self-pay | Admitting: Family Medicine

## 2017-07-27 ENCOUNTER — Ambulatory Visit (HOSPITAL_COMMUNITY)
Admission: RE | Admit: 2017-07-27 | Discharge: 2017-07-27 | Disposition: A | Discharge status: Home / Self Care | Payer: PPO | Source: Ambulatory Visit | Attending: Family | Admitting: Family

## 2017-07-27 ENCOUNTER — Ambulatory Visit (INDEPENDENT_AMBULATORY_CARE_PROVIDER_SITE_OTHER): Payer: PPO | Admitting: Family

## 2017-07-27 ENCOUNTER — Encounter: Payer: Self-pay | Admitting: Family

## 2017-07-27 VITALS — BP 131/76 | HR 74 | Temp 97.5°F | Resp 20 | Ht 64.0 in | Wt 178.0 lb

## 2017-07-27 DIAGNOSIS — Z87891 Personal history of nicotine dependence: Secondary | ICD-10-CM

## 2017-07-27 DIAGNOSIS — Z9889 Other specified postprocedural states: Secondary | ICD-10-CM

## 2017-07-27 DIAGNOSIS — I6523 Occlusion and stenosis of bilateral carotid arteries: Secondary | ICD-10-CM | POA: Insufficient documentation

## 2017-07-27 LAB — VAS US CAROTID
LCCAPSYS: 72 cm/s
LEFT ECA DIAS: -22 cm/s
Left CCA dist dias: -14 cm/s
Left CCA dist sys: -58 cm/s
Left CCA prox dias: 17 cm/s
Left ICA dist dias: -34 cm/s
Left ICA dist sys: -156 cm/s
Left ICA prox dias: -64 cm/s
Left ICA prox sys: -182 cm/s
RCCADSYS: -109 cm/s
RCCAPDIAS: 12 cm/s
RIGHT CCA MID DIAS: 17 cm/s
Right CCA prox sys: 35 cm/s

## 2017-07-27 NOTE — Patient Instructions (Signed)
Stroke Prevention Some medical conditions and behaviors are associated with an increased chance of having a stroke. You may prevent a stroke by making healthy choices and managing medical conditions. How can I reduce my risk of having a stroke?  Stay physically active. Get at least 30 minutes of activity on most or all days.  Do not smoke. It may also be helpful to avoid exposure to secondhand smoke.  Limit alcohol use. Moderate alcohol use is considered to be: ? No more than 2 drinks per day for men. ? No more than 1 drink per day for nonpregnant women.  Eat healthy foods. This involves: ? Eating 5 or more servings of fruits and vegetables a day. ? Making dietary changes that address high blood pressure (hypertension), high cholesterol, diabetes, or obesity.  Manage your cholesterol levels. ? Making food choices that are high in fiber and low in saturated fat, trans fat, and cholesterol may control cholesterol levels. ? Take any prescribed medicines to control cholesterol as directed by your health care provider.  Manage your diabetes. ? Controlling your carbohydrate and sugar intake is recommended to manage diabetes. ? Take any prescribed medicines to control diabetes as directed by your health care provider.  Control your hypertension. ? Making food choices that are low in salt (sodium), saturated fat, trans fat, and cholesterol is recommended to manage hypertension. ? Ask your health care provider if you need treatment to lower your blood pressure. Take any prescribed medicines to control hypertension as directed by your health care provider. ? If you are 18-39 years of age, have your blood pressure checked every 3-5 years. If you are 40 years of age or older, have your blood pressure checked every year.  Maintain a healthy weight. ? Reducing calorie intake and making food choices that are low in sodium, saturated fat, trans fat, and cholesterol are recommended to manage  weight.  Stop drug abuse.  Avoid taking birth control pills. ? Talk to your health care provider about the risks of taking birth control pills if you are over 35 years old, smoke, get migraines, or have ever had a blood clot.  Get evaluated for sleep disorders (sleep apnea). ? Talk to your health care provider about getting a sleep evaluation if you snore a lot or have excessive sleepiness.  Take medicines only as directed by your health care provider. ? For some people, aspirin or blood thinners (anticoagulants) are helpful in reducing the risk of forming abnormal blood clots that can lead to stroke. If you have the irregular heart rhythm of atrial fibrillation, you should be on a blood thinner unless there is a good reason you cannot take them. ? Understand all your medicine instructions.  Make sure that other conditions (such as anemia or atherosclerosis) are addressed. Get help right away if:  You have sudden weakness or numbness of the face, arm, or leg, especially on one side of the body.  Your face or eyelid droops to one side.  You have sudden confusion.  You have trouble speaking (aphasia) or understanding.  You have sudden trouble seeing in one or both eyes.  You have sudden trouble walking.  You have dizziness.  You have a loss of balance or coordination.  You have a sudden, severe headache with no known cause.  You have new chest pain or an irregular heartbeat. Any of these symptoms may represent a serious problem that is an emergency. Do not wait to see if the symptoms will go away.   Get medical help at once. Call your local emergency services (911 in [place] Do not drive yourself to the hospital. This information is not intended to replace advice given to you by your health care provider. Make sure you discuss any questions you have with your health care provider. Document Released: 11/05/2004 Document Revised: 03/05/2016 Document Reviewed: 03/31/2013 Elsevier  Interactive Patient Education  2017 Elsevier Inc.     Preventing Cerebrovascular Disease Arteries are blood vessels that carry blood that contains oxygen from the heart to all parts of the body. Cerebrovascular disease affects arteries that supply the brain. Any condition that blocks or disrupts blood flow to the brain can cause cerebrovascular disease. Brain cells that lose blood supply start to die within minutes (stroke). Stroke is the main danger of cerebrovascular disease. Atherosclerosis and high blood pressure are common causes of cerebrovascular disease. Atherosclerosis is narrowing and hardening of an artery that results when fat, cholesterol, calcium, or other substances (plaque) build up inside an artery. Plaque reduces blood flow through the artery. High blood pressure increases the risk of bleeding inside the brain. Making diet and lifestyle changes to prevent atherosclerosis and high blood pressure lowers your risk of cerebrovascular disease. What nutrition changes can be made?  Eat more fruits, vegetables, and whole grains.  Reduce how much saturated fat you eat. To do this, eat less red meat and fewer full-fat dairy products.  Eat healthy proteins instead of red meat. Healthy proteins include: ? Fish. Eat fish that contains heart-healthy omega-3 fatty acids, twice a week. Examples include salmon, albacore tuna, mackerel, and herring. ? Chicken. ? Nuts. ? Low-fat or nonfat yogurt.  Avoid processed meats, like bacon and lunchmeat.  Avoid foods that contain: ? A lot of sugar, such as sweets and drinks with added sugar. ? A lot of salt (sodium). Avoid adding extra salt to your food, as told by your health care provider. ? Trans fats, such as margarine and baked goods. Trans fats may be listed as "partially hydrogenated oils" on food labels.  Check food labels to see how much sodium, sugar, and trans fats are in foods.  Use vegetable oils that contain low amounts of  saturated fat, such as olive oil or canola oil. What lifestyle changes can be made?  Drink alcohol in moderation. This means no more than 1 drink a day for nonpregnant women and 2 drinks a day for men. One drink equals 12 oz of beer, 5 oz of wine, or 1 oz of hard liquor.  If you are overweight, ask your health care provider to recommend a weight-loss plan for you. Losing 5-10 lb (2.2-4.5 kg) can reduce your risk of diabetes, atherosclerosis, and high blood pressure.  Exercise for 30?60 minutes on most days, or as much as told by your health care provider. ? Do moderate-intensity exercise, such as brisk walking, bicycling, and water aerobics. Ask your health care provider which activities are safe for you.  Do not use any products that contain nicotine or tobacco, such as cigarettes and e-cigarettes. If you need help quitting, ask your health care provider. Why are these changes important? Making these changes lowers your risk of many diseases that can cause cerebrovascular disease and stroke. Stroke is a leading cause of death and disability. Making these changes also improves your overall health and quality of life. What can I do to lower my risk? The following factors make you more likely to develop cerebrovascular disease:  Being overweight.  Smoking.  Being physically inactive.    Eating a high-fat diet.  Having certain health conditions, such as: ? Diabetes. ? High blood pressure. ? Heart disease. ? Atherosclerosis. ? High cholesterol. ? Sickle cell disease.  Talk with your health care provider about your risk for cerebrovascular disease. Work with your health care provider to control diseases that you have that may contribute to cerebrovascular disease. Your health care provider may prescribe medicines to help prevent major causes of cerebrovascular disease. Where to find more information: Learn more about preventing cerebrovascular disease from:  National Heart, Lung, and  Blood Institute: www.nhlbi.nih.gov/health/health-topics/topics/stroke  Centers for Disease Control and Prevention: cdc.gov/stroke/about.htm  Summary  Cerebrovascular disease can lead to a stroke.  Atherosclerosis and high blood pressure are major causes of cerebrovascular disease.  Making diet and lifestyle changes can reduce your risk of cerebrovascular disease.  Work with your health care provider to get your risk factors under control to reduce your risk of cerebrovascular disease. This information is not intended to replace advice given to you by your health care provider. Make sure you discuss any questions you have with your health care provider. Document Released: 10/13/2015 Document Revised: 04/17/2016 Document Reviewed: 10/13/2015 Elsevier Interactive Patient Education  2018 Elsevier Inc.  

## 2017-07-27 NOTE — Progress Notes (Signed)
Chief Complaint: Follow up Extracranial Carotid Artery Stenosis   History of Present Illness  [name] is a 83 y.o. male who is status post right carotid endarterectomy in 2008 and redo 2013 by Dr. Donnetta Hutching. He was seen July 2014 with duplex suggesting recurrent severe stenosis in his right internal carotid artery after endarterectomy in 2013. He underwent catheter-based arteriogram at that same setting and this showed 50% stenosis with significant change in caliber from the endarterectomy site to the internal carotid artery.   The sebaceous cyst on his right CEA well healed incision has healed, but pt states it drains occasionally.   Hehas no history of TIA or stroke symptoms. Specifically the patient denies a history of amaurosis fugax or monocular blindness, unilateral facial drooping, hemiplegia, or receptive or expressive aphasia.   He denies claudication symptoms in legs with walking, denies non healing wounds. He does report occasional pain in both knees.   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    Pt Diabetic: Yes, review of records: A1C on 7/19/17was 9.2, improved to 8.2 on 05-20-17, states he was started on insulin about May of 2016 Pt smoker: former smoker, quit in 1993  Pt meds include: Statin : no Beta blocker: yes ASA: Yes Other anticoagulants/antiplatelets: no    Past Medical History:  Diagnosis Date  . Arthritis    knee  . Carotid artery occlusion   . Diabetes mellitus    metformin,januvia,and glipizide daily  . Enlarged prostate    pt states no trouble with it  . Hyperlipidemia    takes Simvastatin daily  . Hypertension    takes Atenolol and Lisinopril daily  . Impaired hearing, left    but doesn't wear hearing aids  . Seasonal allergies     Social History Social History  Substance Use Topics  . Smoking status: Former Smoker    Years: 25.00    Types: Cigarettes    Quit  date: 10/13/1991  . Smokeless tobacco: Never Used  . Alcohol use No     Comment: occassional glass of wine    Family History Family History  Problem Relation Age of Onset  . Cancer Mother        Leukemia  . Leukemia Mother   . Asthma Father   . Anesthesia problems Neg Hx   . Hypotension Neg Hx   . Malignant hyperthermia Neg Hx   . Pseudochol deficiency Neg Hx     Surgical History Past Surgical History:  Procedure Laterality Date  . APPENDECTOMY     as a child  . CAROTID ANGIOGRAM N/A 05/09/2013   Procedure: CAROTID ANGIOGRAM;  Surgeon: Serafina Mitchell, MD;  Location: [facility] CATH LAB;  Service: Cardiovascular;  Laterality: N/A;  . CAROTID ENDARTERECTOMY Right 2008    Re-do Mar 04, 2012  . carotidendartectomy  2008   right side  . ENDARTERECTOMY  03/04/2012   Procedure: ENDARTERECTOMY CAROTID;  Surgeon: Rosetta Posner, MD;  Location: [facility] OR;  Service: Vascular;  Laterality: Right;  Redo Right Carotid Endarterectomy with hemasheild patch angioplasty    No Known Allergies  Current Outpatient Prescriptions  Medication Sig Dispense Refill  . aspirin EC 81 MG tablet Take 81 mg by mouth daily.    [name] Kitchen atenolol (TENORMIN) 50 MG tablet TAKE 1 TABLET BY MOUTH DAILY 30 tablet 11  . LEVEMIR 100 UNIT/ML injection INJECT 15 UNITS SUBCUTANEOUSLY AT BEDTIME . INCREASE BY 1 UNIT  UNTIL BS IS BELOW 130 10 mL 3  . lisinopril (PRINIVIL,ZESTRIL) 20 MG tablet TAKE 1 TABLET (20 MG TOTAL) BY MOUTH DAILY. 30 tablet 11  . metFORMIN (GLUCOPHAGE) 1000 MG tablet TAKE 1 TABLET TWICE A DAY WITH A MEAL 180 tablet 3  . pioglitazone (ACTOS) 30 MG tablet TAKE 1 TABLET BY MOUTH EVERY DAY 90 tablet 3  . ASSURE COMFORT LANCETS 30G MISC     . atenolol (TENORMIN) 50 MG tablet Take 1 tablet (50 mg total) by mouth daily. (Patient not taking: Reported on 07/27/2017) 90 tablet 3  . BD INSULIN SYRINGE ULTRAFINE 31G X 5/16" 0.3 ML MISC     . Blood Glucose Calibration (OT ULTRA/FASTTK CNTRL SOLN) SOLN     . Blood Glucose  Monitoring Suppl (ONE TOUCH ULTRA 2) w/Device KIT Checks BS BID - TID E11.9 (Patient not taking: Reported on 07/27/2017) 1 each 0  . Insulin Pen Needle (PEN NEEDLES 31GX5/16") 31G X 8 MM MISC Use with pen qd (Patient not taking: Reported on 07/27/2017) 100 each 5  . Lancet Devices (ADJUSTABLE LANCING DEVICE) MISC     . ONE TOUCH ULTRA TEST test strip CHECK BLOOD SUGAR 2 TO 3 TIMES DAILY E11.9 (Patient not taking: Reported on 07/27/2017) 100 each 4  . UNABLE TO FIND Pt uses BD insulin syringes - 3/10 cc - 33m - 31G (Patient not taking: Reported on 07/27/2017) 100 Syringe 11   No current facility-administered medications for this visit.     Review of Systems : See HPI for pertinent positives and negatives.  Physical Examination  Vitals:   07/27/17 0950 07/27/17 0952  BP: 140/73 131/76  Pulse: 74   Resp: 20   Temp: (!) 97.5 F (36.4 C)   TempSrc: Oral   SpO2: 97%   Weight: 178 lb (80.7 kg)   Height: _0  (1.626 m)    Body mass index is 30.55 kg/m.  General: WDWN obese male in NAD GAIT:normal Eyes: PERRLA Pulmonary: Respirations are non-labored, CTAB, good air movement in all fields Cardiac: regular rhythm, no detected murmur.  VASCULAR EXAM Carotid Bruits Right Left   Negative Negative    Abdominal aortic pulse is not palpable. Radial pulses are 2+ palpable and equal. Pedal pulses are palpable.   Gastrointestinal: soft, nontender, BS WNL, no r/g, no palpable masses.  Musculoskeletal: No muscle atrophy/wasting. M/S 5/5 throughout, Extremities without ischemic changes.  Neurologic: A&O X 3; appropriate affect, speech is loud, CN 2-12 intact except is hard of hearing, Pain and light touch intact in extremities, Motor exam as listed above.    Assessment: Maurice CRYSLERis a 83y.o. male who is status post right carotid endarterectomy  in 2008 and redo 2013. He was seen July 2014 with duplex suggesting recurrent severe stenosis in his right internal carotid artery after endarterectomy in 2013. He underwent catheter-based arteriogram at that same setting and this showed 50% stenosis with significant change in caliber from the endarterectomy site to the internal carotid artery.  He has no history of stroke or TIA.  His atherosclerotic risk factors include uncontrolled DM and former smoker.  Pt is no longer taking a statin, he does not know why. Consider resuming the statin to decrease his risk of CVD events, unless there are contraindications to do so for this pt; defer to his PCP.    DATA  Carotid Duplex (01/12/17) : Right ICA: CEA site with  increased proximal ICA velocity (40-59%) which appears to be due to a change in  vessel diameter. Left ICA: 40-59% stenosis (high end of range). Bilateral vertebral artery flow is antegrade.  Bilateral subclavian artery waveforms are normal.  No significant change compared to the exam on 01-12-17.    Plan: I advised [name] to continue to work closely with Dr. Dennard Schaumann to get his DM under as good control as possible to reduce his risk of stroke, MI, neuropathic pain, eye complications, kidney failure, etc as complications of uncontrolled DM.   Follow-up in 6 monthswith Carotid Duplex scan.   I discussed in depth with the patient the nature of atherosclerosis, and emphasized the importance of maximal medical management including strict control of blood pressure, blood glucose, and lipid levels, obtaining regular exercise, and cessation of smoking.  The patient is aware that without maximal medical management the underlying atherosclerotic disease process will progress, limiting the benefit of any interventions. The patient was given information about stroke prevention and what symptoms should prompt the patient to seek immediate medical care. Thank you for allowing [place] to  participate in this patient's care.  Clemon Chambers, RN, MSN, FNP-C [facility] Office: [facility] Physician: Early  07/27/17 10:06 AM

## 2017-08-11 ENCOUNTER — Other Ambulatory Visit: Payer: Self-pay | Admitting: Family Medicine

## 2017-08-16 NOTE — Addendum Note (Signed)
Addended by: Lianne Cure A on: 08/16/2017 04:19 PM   Modules accepted: Orders

## 2017-09-11 ENCOUNTER — Other Ambulatory Visit: Payer: Self-pay | Admitting: Family Medicine

## 2017-11-18 ENCOUNTER — Telehealth: Payer: Self-pay | Admitting: Family Medicine

## 2018-02-01 ENCOUNTER — Ambulatory Visit: Payer: PPO | Admitting: Family

## 2018-02-01 ENCOUNTER — Encounter (HOSPITAL_COMMUNITY): Payer: PPO

## 2018-03-29 ENCOUNTER — Ambulatory Visit (INDEPENDENT_AMBULATORY_CARE_PROVIDER_SITE_OTHER): Payer: PPO | Admitting: Family Medicine

## 2018-03-29 ENCOUNTER — Encounter: Payer: Self-pay | Admitting: Family Medicine

## 2018-03-29 VITALS — BP 134/76 | HR 116 | Temp 98.4°F | Resp 18 | Ht 64.0 in | Wt 183.0 lb

## 2018-03-29 DIAGNOSIS — I10 Essential (primary) hypertension: Secondary | ICD-10-CM

## 2018-03-29 DIAGNOSIS — E7800 Pure hypercholesterolemia, unspecified: Secondary | ICD-10-CM | POA: Diagnosis not present

## 2018-03-29 DIAGNOSIS — E1100 Type 2 diabetes mellitus with hyperosmolarity without nonketotic hyperglycemic-hyperosmolar coma (NKHHC): Secondary | ICD-10-CM

## 2018-03-29 DIAGNOSIS — I2510 Atherosclerotic heart disease of native coronary artery without angina pectoris: Secondary | ICD-10-CM | POA: Diagnosis not present

## 2018-03-29 DIAGNOSIS — Z794 Long term (current) use of insulin: Secondary | ICD-10-CM | POA: Diagnosis not present

## 2018-03-29 DIAGNOSIS — R000 Tachycardia, unspecified: Secondary | ICD-10-CM | POA: Diagnosis not present

## 2018-03-29 MED ORDER — FUROSEMIDE 20 MG PO TABS: 20.0000 mg | ORAL_TABLET | Freq: Every day | ORAL | 0 refills | Status: DC

## 2018-03-29 MED ORDER — ATENOLOL 50 MG PO TABS: 50.0000 mg | ORAL_TABLET | Freq: Every day | ORAL | 3 refills | Status: DC

## 2018-03-29 NOTE — Progress Notes (Signed)
Subjective:    Patient ID: [name], male    DOB: Aug 04, 1931, 84 y.o.   MRN: [id number]  Medication Refill     Patient has not been seen since August of last year.  At that time, he weighed 176 pounds.  He is gained 7 pounds since his last visit.  He is concerned because he has +1 pitting edema to his mid shin bilaterally and also pitting edema in both arms.  I believe some of his weight gain is due to edema.  He states that he has noticed this over the last few weeks.  He has been on Actos for quite some time for his diabetes and has never had a problem with swelling up until now.  He does admit that he has been eating a lot of salt recently on tomato sandwiches as well as on his watermelon.  He states that he is eating salt regularly every day.  He is also stopped taking his atenolol for some reason and his heart rate is extremely rapid today.  I calculated his heart rate to be in the 130s during his exam.  Perhaps this could also be contributing to his pitting edema he denies any chest pain shortness of breath or dyspnea on exertion.  He has not been checking his blood sugars but he denies any polyuria, polydipsia, or blurry vision.  He denies any myalgias or right upper quadrant pain. Past Medical History:  Diagnosis Date  . Arthritis    knee  . Carotid artery occlusion   . Diabetes mellitus    metformin,januvia,and glipizide daily  . Enlarged prostate    pt states no trouble with it  . Hyperlipidemia    takes Simvastatin daily  . Hypertension    takes Atenolol and Lisinopril daily  . Impaired hearing, left    but doesn't wear hearing aids  . Seasonal allergies    Past Surgical History:  Procedure Laterality Date  . APPENDECTOMY     as a child  . CAROTID ANGIOGRAM N/A 05/09/2013   Procedure: CAROTID ANGIOGRAM;  Surgeon: Serafina Mitchell, MD;  Location: [facility] CATH LAB;  Service: Cardiovascular;  Laterality: N/A;  . CAROTID ENDARTERECTOMY Right 2008    Re-do Mar 04, 2012  .  carotidendartectomy  2008   right side  . ENDARTERECTOMY  03/04/2012   Procedure: ENDARTERECTOMY CAROTID;  Surgeon: Rosetta Posner, MD;  Location: [facility] OR;  Service: Vascular;  Laterality: Right;  Redo Right Carotid Endarterectomy with hemasheild patch angioplasty   Current Outpatient Medications on File Prior to Visit  Medication Sig Dispense Refill  . aspirin EC 81 MG tablet Take 81 mg by mouth daily.    . ASSURE COMFORT LANCETS 30G MISC     . atenolol (TENORMIN) 50 MG tablet TAKE 1 TABLET BY MOUTH DAILY 30 tablet 11  . atenolol (TENORMIN) 50 MG tablet Take 1 tablet (50 mg total) by mouth daily. (Patient not taking: Reported on 07/27/2017) 90 tablet 3  . BD INSULIN SYRINGE ULTRAFINE 31G X 5/16" 0.3 ML MISC     . Blood Glucose Calibration (OT ULTRA/FASTTK CNTRL SOLN) SOLN     . Blood Glucose Monitoring Suppl (ONE TOUCH ULTRA 2) w/Device KIT Checks BS BID - TID E11.9 (Patient not taking: Reported on 07/27/2017) 1 each 0  . Insulin Pen Needle (PEN NEEDLES 31GX5/16") 31G X 8 MM MISC Use with pen qd (Patient not taking: Reported on 07/27/2017) 100 each 5  . Lancet Devices (ADJUSTABLE LANCING DEVICE)  MISC     . LEVEMIR 100 UNIT/ML injection INJECT 15 UNITS SUBCUTANEOUSLY AT BEDTIME . INCREASE BY 1 UNIT UNTIL BLOOD SUGAR IS BELOW 130 10 mL 3  . lisinopril (PRINIVIL,ZESTRIL) 20 MG tablet TAKE 1 TABLET (20 MG TOTAL) BY MOUTH DAILY. 90 tablet 3  . metFORMIN (GLUCOPHAGE) 1000 MG tablet TAKE 1 TABLET TWICE A DAY WITH A MEAL 180 tablet 3  . ONE TOUCH ULTRA TEST test strip CHECK BLOOD SUGAR 2 TO 3 TIMES DAILY E11.9 (Patient not taking: Reported on 07/27/2017) 100 each 4  . pioglitazone (ACTOS) 30 MG tablet TAKE 1 TABLET BY MOUTH EVERY DAY 90 tablet 3  . UNABLE TO FIND Pt uses BD insulin syringes - 3/10 cc - 44m - 31G (Patient not taking: Reported on 07/27/2017) 100 Syringe 11   No current facility-administered medications on file prior to visit.    No Known Allergies Social History   Socioeconomic History   . Marital status: Married    Spouse name: Not on file  . Number of children: Not on file  . Years of education: Not on file  . Highest education level: Not on file  Occupational History  . Not on file  Social Needs  . Financial resource strain: Not on file  . Food insecurity:    Worry: Not on file    Inability: Not on file  . Transportation needs:    Medical: Not on file    Non-medical: Not on file  Tobacco Use  . Smoking status: Former Smoker    Years: 25.00    Types: Cigarettes    Last attempt to quit: 10/13/1991    Years since quitting: 26.4  . Smokeless tobacco: Never Used  Substance and Sexual Activity  . Alcohol use: No    Comment: occassional glass of wine  . Drug use: No  . Sexual activity: Yes  Lifestyle  . Physical activity:    Days per week: Not on file    Minutes per session: Not on file  . Stress: Not on file  Relationships  . Social connections:    Talks on phone: Not on file    Gets together: Not on file    Attends religious service: Not on file    Active member of club or organization: Not on file    Attends meetings of clubs or organizations: Not on file    Relationship status: Not on file  . Intimate partner violence:    Fear of current or ex partner: Not on file    Emotionally abused: Not on file    Physically abused: Not on file    Forced sexual activity: Not on file  Other Topics Concern  . Not on file  Social History Narrative  . Not on file      Review of Systems  All other systems reviewed and are negative.      Objective:   Physical Exam  Constitutional: He appears well-developed and well-nourished. No distress.  Neck: Neck supple. No JVD present. No thyromegaly present.  Cardiovascular: Normal rate, regular rhythm, normal heart sounds and intact distal pulses. Exam reveals no gallop and no friction rub.  No murmur heard. Pulmonary/Chest: Effort normal and breath sounds normal. No respiratory distress. He has no wheezes. He has  no rales.  Abdominal: Soft. Bowel sounds are normal. He exhibits no distension and no mass. There is no tenderness. There is no rebound and no guarding.  Musculoskeletal: He exhibits no edema.  Lymphadenopathy:  He has no cervical adenopathy.  Skin: He is not diaphoretic.  Vitals reviewed.         Assessment & Plan:  ASCVD (arteriosclerotic cardiovascular disease)  Essential hypertension  Uncontrolled type 2 diabetes mellitus with hyperosmolarity without coma, with long-term current use of insulin ([place]) - Plan: CBC with Differential/Platelet, COMPLETE METABOLIC PANEL WITH GFR, Lipid panel, Microalbumin, urine, Hemoglobin A1c  Pure hypercholesterolemia  Tachycardia - Plan: EKG 12-Lead, TSH Patient is very nice but noncompliant.  I believe his peripheral edema could be multifactorial and related to his tachycardia, his increased sodium intake, and his use of Actos.  Obtain an EKG today to evaluate for the potential causes of his tachycardia.  Discontinue sodium/salt consumption.  Check CBC, CMP, fasting lipid panel, hemoglobin A1c, and urine microalbumin.  Given his advanced age, his noncompliance, and his medical comorbidities, I would be happy with a hemoglobin A1c less than 7.5.  Because of his tachycardia also check TSH.  Resume atenolol to help with tachycardia.  Use Lasix 20 mg p.o. daily and recheck patient in 1 week.  EKG today shows sinus tachycardia but no atrial fibrillation.  He does have a right axis but no evidence of ischemia or infarction.  Patient reports that he is on 13 units of insulin/Levemir a day however he is not certain but he believes that is what he is drawing up in the vial.  He is not checking his sugar.  Await the results of his hemoglobin A1c to determine what the next step would be in his management.  He may be a better candidate for Trulicity and discontinuation of insulin if his A1c is reasonable.

## 2018-04-11 ENCOUNTER — Other Ambulatory Visit: Payer: Self-pay | Admitting: Family Medicine

## 2018-04-11 ENCOUNTER — Ambulatory Visit: Payer: PPO | Admitting: Family Medicine

## 2018-04-11 ENCOUNTER — Encounter: Payer: Self-pay | Admitting: Family Medicine

## 2018-04-11 ENCOUNTER — Ambulatory Visit (INDEPENDENT_AMBULATORY_CARE_PROVIDER_SITE_OTHER): Payer: PPO | Admitting: Family Medicine

## 2018-04-11 VITALS — BP 138/80 | HR 78 | Temp 97.5°F | Resp 18 | Ht 64.0 in | Wt 179.0 lb

## 2018-04-11 DIAGNOSIS — Z794 Long term (current) use of insulin: Secondary | ICD-10-CM

## 2018-04-11 DIAGNOSIS — E1100 Type 2 diabetes mellitus with hyperosmolarity without nonketotic hyperglycemic-hyperosmolar coma (NKHHC): Secondary | ICD-10-CM

## 2018-04-11 DIAGNOSIS — I10 Essential (primary) hypertension: Secondary | ICD-10-CM

## 2018-04-11 MED ORDER — LIRAGLUTIDE 18 MG/3ML ~~LOC~~ SOPN: 1.2000 mg | PEN_INJECTOR | Freq: Every day | SUBCUTANEOUS | 2 refills | Status: DC

## 2018-04-11 MED ORDER — INSULIN PEN NEEDLE 32G X 4 MM MISC: 3 refills | Status: DC

## 2018-04-11 NOTE — Progress Notes (Signed)
Subjective:    Patient ID: [name], male    DOB: 16-Dec-1930, 84 y.o.   MRN: [id number]  Medication Refill    03/29/18 Patient has not been seen since August of last year.  At that time, he weighed 176 pounds.  He is gained 7 pounds since his last visit.  He is concerned because he has +1 pitting edema to his mid shin bilaterally and also pitting edema in both arms.  I believe some of his weight gain is due to edema.  He states that he has noticed this over the last few weeks.  He has been on Actos for quite some time for his diabetes and has never had a problem with swelling up until now.  He does admit that he has been eating a lot of salt recently on tomato sandwiches as well as on his watermelon.  He states that he is eating salt regularly every day.  He is also stopped taking his atenolol for some reason and his heart rate is extremely rapid today.  I calculated his heart rate to be in the 130s during his exam.  Perhaps this could also be contributing to his pitting edema he denies any chest pain shortness of breath or dyspnea on exertion.  He has not been checking his blood sugars but he denies any polyuria, polydipsia, or blurry vision.  He denies any myalgias or right upper quadrant pain.  At that time, my plan was: Patient is very nice but noncompliant.  I believe his peripheral edema could be multifactorial and related to his tachycardia, his increased sodium intake, and his use of Actos.  Obtain an EKG today to evaluate for the potential causes of his tachycardia.  Discontinue sodium/salt consumption.  Check CBC, CMP, fasting lipid panel, hemoglobin A1c, and urine microalbumin.  Given his advanced age, his noncompliance, and his medical comorbidities, I would be happy with a hemoglobin A1c less than 7.5.  Because of his tachycardia also check TSH.  Resume atenolol to help with tachycardia.  Use Lasix 20 mg p.o. daily and recheck patient in 1 week.  EKG today shows sinus tachycardia but no  atrial fibrillation.  He does have a right axis but no evidence of ischemia or infarction.  Patient reports that he is on 13 units of insulin/Levemir a day however he is not certain but he believes that is what he is drawing up in the vial.  He is not checking his sugar.  Await the results of his hemoglobin A1c to determine what the next step would be in his management.  He may be a better candidate for Trulicity and discontinuation of insulin if his A1c is reasonable.  04/11/18 Hemoglobin A1c was found to be elevated at 7.8.  However the patient is not checking his blood sugar at all.  He also is easily confused and therefore I do not believe that uptitrating his insulin is a safe option for him.  Therefore I unilaterally decided to start him on Victoza and replace his insulin with the Victoza.  He is here today with his grandson to discuss this option to ensure no misunderstanding and to make sure that the patient understands the medication changes that need to be made.  Since starting Lasix and atenolol, his heart rate has slowed dramatically and he has diuresed approximately 4 pounds.  He denies any dry mouth or dizziness or orthostatic hypotension Past Medical History:  Diagnosis Date  . Arthritis    knee  .  Carotid artery occlusion   . Diabetes mellitus    metformin,januvia,and glipizide daily  . Enlarged prostate    pt states no trouble with it  . Hyperlipidemia    takes Simvastatin daily  . Hypertension    takes Atenolol and Lisinopril daily  . Impaired hearing, left    but doesn't wear hearing aids  . Seasonal allergies    Past Surgical History:  Procedure Laterality Date  . APPENDECTOMY     as a child  . CAROTID ANGIOGRAM N/A 05/09/2013   Procedure: CAROTID ANGIOGRAM;  Surgeon: Serafina Mitchell, MD;  Location: [facility] CATH LAB;  Service: Cardiovascular;  Laterality: N/A;  . CAROTID ENDARTERECTOMY Right 2008    Re-do Mar 04, 2012  . carotidendartectomy  2008   right side  .  ENDARTERECTOMY  03/04/2012   Procedure: ENDARTERECTOMY CAROTID;  Surgeon: Rosetta Posner, MD;  Location: [facility] OR;  Service: Vascular;  Laterality: Right;  Redo Right Carotid Endarterectomy with hemasheild patch angioplasty   Current Outpatient Medications on File Prior to Visit  Medication Sig Dispense Refill  . aspirin EC 81 MG tablet Take 81 mg by mouth daily.    [name] Kitchen atenolol (TENORMIN) 50 MG tablet TAKE 1 TABLET BY MOUTH DAILY 30 tablet 11  . furosemide (LASIX) 20 MG tablet Take 1 tablet (20 mg total) by mouth daily. 30 tablet 0  . LEVEMIR 100 UNIT/ML injection INJECT 15 UNITS SUBCUTANEOUSLY AT BEDTIME . INCREASE BY 1 UNIT UNTIL BLOOD SUGAR IS BELOW 130 (Patient taking differently: INJECT 14 UNITS SUBCUTANEOUSLY AT BEDTIME . INCREASE BY 1 UNIT UNTIL BLOOD SUGAR IS BELOW 130) 10 mL 3  . lisinopril (PRINIVIL,ZESTRIL) 20 MG tablet TAKE 1 TABLET (20 MG TOTAL) BY MOUTH DAILY. 90 tablet 3  . metFORMIN (GLUCOPHAGE) 1000 MG tablet TAKE 1 TABLET TWICE A DAY WITH A MEAL 180 tablet 3  . pioglitazone (ACTOS) 30 MG tablet TAKE 1 TABLET BY MOUTH EVERY DAY 90 tablet 3   No current facility-administered medications on file prior to visit.    No Known Allergies Social History   Socioeconomic History  . Marital status: Married    Spouse name: Not on file  . Number of children: Not on file  . Years of education: Not on file  . Highest education level: Not on file  Occupational History  . Not on file  Social Needs  . Financial resource strain: Not on file  . Food insecurity:    Worry: Not on file    Inability: Not on file  . Transportation needs:    Medical: Not on file    Non-medical: Not on file  Tobacco Use  . Smoking status: Former Smoker    Years: 25.00    Types: Cigarettes    Last attempt to quit: 10/13/1991    Years since quitting: 26.5  . Smokeless tobacco: Never Used  Substance and Sexual Activity  . Alcohol use: No    Comment: occassional glass of wine  . Drug use: No  . Sexual activity:  Yes  Lifestyle  . Physical activity:    Days per week: Not on file    Minutes per session: Not on file  . Stress: Not on file  Relationships  . Social connections:    Talks on phone: Not on file    Gets together: Not on file    Attends religious service: Not on file    Active member of club or organization: Not on file    Attends meetings of  clubs or organizations: Not on file    Relationship status: Not on file  . Intimate partner violence:    Fear of current or ex partner: Not on file    Emotionally abused: Not on file    Physically abused: Not on file    Forced sexual activity: Not on file  Other Topics Concern  . Not on file  Social History Narrative  . Not on file      Review of Systems  All other systems reviewed and are negative.      Objective:   Physical Exam  Constitutional: He appears well-developed and well-nourished. No distress.  Neck: Neck supple. No JVD present. No thyromegaly present.  Cardiovascular: Normal rate, regular rhythm, normal heart sounds and intact distal pulses. Exam reveals no gallop and no friction rub.  No murmur heard. Pulmonary/Chest: Effort normal and breath sounds normal. No respiratory distress. He has no wheezes. He has no rales.  Abdominal: Soft. Bowel sounds are normal. He exhibits no distension and no mass. There is no tenderness. There is no rebound and no guarding.  Musculoskeletal: He exhibits no edema.  Lymphadenopathy:    He has no cervical adenopathy.  Skin: He is not diaphoretic.  Vitals reviewed.         Assessment & Plan:  Uncontrolled type 2 diabetes mellitus with hyperosmolarity without coma, with long-term current use of insulin ([facility])  Essential hypertension  Blood pressure and heart rate are much improved on atenolol.  Continue this medication his current dose.  He can continue to use Lasix 20 mg a day to control fluid retention particularly on his Actos.  Monitor for evidence of dehydration.  Discontinue  insulin.  I believe uptitrating insulin would be dangerous for this patient due to understanding and his infrequent checking of his blood sugar.  I believe he is high risk for hypoglycemia.  Therefore I would replace insulin with Victoza 0.6 mg daily for 1 week and then increase to 1.2 mg daily thereafter.  His grandson understands and will remove the insulin from his medication cabinet.  We will recheck his fasting blood sugars and 2-hour postprandial sugars in 1 month.

## 2018-04-20 ENCOUNTER — Other Ambulatory Visit: Payer: Self-pay | Admitting: Family Medicine

## 2018-05-13 ENCOUNTER — Other Ambulatory Visit: Payer: Self-pay | Admitting: Family Medicine

## 2018-05-14 ENCOUNTER — Other Ambulatory Visit: Payer: Self-pay | Admitting: Family Medicine

## 2018-05-24 ENCOUNTER — Other Ambulatory Visit: Payer: Self-pay | Admitting: Family Medicine

## 2018-07-12 LAB — CBC WITH DIFFERENTIAL/PLATELET
BASOS PCT: 0.5 %
Basophils Absolute: 32 cells/uL (ref 0–200)
EOS PCT: 1.7 %
Eosinophils Absolute: 109 cells/uL (ref 15–500)
HEMATOCRIT: 41.8 % (ref 38.5–50.0)
HEMOGLOBIN: 14 g/dL (ref 13.2–17.1)
LYMPHS ABS: 2054 {cells}/uL (ref 850–3900)
MCH: 30.9 pg (ref 27.0–33.0)
MCHC: 33.5 g/dL (ref 32.0–36.0)
MCV: 92.3 fL (ref 80.0–100.0)
MPV: 9.8 fL (ref 7.5–12.5)
Monocytes Relative: 11.9 %
NEUTROS ABS: 3443 {cells}/uL (ref 1500–7800)
Neutrophils Relative %: 53.8 %
Platelets: 185 10*3/uL (ref 140–400)
RBC: 4.53 10*6/uL (ref 4.20–5.80)
RDW: 13 % (ref 11.0–15.0)
Total Lymphocyte: 32.1 %
WBC mixed population: 762 cells/uL (ref 200–950)
WBC: 6.4 10*3/uL (ref 3.8–10.8)

## 2018-07-12 LAB — LIPID PANEL
CHOL/HDL RATIO: 4.5 (calc) (ref <5.0)
CHOLESTEROL: 147 mg/dL (ref <200)
HDL: 33 mg/dL — ABNORMAL LOW (ref >40)
LDL CHOLESTEROL (CALC): 80 mg/dL
NON-HDL CHOLESTEROL (CALC): 114 mg/dL (ref <130)
Triglycerides: 259 mg/dL — ABNORMAL HIGH (ref <150)

## 2018-07-12 LAB — COMPLETE METABOLIC PANEL WITH GFR
AG Ratio: 1.6 (calc) (ref 1.0–2.5)
ALBUMIN MSPROF: 4.1 g/dL (ref 3.6–5.1)
ALT: 13 U/L (ref 9–46)
AST: 13 U/L (ref 10–35)
Alkaline phosphatase (APISO): 82 U/L (ref 40–115)
BUN/Creatinine Ratio: 15 (calc) (ref 6–22)
BUN: 21 mg/dL (ref 7–25)
CALCIUM: 9.2 mg/dL (ref 8.6–10.3)
CO2: 24 mmol/L (ref 20–32)
CREATININE: 1.41 mg/dL — AB (ref 0.70–1.11)
Chloride: 103 mmol/L (ref 98–110)
GFR, EST NON AFRICAN AMERICAN: 45 mL/min/{1.73_m2} — AB (ref >=60)
GFR, Est African American: 52 mL/min/{1.73_m2} — ABNORMAL LOW (ref >=60)
GLOBULIN: 2.5 g/dL (ref 1.9–3.7)
GLUCOSE: 302 mg/dL — AB (ref 65–99)
Potassium: 4.9 mmol/L (ref 3.5–5.3)
SODIUM: 137 mmol/L (ref 135–146)
Total Bilirubin: 0.8 mg/dL (ref 0.2–1.2)
Total Protein: 6.6 g/dL (ref 6.1–8.1)

## 2018-07-12 LAB — HEMOGLOBIN A1C
EAG (MMOL/L): 9.8 (calc)
HEMOGLOBIN A1C: 7.8 %{Hb} — AB (ref <5.7)
Mean Plasma Glucose: 177 (calc)

## 2018-07-12 LAB — TSH: TSH: 2.96 mIU/L (ref 0.40–4.50)

## 2018-07-12 LAB — MICROALBUMIN, URINE: MICROALB UR: 2.1 mg/dL

## 2018-07-13 ENCOUNTER — Ambulatory Visit (INDEPENDENT_AMBULATORY_CARE_PROVIDER_SITE_OTHER): Payer: PPO | Admitting: Family Medicine

## 2018-07-13 ENCOUNTER — Other Ambulatory Visit: Payer: PPO

## 2018-07-13 DIAGNOSIS — E7800 Pure hypercholesterolemia, unspecified: Secondary | ICD-10-CM | POA: Diagnosis not present

## 2018-07-13 DIAGNOSIS — I2510 Atherosclerotic heart disease of native coronary artery without angina pectoris: Secondary | ICD-10-CM | POA: Diagnosis not present

## 2018-07-13 DIAGNOSIS — Z794 Long term (current) use of insulin: Principal | ICD-10-CM

## 2018-07-13 DIAGNOSIS — E1100 Type 2 diabetes mellitus with hyperosmolarity without nonketotic hyperglycemic-hyperosmolar coma (NKHHC): Secondary | ICD-10-CM

## 2018-07-13 DIAGNOSIS — Z23 Encounter for immunization: Secondary | ICD-10-CM | POA: Diagnosis not present

## 2018-07-13 DIAGNOSIS — I10 Essential (primary) hypertension: Secondary | ICD-10-CM

## 2018-07-14 LAB — CBC WITH DIFFERENTIAL/PLATELET
BASOS ABS: 39 {cells}/uL (ref 0–200)
Basophils Relative: 0.6 %
EOS ABS: 111 {cells}/uL (ref 15–500)
Eosinophils Relative: 1.7 %
HEMATOCRIT: 38.2 % — AB (ref 38.5–50.0)
HEMOGLOBIN: 12.6 g/dL — AB (ref 13.2–17.1)
Lymphs Abs: 2282 cells/uL (ref 850–3900)
MCH: 31 pg (ref 27.0–33.0)
MCHC: 33 g/dL (ref 32.0–36.0)
MCV: 94.1 fL (ref 80.0–100.0)
MPV: 9.6 fL (ref 7.5–12.5)
Monocytes Relative: 13.6 %
NEUTROS ABS: 3185 {cells}/uL (ref 1500–7800)
NEUTROS PCT: 49 %
Platelets: 195 10*3/uL (ref 140–400)
RBC: 4.06 10*6/uL — ABNORMAL LOW (ref 4.20–5.80)
RDW: 13.2 % (ref 11.0–15.0)
Total Lymphocyte: 35.1 %
WBC mixed population: 884 cells/uL (ref 200–950)
WBC: 6.5 10*3/uL (ref 3.8–10.8)

## 2018-07-14 LAB — COMPREHENSIVE METABOLIC PANEL
AG Ratio: 1.9 (calc) (ref 1.0–2.5)
ALT: 12 U/L (ref 9–46)
AST: 12 U/L (ref 10–35)
Albumin: 4.1 g/dL (ref 3.6–5.1)
Alkaline phosphatase (APISO): 51 U/L (ref 40–115)
BILIRUBIN TOTAL: 1.1 mg/dL (ref 0.2–1.2)
BUN/Creatinine Ratio: 19 (calc) (ref 6–22)
BUN: 40 mg/dL — AB (ref 7–25)
CALCIUM: 9.4 mg/dL (ref 8.6–10.3)
CO2: 27 mmol/L (ref 20–32)
CREATININE: 2.06 mg/dL — AB (ref 0.70–1.11)
Chloride: 104 mmol/L (ref 98–110)
Globulin: 2.2 g/dL (calc) (ref 1.9–3.7)
Glucose, Bld: 119 mg/dL — ABNORMAL HIGH (ref 65–99)
POTASSIUM: 5.1 mmol/L (ref 3.5–5.3)
Sodium: 138 mmol/L (ref 135–146)
Total Protein: 6.3 g/dL (ref 6.1–8.1)

## 2018-07-14 LAB — HEMOGLOBIN A1C
HEMOGLOBIN A1C: 7.1 %{Hb} — AB (ref <5.7)
Mean Plasma Glucose: 157 (calc)
eAG (mmol/L): 8.7 (calc)

## 2018-07-14 LAB — LIPID PANEL
CHOL/HDL RATIO: 5.2 (calc) — AB (ref <5.0)
Cholesterol: 156 mg/dL (ref <200)
HDL: 30 mg/dL — ABNORMAL LOW (ref >40)
LDL CHOLESTEROL (CALC): 96 mg/dL
Non-HDL Cholesterol (Calc): 126 mg/dL (calc) (ref <130)
Triglycerides: 200 mg/dL — ABNORMAL HIGH (ref <150)

## 2018-07-15 ENCOUNTER — Other Ambulatory Visit: Payer: Self-pay | Admitting: Family Medicine

## 2018-08-04 ENCOUNTER — Encounter: Payer: Self-pay | Admitting: Family Medicine

## 2018-08-15 ENCOUNTER — Other Ambulatory Visit: Payer: Self-pay | Admitting: Family Medicine

## 2018-08-15 ENCOUNTER — Telehealth: Payer: Self-pay | Admitting: Family Medicine

## 2018-08-15 DIAGNOSIS — N289 Disorder of kidney and ureter, unspecified: Secondary | ICD-10-CM

## 2018-08-15 NOTE — Telephone Encounter (Signed)
Patient calling to speak to you regarding the letter about his kidney function form  [phone]

## 2018-08-15 NOTE — Telephone Encounter (Signed)
Pt's wife finally called back about labs (see results note) and results discussed and will stop Lasix and recheck labs in 1 week. Pt has not been taking Metformin.

## 2019-01-31 ENCOUNTER — Telehealth: Payer: Self-pay | Admitting: Family Medicine

## 2019-01-31 NOTE — Telephone Encounter (Signed)
Patient calling regarding his insulin being 1500 dollars, please call him at [phone]

## 2019-02-01 NOTE — Telephone Encounter (Signed)
LMTRC

## 2019-02-07 ENCOUNTER — Other Ambulatory Visit: Payer: Self-pay | Admitting: *Deleted

## 2019-02-07 MED ORDER — BLOOD GLUCOSE TEST VI STRP: ORAL_STRIP | 4 refills | Status: AC

## 2019-02-09 NOTE — Telephone Encounter (Signed)
LMTRC

## 2019-02-16 ENCOUNTER — Telehealth: Payer: Self-pay | Admitting: *Deleted

## 2019-02-16 ENCOUNTER — Other Ambulatory Visit: Payer: Self-pay

## 2019-02-16 ENCOUNTER — Other Ambulatory Visit: Payer: PPO

## 2019-02-16 DIAGNOSIS — Z794 Long term (current) use of insulin: Principal | ICD-10-CM

## 2019-02-16 DIAGNOSIS — E1100 Type 2 diabetes mellitus with hyperosmolarity without nonketotic hyperglycemic-hyperosmolar coma (NKHHC): Secondary | ICD-10-CM

## 2019-02-16 DIAGNOSIS — N289 Disorder of kidney and ureter, unspecified: Secondary | ICD-10-CM | POA: Diagnosis not present

## 2019-02-16 LAB — GLUCOSE 16585: Glucose: 195 mg/dL — ABNORMAL HIGH (ref 65–99)

## 2019-02-16 MED ORDER — GLIPIZIDE ER 10 MG PO TB24: 10.0000 mg | ORAL_TABLET | Freq: Every day | ORAL | 3 refills | Status: DC

## 2019-02-16 NOTE — Telephone Encounter (Signed)
Patient walked into office with c/o elevated CBG.   States that CBG noted at 300 on 02/15/2019 PM. States that he has not checked it this morning.  CBG obtained in office and noted at 195. States that he did eat breakfast at 8:30am- 3 eggs, toast, and coffee with milk.   States that he cannot afford either insulin or GLP-2.   Medication list updated to patient's current regimen.   VS noted as follows: BP 130/82 HR 78 RR 16 SpO2 98 T 97.9-- oral Wt 181 lb Ht 64"  MD made aware and NO given: begin Glipizide 10mg  ER and F/U with CBG readings in 3 weeks. Prescription sent to pharmacy. Appointment scheduled.   F/U labs obtained today.

## 2019-02-17 LAB — COMPREHENSIVE METABOLIC PANEL
AG Ratio: 1.6 (calc) (ref 1.0–2.5)
ALT: 10 U/L (ref 9–46)
AST: 11 U/L (ref 10–35)
Albumin: 4.1 g/dL (ref 3.6–5.1)
Alkaline phosphatase (APISO): 87 U/L (ref 35–144)
BUN/Creatinine Ratio: 17 (calc) (ref 6–22)
BUN: 28 mg/dL — ABNORMAL HIGH (ref 7–25)
CO2: 28 mmol/L (ref 20–32)
Calcium: 9.4 mg/dL (ref 8.6–10.3)
Chloride: 105 mmol/L (ref 98–110)
Creat: 1.69 mg/dL — ABNORMAL HIGH (ref 0.70–1.11)
Globulin: 2.5 g/dL (calc) (ref 1.9–3.7)
Glucose, Bld: 197 mg/dL — ABNORMAL HIGH (ref 65–99)
Potassium: 5.2 mmol/L (ref 3.5–5.3)
Sodium: 140 mmol/L (ref 135–146)
Total Bilirubin: 0.8 mg/dL (ref 0.2–1.2)
Total Protein: 6.6 g/dL (ref 6.1–8.1)

## 2019-02-17 LAB — HEMOGLOBIN A1C
Hgb A1c MFr Bld: 8.6 % of total Hgb — ABNORMAL HIGH (ref <5.7)
Mean Plasma Glucose: 200 (calc)
eAG (mmol/L): 11.1 (calc)

## 2019-03-07 ENCOUNTER — Ambulatory Visit: Payer: Self-pay | Admitting: Family Medicine

## 2019-05-10 ENCOUNTER — Other Ambulatory Visit: Payer: Self-pay | Admitting: Family Medicine

## 2019-05-15 ENCOUNTER — Other Ambulatory Visit: Payer: Self-pay

## 2019-05-15 DIAGNOSIS — I6523 Occlusion and stenosis of bilateral carotid arteries: Secondary | ICD-10-CM

## 2019-05-16 ENCOUNTER — Ambulatory Visit (INDEPENDENT_AMBULATORY_CARE_PROVIDER_SITE_OTHER): Payer: PPO | Admitting: Vascular Surgery

## 2019-05-16 ENCOUNTER — Encounter: Payer: Self-pay | Admitting: Vascular Surgery

## 2019-05-16 ENCOUNTER — Ambulatory Visit (HOSPITAL_COMMUNITY)
Admission: RE | Admit: 2019-05-16 | Discharge: 2019-05-16 | Disposition: A | Discharge status: Home / Self Care | Payer: PPO | Source: Ambulatory Visit | Attending: Vascular Surgery | Admitting: Vascular Surgery

## 2019-05-16 ENCOUNTER — Other Ambulatory Visit: Payer: Self-pay

## 2019-05-16 VITALS — BP 184/96 | HR 82 | Temp 97.9°F | Resp 20 | Ht 64.0 in | Wt 182.0 lb

## 2019-05-16 DIAGNOSIS — Z9889 Other specified postprocedural states: Secondary | ICD-10-CM | POA: Diagnosis not present

## 2019-05-16 DIAGNOSIS — I6523 Occlusion and stenosis of bilateral carotid arteries: Secondary | ICD-10-CM

## 2019-05-16 NOTE — Progress Notes (Signed)
[facility] [place]  Patient name: [name] MRN: [id number] DOB: May 17, 1931 Sex: male  REASON FOR VISIT: Here today for follow-up of carotid disease.  He is here today with his wife.  He remains quite active at his age of 89.  He reports that he was carrying some 90 pound bags of concrete and felt that he strained his right neck.  He does have a history of prior right carotid endarterectomy in 2008.  He had recurrence and underwent redo endarterectomy in 2013.  He has had a unusual finding of a apparent sinus track in his right incision that is been present since the second surgery in 2013.  He reports that this appeared to be aggravated with his recent lifting.  He specifically denies any neurologic deficits.  He has had no new neck issues.  He has no erythema around this area on his right neck.    Past Medical History:  Diagnosis Date  . Arthritis    knee  . Carotid artery occlusion   . Diabetes mellitus    metformin,januvia,and glipizide daily  . Enlarged prostate    pt states no trouble with it  . Hyperlipidemia    takes Simvastatin daily  . Hypertension    takes Atenolol and Lisinopril daily  . Impaired hearing, left    but doesn't wear hearing aids  . Seasonal allergies     Family History  Problem Relation Age of Onset  . Cancer Mother        Leukemia  . Leukemia Mother   . Asthma Father   . Anesthesia problems Neg Hx   . Hypotension Neg Hx   . Malignant hyperthermia Neg Hx   . Pseudochol deficiency Neg Hx     SOCIAL HISTORY: Social History   Tobacco Use  . Smoking status: Former Smoker    Years: 25.00    Types: Cigarettes    Quit date: 10/13/1991    Years since quitting: 27.6  . Smokeless tobacco: Never Used  Substance Use Topics  . Alcohol use: No    Comment: occassional glass of wine    No Known Allergies  Current Outpatient Medications  Medication Sig Dispense Refill  . aspirin EC 81 MG  tablet Take 81 mg by mouth daily.    [name] Kitchen atenolol (TENORMIN) 50 MG tablet TAKE 1 TABLET BY MOUTH DAILY 30 tablet 11  . furosemide (LASIX) 20 MG tablet TAKE 1 TABLET BY MOUTH EVERY DAY 90 tablet 3  . glipiZIDE (GLUCOTROL XL) 10 MG 24 hr tablet TAKE 1 TABLET BY MOUTH DAILY WITH BREAKFAST. 90 tablet 1  . Glucose Blood (BLOOD GLUCOSE TEST STRIPS) STRP Please dispense as One touch Ultra Blue. Use as directed to monitor FSBS 3x daily. Dx: E11.9. 100 each 4  . pioglitazone (ACTOS) 30 MG tablet TAKE 1 TABLET BY MOUTH EVERY DAY 90 tablet 3   No current facility-administered medications for this visit.     REVIEW OF SYSTEMS:  [X]  denotes positive finding, [ ]  denotes negative finding Cardiac  Comments:  Chest pain or chest pressure:    Shortness of breath upon exertion:    Short of breath when lying flat:    Irregular heart rhythm:        Vascular    Pain in calf, thigh, or hip brought on by ambulation:    Pain in feet at night that wakes you up from your sleep:     Blood clot in your veins:  Leg swelling:           PHYSICAL EXAM: Vitals:   05/16/19 0848 05/16/19 0852  BP: (!) 178/90 (!) 184/96  Pulse: 82   Resp: 20   Temp: 97.9 F (36.6 C)   TempSrc: Temporal   SpO2: 97%   Weight: 182 lb (82.6 kg)   Height: 5\' 4"  (1.626 m)     GENERAL: The patient is a well-nourished male, in no acute distress. The vital signs are documented above. CARDIOVASCULAR: Carotid arteries without bruits bilaterally.  Well-healed neck incision but does have a retracted area in the midportion of the incision.  No drainage from this.  No surrounding erythema PULMONARY: There is good air exchange  MUSCULOSKELETAL: There are no major deformities or cyanosis. NEUROLOGIC: No focal weakness or paresthesias are detected. SKIN: There are no ulcers or rashes noted. PSYCHIATRIC: The patient has a normal affect.  DATA:  Carotid duplex today reveals 40 to 59% stenosis in the right endarterectomy site and 60 to 79%  on the left.  MEDICAL ISSUES: I reviewed these findings with the patient and his wife present.  Recommend that we see him again in 1 year with repeat carotid duplex.  I explained that if he does have any drainage or erythema associated with his right neck incision he needs to let us know.  Explained that he does have a prosthetic patch over his endarterectomy site and would be certain about potential infection if he develops drainage or erythema.    Rosetta Posner, MD FACS [facility] Tel [phone] Pager [phone]

## 2019-06-30 ENCOUNTER — Other Ambulatory Visit: Payer: Self-pay | Admitting: Family Medicine

## 2019-11-12 ENCOUNTER — Ambulatory Visit: Payer: Self-pay

## 2019-11-17 ENCOUNTER — Ambulatory Visit: Payer: Self-pay

## 2019-11-17 ENCOUNTER — Other Ambulatory Visit: Payer: Self-pay | Admitting: Family Medicine

## 2019-11-18 ENCOUNTER — Ambulatory Visit: Payer: PPO | Attending: Internal Medicine

## 2019-11-18 DIAGNOSIS — Z23 Encounter for immunization: Secondary | ICD-10-CM

## 2019-11-18 NOTE — Progress Notes (Signed)
   Covid-19 Vaccination Clinic  Name:  [name]    MRN: [id number] DOB: 1930-10-18  11/18/2019  [name] was observed post Covid-19 immunization for 15 minutes without incidence. He was provided with Vaccine Information Sheet and instruction to access the V-Safe system.   [name] was instructed to call 911 with any severe reactions post vaccine: [name] Kitchen Difficulty breathing  . Swelling of your face and throat  . A fast heartbeat  . A bad rash all over your body  . Dizziness and weakness    Immunizations Administered    Name Date Dose VIS Date Route   Pfizer COVID-19 Vaccine 11/18/2019 11:02 AM 0.3 mL 09/22/2019 Intramuscular   Manufacturer: [place]   Lot: [id number]   [place]: [id number]

## 2019-12-12 ENCOUNTER — Ambulatory Visit: Payer: PPO | Attending: Internal Medicine

## 2019-12-12 DIAGNOSIS — Z23 Encounter for immunization: Secondary | ICD-10-CM | POA: Insufficient documentation

## 2019-12-12 NOTE — Progress Notes (Signed)
   Covid-19 Vaccination Clinic  Name:  [name]    MRN: [id number] DOB: 30-Apr-1931  12/12/2019  [name] was observed post Covid-19 immunization for 15 minutes without incident. He was provided with Vaccine Information Sheet and instruction to access the V-Safe system.   [name] was instructed to call 911 with any severe reactions post vaccine: [name] Kitchen Difficulty breathing  . Swelling of face and throat  . A fast heartbeat  . A bad rash all over body  . Dizziness and weakness   Immunizations Administered    Name Date Dose VIS Date Route   Pfizer COVID-19 Vaccine 12/12/2019 10:36 AM 0.3 mL 09/22/2019 Intramuscular   Manufacturer: [place]   Lot: [id number]   [place]: [id number]

## 2020-01-01 ENCOUNTER — Other Ambulatory Visit: Payer: Self-pay | Admitting: Family Medicine

## 2020-01-05 ENCOUNTER — Other Ambulatory Visit: Payer: Self-pay

## 2020-01-05 ENCOUNTER — Ambulatory Visit (INDEPENDENT_AMBULATORY_CARE_PROVIDER_SITE_OTHER): Payer: PPO | Admitting: Family Medicine

## 2020-01-05 ENCOUNTER — Encounter: Payer: Self-pay | Admitting: Family Medicine

## 2020-01-05 VITALS — BP 136/78 | HR 80 | Temp 97.6°F | Resp 14 | Wt 182.0 lb

## 2020-01-05 DIAGNOSIS — L988 Other specified disorders of the skin and subcutaneous tissue: Secondary | ICD-10-CM | POA: Diagnosis not present

## 2020-01-05 DIAGNOSIS — Z9889 Other specified postprocedural states: Secondary | ICD-10-CM

## 2020-01-05 DIAGNOSIS — E7800 Pure hypercholesterolemia, unspecified: Secondary | ICD-10-CM

## 2020-01-05 DIAGNOSIS — I2510 Atherosclerotic heart disease of native coronary artery without angina pectoris: Secondary | ICD-10-CM

## 2020-01-05 DIAGNOSIS — Z794 Long term (current) use of insulin: Secondary | ICD-10-CM | POA: Diagnosis not present

## 2020-01-05 DIAGNOSIS — E1100 Type 2 diabetes mellitus with hyperosmolarity without nonketotic hyperglycemic-hyperosmolar coma (NKHHC): Secondary | ICD-10-CM

## 2020-01-05 DIAGNOSIS — I10 Essential (primary) hypertension: Secondary | ICD-10-CM

## 2020-01-05 NOTE — Progress Notes (Signed)
Subjective:    Patient ID: [name], male    DOB: 1931-06-09, 86 y.o.   MRN: [id number]  I have not seen the patient since 2019.  He presents today with his wife to evaluate an area on the right side of his neck.  The patient had a carotid endarterectomy in the past.  At his last visit with Dr Donnetta Hutching, he mentions a sinus tract which has developed in the surgical scar of his right CEA.  Today on examination, the patient has a sinus tract in the middle of the scar of his previous right CEA.  There is some clear serous drainage coming from the opening.  There is no pus.  There is no pain.  There is no erythema.  There is no warmth.  Since I last saw the patient, he is clearly developed memory issues.  He is extremely forgetful and easily confused.  His wife has to provide the majority of the history.  Patient denies any chest pain shortness of breath or dyspnea on exertion.  His blood pressure today is well controlled at 136/78.  The patient has a history of medical noncompliance.  At present he is only taking pioglitazone and glipizide for his diabetes.  He is not checking his blood sugar although he denies any hypoglycemic episodes.  He is no longer apparently taking lisinopril.  He is taking atenolol.  We have not checked any lab work in quite some time. Past Medical History:  Diagnosis Date  . Arthritis    knee  . Carotid artery occlusion   . Diabetes mellitus    metformin,januvia,and glipizide daily  . Enlarged prostate    pt states no trouble with it  . Hyperlipidemia    takes Simvastatin daily  . Hypertension    takes Atenolol and Lisinopril daily  . Impaired hearing, left    but doesn't wear hearing aids  . Seasonal allergies    Past Surgical History:  Procedure Laterality Date  . APPENDECTOMY     as a child  . CAROTID ANGIOGRAM N/A 05/09/2013   Procedure: CAROTID ANGIOGRAM;  Surgeon: Serafina Mitchell, MD;  Location: [facility] CATH LAB;  Service: Cardiovascular;  Laterality:  N/A;  . CAROTID ENDARTERECTOMY Right 2008    Re-do Mar 04, 2012  . carotidendartectomy  2008   right side  . ENDARTERECTOMY  03/04/2012   Procedure: ENDARTERECTOMY CAROTID;  Surgeon: Rosetta Posner, MD;  Location: [facility] OR;  Service: Vascular;  Laterality: Right;  Redo Right Carotid Endarterectomy with hemasheild patch angioplasty   Current Outpatient Medications on File Prior to Visit  Medication Sig Dispense Refill  . aspirin EC 81 MG tablet Take 81 mg by mouth daily.    [name] Kitchen atenolol (TENORMIN) 50 MG tablet TAKE 1 TABLET BY MOUTH EVERY DAY 30 tablet 0  . furosemide (LASIX) 20 MG tablet TAKE 1 TABLET BY MOUTH EVERY DAY 90 tablet 3  . glipiZIDE (GLUCOTROL XL) 10 MG 24 hr tablet TAKE 1 TABLET BY MOUTH EVERY DAY WITH BREAKFAST 30 tablet 0  . Glucose Blood (BLOOD GLUCOSE TEST STRIPS) STRP Please dispense as One touch Ultra Blue. Use as directed to monitor FSBS 3x daily. Dx: E11.9. 100 each 4  . pioglitazone (ACTOS) 30 MG tablet TAKE 1 TABLET BY MOUTH EVERY DAY 90 tablet 3   No current facility-administered medications on file prior to visit.   No Known Allergies Social History   Socioeconomic History  . Marital status: Married    Spouse name:  Not on file  . Number of children: Not on file  . Years of education: Not on file  . Highest education level: Not on file  Occupational History  . Not on file  Tobacco Use  . Smoking status: Former Smoker    Years: 25.00    Types: Cigarettes    Quit date: 10/13/1991    Years since quitting: 28.2  . Smokeless tobacco: Never Used  Substance and Sexual Activity  . Alcohol use: No    Comment: occassional glass of wine  . Drug use: No  . Sexual activity: Yes  Other Topics Concern  . Not on file  Social History Narrative  . Not on file   Social Determinants of Health   Financial Resource Strain:   . Difficulty of Paying Living Expenses:   Food Insecurity:   . Worried About Charity fundraiser in the Last Year:   . Arboriculturist in the Last  Year:   Transportation Needs:   . Film/video editor (Medical):   [name] Kitchen Lack of Transportation (Non-Medical):   Physical Activity:   . Days of Exercise per Week:   . Minutes of Exercise per Session:   Stress:   . Feeling of Stress :   Social Connections:   . Frequency of Communication with Friends and Family:   . Frequency of Social Gatherings with Friends and Family:   . Attends Religious Services:   . Active Member of Clubs or Organizations:   . Attends Archivist Meetings:   [name] Kitchen Marital Status:   Intimate Partner Violence:   . Fear of Current or Ex-Partner:   . Emotionally Abused:   [name] Kitchen Physically Abused:   . Sexually Abused:       Review of Systems  All other systems reviewed and are negative.      Objective:   Physical Exam  Constitutional: He appears well-developed and well-nourished. No distress.  Neck: No JVD present. No thyromegaly present.  Cardiovascular: Normal rate, regular rhythm, normal heart sounds and intact distal pulses. Exam reveals no gallop and no friction rub.  No murmur heard. Pulmonary/Chest: Effort normal and breath sounds normal. No respiratory distress. He has no wheezes. He has no rales.  Abdominal: Soft. Bowel sounds are normal. He exhibits no distension and no mass. There is no abdominal tenderness. There is no rebound and no guarding.  Musculoskeletal:        General: No edema.     Cervical back: Neck supple.  Lymphadenopathy:    He has no cervical adenopathy.  Skin: He is not diaphoretic.  Vitals reviewed.         Assessment & Plan:  Uncontrolled type 2 diabetes mellitus with hyperosmolarity without coma, with long-term current use of insulin (HCC) - Plan: Hemoglobin A1c, CBC with Differential/Platelet, COMPLETE METABOLIC PANEL WITH GFR, Lipid panel  Draining cutaneous sinus tract  Essential hypertension  ASCVD (arteriosclerotic cardiovascular disease)  Pure hypercholesterolemia  History of CEA (carotid  endarterectomy)  Given his age, his dementia, his uncontrolled diabetes, and his medical noncompliance, I believe that he would be a high surgical risk to correct the draining sinus tract.  Therefore I would only recommend surgery to correct it if it will recurrently infected or causing him pain.  At the present time there is no apparent infection and he denies any pain other than occasional drainage.  Therefore I recommended that they keep the area clean and dry.  I recommended they clean it gently daily  with a Q-tip with hydrogen peroxide.  I recommended they cover the area with Polysporin and a Band-Aid.  Monitor for any evidence of secondary cellulitis.  Check a CBC, CMP, fasting lipid panel, and hemoglobin A1c.  I would be extremely happy with this patient if his A1c is between 7 and 8.  I believe he would be extremely risky to try to obtain tight glycemic control due to his history of noncompliance.  Monitor his renal function.  Goal LDL cholesterol is less than 70.

## 2020-01-06 LAB — CBC WITH DIFFERENTIAL/PLATELET
Absolute Monocytes: 851 cells/uL (ref 200–950)
Basophils Absolute: 38 cells/uL (ref 0–200)
Basophils Relative: 0.6 %
Eosinophils Absolute: 128 cells/uL (ref 15–500)
Eosinophils Relative: 2 %
HCT: 46.5 % (ref 38.5–50.0)
Hemoglobin: 15.2 g/dL (ref 13.2–17.1)
Lymphs Abs: 2163 cells/uL (ref 850–3900)
MCH: 31 pg (ref 27.0–33.0)
MCHC: 32.7 g/dL (ref 32.0–36.0)
MCV: 94.9 fL (ref 80.0–100.0)
MPV: 10.1 fL (ref 7.5–12.5)
Monocytes Relative: 13.3 %
Neutro Abs: 3219 cells/uL (ref 1500–7800)
Neutrophils Relative %: 50.3 %
Platelets: 173 10*3/uL (ref 140–400)
RBC: 4.9 10*6/uL (ref 4.20–5.80)
RDW: 12.8 % (ref 11.0–15.0)
Total Lymphocyte: 33.8 %
WBC: 6.4 10*3/uL (ref 3.8–10.8)

## 2020-01-06 LAB — COMPLETE METABOLIC PANEL WITH GFR
AG Ratio: 1.6 (calc) (ref 1.0–2.5)
ALT: 9 U/L (ref 9–46)
AST: 10 U/L (ref 10–35)
Albumin: 3.9 g/dL (ref 3.6–5.1)
Alkaline phosphatase (APISO): 92 U/L (ref 35–144)
BUN/Creatinine Ratio: 16 (calc) (ref 6–22)
BUN: 27 mg/dL — ABNORMAL HIGH (ref 7–25)
CO2: 22 mmol/L (ref 20–32)
Calcium: 9.4 mg/dL (ref 8.6–10.3)
Chloride: 101 mmol/L (ref 98–110)
Creat: 1.74 mg/dL — ABNORMAL HIGH (ref 0.70–1.11)
GFR, Est African American: 40 mL/min/{1.73_m2} — ABNORMAL LOW (ref >=60)
GFR, Est Non African American: 35 mL/min/{1.73_m2} — ABNORMAL LOW (ref >=60)
Globulin: 2.5 g/dL (calc) (ref 1.9–3.7)
Glucose, Bld: 291 mg/dL — ABNORMAL HIGH (ref 65–99)
Potassium: 4.6 mmol/L (ref 3.5–5.3)
Sodium: 136 mmol/L (ref 135–146)
Total Bilirubin: 0.9 mg/dL (ref 0.2–1.2)
Total Protein: 6.4 g/dL (ref 6.1–8.1)

## 2020-01-06 LAB — LIPID PANEL
Cholesterol: 174 mg/dL (ref <200)
HDL: 33 mg/dL — ABNORMAL LOW (ref >=40)
LDL Cholesterol (Calc): 104 mg/dL (calc) — ABNORMAL HIGH
Non-HDL Cholesterol (Calc): 141 mg/dL (calc) — ABNORMAL HIGH (ref <130)
Total CHOL/HDL Ratio: 5.3 (calc) — ABNORMAL HIGH (ref <5.0)
Triglycerides: 261 mg/dL — ABNORMAL HIGH (ref <150)

## 2020-01-06 LAB — HEMOGLOBIN A1C
Hgb A1c MFr Bld: 10.8 % of total Hgb — ABNORMAL HIGH (ref <5.7)
Mean Plasma Glucose: 263 (calc)
eAG (mmol/L): 14.6 (calc)

## 2020-01-10 ENCOUNTER — Other Ambulatory Visit: Payer: Self-pay | Admitting: Family Medicine

## 2020-01-10 MED ORDER — TRULICITY 0.75 MG/0.5ML ~~LOC~~ SOAJ: 0.7500 mg | SUBCUTANEOUS | 0 refills | Status: DC

## 2020-01-26 ENCOUNTER — Other Ambulatory Visit: Payer: Self-pay | Admitting: Family Medicine

## 2020-02-01 ENCOUNTER — Other Ambulatory Visit: Payer: Self-pay | Admitting: Family Medicine

## 2020-02-28 ENCOUNTER — Other Ambulatory Visit: Payer: Self-pay | Admitting: Family Medicine

## 2020-04-22 ENCOUNTER — Telehealth: Payer: Self-pay | Admitting: Family Medicine

## 2020-04-22 NOTE — Progress Notes (Signed)
  Chronic Care Management   Outreach Note  04/22/2020 Name: [name] MRN: [id number] DOB: [birth year]  Referred by: Susy Frizzle, MD Reason for referral : No chief complaint on file.   An unsuccessful telephone outreach was attempted today. The patient was referred to the pharmacist for assistance with care management and care coordination.   Follow Up Plan:   [name]

## 2020-05-15 ENCOUNTER — Telehealth: Payer: Self-pay | Admitting: Family Medicine

## 2020-05-15 NOTE — Progress Notes (Signed)
°  Chronic Care Management   Outreach Note  05/15/2020 Name: [name] MRN: [id number] DOB: [birth year]  Referred by: Susy Frizzle, MD Reason for referral : No chief complaint on file.   A second unsuccessful telephone outreach was attempted today. The patient was referred to pharmacist for assistance with care management and care coordination.  Follow Up Plan:   Carley Perdue [employer] Scheduler

## 2020-05-17 ENCOUNTER — Other Ambulatory Visit: Payer: Self-pay

## 2020-05-17 DIAGNOSIS — I6523 Occlusion and stenosis of bilateral carotid arteries: Secondary | ICD-10-CM

## 2020-05-22 ENCOUNTER — Telehealth: Payer: Self-pay | Admitting: Family Medicine

## 2020-05-22 NOTE — Progress Notes (Signed)
  Chronic Care Management   Outreach Note  05/22/2020 Name: [name] MRN: [id number] DOB: [birth year]  Referred by: Susy Frizzle, MD Reason for referral : No chief complaint on file.   Third unsuccessful telephone outreach was attempted today. The patient was referred to the pharmacist for assistance with care management and care coordination.   Follow Up Plan:   Carley Perdue [employer] Scheduler

## 2020-05-30 ENCOUNTER — Other Ambulatory Visit: Payer: Self-pay | Admitting: Family Medicine

## 2020-05-31 ENCOUNTER — Other Ambulatory Visit: Payer: Self-pay

## 2020-05-31 MED ORDER — ATENOLOL 50 MG PO TABS: 50.0000 mg | ORAL_TABLET | Freq: Every day | ORAL | 5 refills | Status: DC

## 2020-06-04 ENCOUNTER — Other Ambulatory Visit: Payer: Self-pay

## 2020-06-04 ENCOUNTER — Encounter: Payer: Self-pay | Admitting: Vascular Surgery

## 2020-06-04 ENCOUNTER — Ambulatory Visit (HOSPITAL_COMMUNITY)
Admission: RE | Admit: 2020-06-04 | Discharge: 2020-06-04 | Disposition: A | Discharge status: Home / Self Care | Payer: PPO | Source: Ambulatory Visit | Attending: Vascular Surgery | Admitting: Vascular Surgery

## 2020-06-04 ENCOUNTER — Ambulatory Visit (INDEPENDENT_AMBULATORY_CARE_PROVIDER_SITE_OTHER): Payer: PPO | Admitting: Family Medicine

## 2020-06-04 ENCOUNTER — Other Ambulatory Visit: Payer: Self-pay | Admitting: Family Medicine

## 2020-06-04 ENCOUNTER — Ambulatory Visit (INDEPENDENT_AMBULATORY_CARE_PROVIDER_SITE_OTHER): Payer: PPO | Admitting: Vascular Surgery

## 2020-06-04 VITALS — BP 152/91 | HR 87 | Temp 97.4°F | Resp 20 | Ht 64.0 in | Wt 176.0 lb

## 2020-06-04 VITALS — BP 110/70 | HR 91 | Temp 97.7°F | Ht 64.0 in | Wt 176.0 lb

## 2020-06-04 DIAGNOSIS — Z9889 Other specified postprocedural states: Secondary | ICD-10-CM | POA: Diagnosis not present

## 2020-06-04 DIAGNOSIS — E1165 Type 2 diabetes mellitus with hyperglycemia: Secondary | ICD-10-CM

## 2020-06-04 DIAGNOSIS — I6523 Occlusion and stenosis of bilateral carotid arteries: Secondary | ICD-10-CM

## 2020-06-04 MED ORDER — GLIPIZIDE ER 10 MG PO TB24: 10.0000 mg | ORAL_TABLET | Freq: Every day | ORAL | 3 refills | Status: DC

## 2020-06-04 NOTE — Progress Notes (Signed)
[facility] [place]  Patient name: [name] MRN: [id number] DOB: 30-Sep-1931 Sex: male  REASON FOR VISIT: Follow-up carotid disease  HPI: [name] is a 86 y.o. male here today for follow-up.  He is here with his grandson and I am also speaking with his son-in-law by telephone.  He had undergone a prior right carotid endarterectomy in 2008.  He had recurrent stenosis and underwent redo endarterectomy in 2013.  His main complaint is of lack of energy and feeling as though he sleeps a great deal of the time.  His family are concerned about this as well.  He has no focal deficits.  Past Medical History:  Diagnosis Date  . Arthritis    knee  . Carotid artery occlusion   . Diabetes mellitus    metformin,januvia,and glipizide daily  . Enlarged prostate    pt states no trouble with it  . Hyperlipidemia    takes Simvastatin daily  . Hypertension    takes Atenolol and Lisinopril daily  . Impaired hearing, left    but doesn't wear hearing aids  . Seasonal allergies     Family History  Problem Relation Age of Onset  . Cancer Mother        Leukemia  . Leukemia Mother   . Asthma Father   . Anesthesia problems Neg Hx   . Hypotension Neg Hx   . Malignant hyperthermia Neg Hx   . Pseudochol deficiency Neg Hx     SOCIAL HISTORY: Social History   Tobacco Use  . Smoking status: Former Smoker    Years: 25.00    Types: Cigarettes    Quit date: 10/13/1991    Years since quitting: 28.6  . Smokeless tobacco: Never Used  Substance Use Topics  . Alcohol use: No    Comment: occassional glass of wine    No Known Allergies  Current Outpatient Medications  Medication Sig Dispense Refill  . aspirin EC 81 MG tablet Take 81 mg by mouth daily.    [name] Kitchen atenolol (TENORMIN) 50 MG tablet Take 1 tablet (50 mg total) by mouth daily. 30 tablet 5  . furosemide (LASIX) 20 MG tablet TAKE 1 TABLET BY MOUTH EVERY DAY 90 tablet  3  . glipiZIDE (GLUCOTROL XL) 10 MG 24 hr tablet TAKE 1 TABLET BY MOUTH EVERY DAY WITH BREAKFAST 30 tablet 0  . Glucose Blood (BLOOD GLUCOSE TEST STRIPS) STRP Please dispense as One touch Ultra Blue. Use as directed to monitor FSBS 3x daily. Dx: E11.9. 100 each 4  . pioglitazone (ACTOS) 30 MG tablet TAKE 1 TABLET BY MOUTH EVERY DAY 90 tablet 0  . TRULICITY 6.62 HU/7.6LY SOPN INJECT 0.75 MG INTO THE SKIN ONCE A WEEK. 2 mL 11   No current facility-administered medications for this visit.    REVIEW OF SYSTEMS:  [X]  denotes positive finding, [ ]  denotes negative finding Cardiac  Comments:  Chest pain or chest pressure:    Shortness of breath upon exertion:    Short of breath when lying flat:    Irregular heart rhythm:        Vascular    Pain in calf, thigh, or hip brought on by ambulation:    Pain in feet at night that wakes you up from your sleep:     Blood clot in your veins:    Leg swelling:           PHYSICAL EXAM: Vitals:   06/04/20 6503 06/04/20 0941  BP: (!) 147/83 (!) 152/91  Pulse: 87   Resp: 20   Temp: (!) 97.4 F (36.3 C)   SpO2: 93%   Weight: 176 lb (79.8 kg)   Height: 5\' 4"  (1.626 m)     GENERAL: The patient is a well-nourished male, in no acute distress. The vital signs are documented above. CARDIOVASCULAR: His right carotid incision has a draining sinus in the lower aspect of the incision.  He reports that this is less inflamed that it has been at times.  This is certainly much more extensive than when I saw him last 1 year ago. PULMONARY: There is good air exchange  MUSCULOSKELETAL: There are no major deformities or cyanosis. NEUROLOGIC: No focal weakness or paresthesias are detected. SKIN: There are no ulcers or rashes noted. PSYCHIATRIC: The patient has a normal affect.  DATA:  Carotid duplex reveals widely patent endarterectomy on the right.  MEDICAL ISSUES: I am concerned regarding the persistent sinus.  This has been present for a number of years but  appears to be getting more progressively enlarged and his grandson reports an occasional foul smell as well.  I have recommended that we do a formal exploration in the operating room.  Explained that that it is quite possible that this extends down to his Dacron prosthetic patch and if so the patch would have to be removed and replaced with vein patch.  They will speak with Dr. Dennard Schaumann regarding his seeming lack of energy.They certainly understand this may be related to his age of 65.    Rosetta Posner, MD FACS [facility] Tel [phone] Pager [phone]

## 2020-06-04 NOTE — H&P (View-Only) (Signed)
[facility] [place]  Patient name: [name] MRN: [id number] DOB: Dec 12, 1930 Sex: male  REASON FOR VISIT: Follow-up carotid disease  HPI: [name] is a 86 y.o. male here today for follow-up.  He is here with his grandson and I am also speaking with his son-in-law by telephone.  He had undergone a prior right carotid endarterectomy in 2008.  He had recurrent stenosis and underwent redo endarterectomy in 2013.  His main complaint is of lack of energy and feeling as though he sleeps a great deal of the time.  His family are concerned about this as well.  He has no focal deficits.  Past Medical History:  Diagnosis Date  . Arthritis    knee  . Carotid artery occlusion   . Diabetes mellitus    metformin,januvia,and glipizide daily  . Enlarged prostate    pt states no trouble with it  . Hyperlipidemia    takes Simvastatin daily  . Hypertension    takes Atenolol and Lisinopril daily  . Impaired hearing, left    but doesn't wear hearing aids  . Seasonal allergies     Family History  Problem Relation Age of Onset  . Cancer Mother        Leukemia  . Leukemia Mother   . Asthma Father   . Anesthesia problems Neg Hx   . Hypotension Neg Hx   . Malignant hyperthermia Neg Hx   . Pseudochol deficiency Neg Hx     SOCIAL HISTORY: Social History   Tobacco Use  . Smoking status: Former Smoker    Years: 25.00    Types: Cigarettes    Quit date: 10/13/1991    Years since quitting: 28.6  . Smokeless tobacco: Never Used  Substance Use Topics  . Alcohol use: No    Comment: occassional glass of wine    No Known Allergies  Current Outpatient Medications  Medication Sig Dispense Refill  . aspirin EC 81 MG tablet Take 81 mg by mouth daily.    [name] Kitchen atenolol (TENORMIN) 50 MG tablet Take 1 tablet (50 mg total) by mouth daily. 30 tablet 5  . furosemide (LASIX) 20 MG tablet TAKE 1 TABLET BY MOUTH EVERY DAY 90 tablet  3  . glipiZIDE (GLUCOTROL XL) 10 MG 24 hr tablet TAKE 1 TABLET BY MOUTH EVERY DAY WITH BREAKFAST 30 tablet 0  . Glucose Blood (BLOOD GLUCOSE TEST STRIPS) STRP Please dispense as One touch Ultra Blue. Use as directed to monitor FSBS 3x daily. Dx: E11.9. 100 each 4  . pioglitazone (ACTOS) 30 MG tablet TAKE 1 TABLET BY MOUTH EVERY DAY 90 tablet 0  . TRULICITY 9.56 OZ/3.0QM SOPN INJECT 0.75 MG INTO THE SKIN ONCE A WEEK. 2 mL 11   No current facility-administered medications for this visit.    REVIEW OF SYSTEMS:  [X]  denotes positive finding, [ ]  denotes negative finding Cardiac  Comments:  Chest pain or chest pressure:    Shortness of breath upon exertion:    Short of breath when lying flat:    Irregular heart rhythm:        Vascular    Pain in calf, thigh, or hip brought on by ambulation:    Pain in feet at night that wakes you up from your sleep:     Blood clot in your veins:    Leg swelling:           PHYSICAL EXAM: Vitals:   06/04/20 5784 06/04/20 0941  BP: (!) 147/83 (!) 152/91  Pulse: 87   Resp: 20   Temp: (!) 97.4 F (36.3 C)   SpO2: 93%   Weight: 176 lb (79.8 kg)   Height: 5\' 4"  (1.626 m)     GENERAL: The patient is a well-nourished male, in no acute distress. The vital signs are documented above. CARDIOVASCULAR: His right carotid incision has a draining sinus in the lower aspect of the incision.  He reports that this is less inflamed that it has been at times.  This is certainly much more extensive than when I saw him last 1 year ago. PULMONARY: There is good air exchange  MUSCULOSKELETAL: There are no major deformities or cyanosis. NEUROLOGIC: No focal weakness or paresthesias are detected. SKIN: There are no ulcers or rashes noted. PSYCHIATRIC: The patient has a normal affect.  DATA:  Carotid duplex reveals widely patent endarterectomy on the right.  MEDICAL ISSUES: I am concerned regarding the persistent sinus.  This has been present for a number of years but  appears to be getting more progressively enlarged and his grandson reports an occasional foul smell as well.  I have recommended that we do a formal exploration in the operating room.  Explained that that it is quite possible that this extends down to his Dacron prosthetic patch and if so the patch would have to be removed and replaced with vein patch.  They will speak with Dr. Dennard Schaumann regarding his seeming lack of energy.They certainly understand this may be related to his age of 57.    Rosetta Posner, MD FACS [facility] Tel [phone] Pager [phone]

## 2020-06-04 NOTE — Progress Notes (Signed)
Subjective:    Patient ID: [name], male    DOB: [birth year], 86 y.o.   MRN: [id number]  Patient is an 86 year old white male with a history of dementia, chronic kidney disease stage IV, and poorly controlled diabetes mellitus.  I saw him in March.  At that time on Actos and glipizide his A1c was 10.8.  I recommended adding Trulicity.  However at some point he stopped taking his glipizide.  He is only been on Actos and Trulicity.  He presents today with his grandson.  His blood sugars recently have been 304 100.  He reports a dry mouth and fatigue.  He denies any worsening confusion or fevers or chills or nausea or vomiting or diarrhea Past Medical History:  Diagnosis Date  . Arthritis    knee  . Carotid artery occlusion   . Diabetes mellitus    metformin,januvia,and glipizide daily  . Enlarged prostate    pt states no trouble with it  . Hyperlipidemia    takes Simvastatin daily  . Hypertension    takes Atenolol and Lisinopril daily  . Impaired hearing, left    but doesn't wear hearing aids  . Seasonal allergies    Past Surgical History:  Procedure Laterality Date  . APPENDECTOMY     as a child  . CAROTID ANGIOGRAM N/A 05/09/2013   Procedure: CAROTID ANGIOGRAM;  Surgeon: Serafina Mitchell, MD;  Location: [facility] CATH LAB;  Service: Cardiovascular;  Laterality: N/A;  . CAROTID ENDARTERECTOMY Right 2008    Re-do Mar 04, 2012  . carotidendartectomy  2008   right side  . ENDARTERECTOMY  03/04/2012   Procedure: ENDARTERECTOMY CAROTID;  Surgeon: Rosetta Posner, MD;  Location: [facility] OR;  Service: Vascular;  Laterality: Right;  Redo Right Carotid Endarterectomy with hemasheild patch angioplasty   Current Outpatient Medications on File Prior to Visit  Medication Sig Dispense Refill  . aspirin EC 81 MG tablet Take 81 mg by mouth daily.    [name] Kitchen atenolol (TENORMIN) 50 MG tablet Take 1 tablet (50 mg total) by mouth daily. 30 tablet 5  . furosemide (LASIX) 20 MG tablet TAKE 1 TABLET BY MOUTH  EVERY DAY 90 tablet 3  . Glucose Blood (BLOOD GLUCOSE TEST STRIPS) STRP Please dispense as One touch Ultra Blue. Use as directed to monitor FSBS 3x daily. Dx: E11.9. 100 each 4  . pioglitazone (ACTOS) 30 MG tablet TAKE 1 TABLET BY MOUTH EVERY DAY 90 tablet 0  . TRULICITY 7.98 XQ/1.1HE SOPN INJECT 0.75 MG INTO THE SKIN ONCE A WEEK. 2 mL 11  . glipiZIDE (GLUCOTROL XL) 10 MG 24 hr tablet TAKE 1 TABLET BY MOUTH EVERY DAY WITH BREAKFAST (Patient not taking: Reported on 06/04/2020) 30 tablet 0   No current facility-administered medications on file prior to visit.   No Known Allergies Social History   Socioeconomic History  . Marital status: Married    Spouse name: Not on file  . Number of children: Not on file  . Years of education: Not on file  . Highest education level: Not on file  Occupational History  . Not on file  Tobacco Use  . Smoking status: Former Smoker    Years: 25.00    Types: Cigarettes    Quit date: 10/13/1991    Years since quitting: 28.6  . Smokeless tobacco: Never Used  Vaping Use  . Vaping Use: Never used  Substance and Sexual Activity  . Alcohol use: No    Comment: occassional glass of  wine  . Drug use: No  . Sexual activity: Yes  Other Topics Concern  . Not on file  Social History Narrative  . Not on file   Social Determinants of Health   Financial Resource Strain:   . Difficulty of Paying Living Expenses: Not on file  Food Insecurity:   . Worried About Charity fundraiser in the Last Year: Not on file  . Ran Out of Food in the Last Year: Not on file  Transportation Needs:   . Lack of Transportation (Medical): Not on file  . Lack of Transportation (Non-Medical): Not on file  Physical Activity:   . Days of Exercise per Week: Not on file  . Minutes of Exercise per Session: Not on file  Stress:   . Feeling of Stress : Not on file  Social Connections:   . Frequency of Communication with Friends and Family: Not on file  . Frequency of Social Gatherings  with Friends and Family: Not on file  . Attends Religious Services: Not on file  . Active Member of Clubs or Organizations: Not on file  . Attends Archivist Meetings: Not on file  . Marital Status: Not on file  Intimate Partner Violence:   . Fear of Current or Ex-Partner: Not on file  . Emotionally Abused: Not on file  . Physically Abused: Not on file  . Sexually Abused: Not on file      Review of Systems  All other systems reviewed and are negative.      Objective:   Physical Exam Vitals reviewed.  Constitutional:      General: He is not in acute distress.    Appearance: He is well-developed. He is not diaphoretic.  Neck:     Thyroid: No thyromegaly.     Vascular: No JVD.  Cardiovascular:     Rate and Rhythm: Normal rate and regular rhythm.     Heart sounds: Normal heart sounds. No murmur heard.  No friction rub. No gallop.   Pulmonary:     Effort: Pulmonary effort is normal. No respiratory distress.     Breath sounds: Normal breath sounds. No wheezing or rales.  Abdominal:     General: Bowel sounds are normal. There is no distension.     Palpations: Abdomen is soft. There is no mass.     Tenderness: There is no abdominal tenderness. There is no guarding or rebound.  Musculoskeletal:     Cervical back: Neck supple.  Lymphadenopathy:     Cervical: No cervical adenopathy.           Assessment & Plan:  Type 2 diabetes mellitus with hyperglycemia, without long-term current use of insulin (HCC) - Plan: COMPLETE METABOLIC PANEL WITH GFR, CBC with Differential/Platelet, Hemoglobin A1c  Resume glipizide 10 mg daily immediately.  Check CMP, CBC, and A1c.  If A1c is over 14 he will have to use insulin.  Patient is a poor insulin candidate due to his dementia, confusion, and due to the fact he seldom checks his blood sugars.  Therefore the family would have to assume control of his diabetes.  I would be willing to accept blood sugars around 200 at his age to  prevent complications.

## 2020-06-05 LAB — CBC WITH DIFFERENTIAL/PLATELET
Absolute Monocytes: 663 cells/uL (ref 200–950)
Basophils Absolute: 31 cells/uL (ref 0–200)
Basophils Relative: 0.5 %
Eosinophils Absolute: 62 cells/uL (ref 15–500)
Eosinophils Relative: 1 %
HCT: 46.7 % (ref 38.5–50.0)
Hemoglobin: 15.1 g/dL (ref 13.2–17.1)
Lymphs Abs: 2337 cells/uL (ref 850–3900)
MCH: 30.7 pg (ref 27.0–33.0)
MCHC: 32.3 g/dL (ref 32.0–36.0)
MCV: 94.9 fL (ref 80.0–100.0)
MPV: 10.4 fL (ref 7.5–12.5)
Monocytes Relative: 10.7 %
Neutro Abs: 3106 cells/uL (ref 1500–7800)
Neutrophils Relative %: 50.1 %
Platelets: 182 10*3/uL (ref 140–400)
RBC: 4.92 10*6/uL (ref 4.20–5.80)
RDW: 12.9 % (ref 11.0–15.0)
Total Lymphocyte: 37.7 %
WBC: 6.2 10*3/uL (ref 3.8–10.8)

## 2020-06-05 LAB — COMPLETE METABOLIC PANEL WITH GFR
AG Ratio: 1.5 (calc) (ref 1.0–2.5)
ALT: 12 U/L (ref 9–46)
AST: 13 U/L (ref 10–35)
Albumin: 4.1 g/dL (ref 3.6–5.1)
Alkaline phosphatase (APISO): 75 U/L (ref 35–144)
BUN/Creatinine Ratio: 19 (calc) (ref 6–22)
BUN: 34 mg/dL — ABNORMAL HIGH (ref 7–25)
CO2: 26 mmol/L (ref 20–32)
Calcium: 9.1 mg/dL (ref 8.6–10.3)
Chloride: 96 mmol/L — ABNORMAL LOW (ref 98–110)
Creat: 1.81 mg/dL — ABNORMAL HIGH (ref 0.70–1.11)
GFR, Est African American: 38 mL/min/{1.73_m2} — ABNORMAL LOW (ref >=60)
GFR, Est Non African American: 33 mL/min/{1.73_m2} — ABNORMAL LOW (ref >=60)
Globulin: 2.7 g/dL (calc) (ref 1.9–3.7)
Glucose, Bld: 330 mg/dL — ABNORMAL HIGH (ref 65–99)
Potassium: 4.4 mmol/L (ref 3.5–5.3)
Sodium: 133 mmol/L — ABNORMAL LOW (ref 135–146)
Total Bilirubin: 1.2 mg/dL (ref 0.2–1.2)
Total Protein: 6.8 g/dL (ref 6.1–8.1)

## 2020-06-05 LAB — HEMOGLOBIN A1C
Hgb A1c MFr Bld: 10.6 % of total Hgb — ABNORMAL HIGH (ref <5.7)
Mean Plasma Glucose: 258 (calc)
eAG (mmol/L): 14.3 (calc)

## 2020-06-06 ENCOUNTER — Ambulatory Visit: Payer: Self-pay | Admitting: Family Medicine

## 2020-06-10 ENCOUNTER — Other Ambulatory Visit: Payer: Self-pay | Admitting: Family Medicine

## 2020-06-13 ENCOUNTER — Ambulatory Visit (INDEPENDENT_AMBULATORY_CARE_PROVIDER_SITE_OTHER): Payer: PPO | Admitting: Family Medicine

## 2020-06-13 ENCOUNTER — Other Ambulatory Visit: Payer: Self-pay

## 2020-06-13 VITALS — BP 130/70 | HR 76 | Temp 97.3°F | Ht 64.0 in | Wt 176.0 lb

## 2020-06-13 DIAGNOSIS — Z794 Long term (current) use of insulin: Secondary | ICD-10-CM

## 2020-06-13 DIAGNOSIS — E1100 Type 2 diabetes mellitus with hyperosmolarity without nonketotic hyperglycemic-hyperosmolar coma (NKHHC): Secondary | ICD-10-CM

## 2020-06-13 NOTE — Progress Notes (Signed)
Subjective:    Patient ID: [name], male    DOB: 13-Jul-1931, 86 y.o.   MRN: [id number] 06/04/20 Patient is an 86 year old white male with a history of dementia, chronic kidney disease stage IV, and poorly controlled diabetes mellitus.  I saw him in March.  At that time on Actos and glipizide his A1c was 10.8.  I recommended adding Trulicity.  However at some point he stopped taking his glipizide.  He is only been on Actos and Trulicity.  He presents today with his grandson.  His blood sugars recently have been 300-400.  He reports a dry mouth and fatigue.  He denies any worsening confusion or fevers or chills or nausea or vomiting or diarrhea.  AT that time, my plan was: Resume glipizide 10 mg daily immediately.  Check CMP, CBC, and A1c.  If A1c is over 14 he will have to use insulin.  Patient is a poor insulin candidate due to his dementia, confusion, and due to the fact he seldom checks his blood sugars.  Therefore the family would have to assume control of his diabetes.  I would be willing to accept blood sugars around 200 at his age to prevent complications.  06/13/20 At his last visit, his hemoglobin A1c was 10.6.  Therefore I recommended that he resume glipizide however I wanted to see him to discuss Lantus.  Patient is here today by himself.  He is very hard of hearing.  He has not started Lantus but he is taking glipizide.  He states his fasting blood sugar this morning was 147.  His fasting blood sugars over the last week have been around 150-160.  They continue to improve.  He denies any blood sugars over 200.  He has not been taking his sugars later in the day.  He denies any hypoglycemic episodes.  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 Past Medical History:  Diagnosis Date  . Arthritis    knee  . Carotid artery occlusion   . Diabetes mellitus    metformin,januvia,and glipizide daily  . Enlarged prostate    pt  states no trouble with it  . Hyperlipidemia    takes Simvastatin daily  . Hypertension    takes Atenolol and Lisinopril daily  . Impaired hearing, left    but doesn't wear hearing aids  . Seasonal allergies    Past Surgical History:  Procedure Laterality Date  . APPENDECTOMY     as a child  . CAROTID ANGIOGRAM N/A 05/09/2013   Procedure: CAROTID ANGIOGRAM;  Surgeon: Serafina Mitchell, MD;  Location: [facility] CATH LAB;  Service: Cardiovascular;  Laterality: N/A;  . CAROTID ENDARTERECTOMY Right 2008    Re-do Mar 04, 2012  . carotidendartectomy  2008   right side  . ENDARTERECTOMY  03/04/2012   Procedure: ENDARTERECTOMY CAROTID;  Surgeon: Rosetta Posner, MD;  Location: [facility] OR;  Service: Vascular;  Laterality: Right;  Redo Right Carotid Endarterectomy with hemasheild patch angioplasty   Current Outpatient Medications on File Prior to Visit  Medication Sig Dispense Refill  . aspirin EC 81 MG tablet Take 81 mg by mouth daily.    [name] Kitchen atenolol (TENORMIN) 50 MG tablet Take 1 tablet (50 mg total) by mouth daily. 30 tablet 5  . furosemide (LASIX) 20 MG tablet TAKE 1 TABLET BY MOUTH EVERY DAY (Patient taking differently: Take 20 mg by mouth daily. ) 90 tablet 3  .  glipiZIDE (GLUCOTROL XL) 10 MG 24 hr tablet Take 1 tablet (10 mg total) by mouth daily with breakfast. 90 tablet 3  . Glucose Blood (BLOOD GLUCOSE TEST STRIPS) STRP Please dispense as One touch Ultra Blue. Use as directed to monitor FSBS 3x daily. Dx: E11.9. 100 each 4  . pioglitazone (ACTOS) 30 MG tablet TAKE 1 TABLET BY MOUTH EVERY DAY (Patient taking differently: Take 30 mg by mouth daily. ) 90 tablet 0  . TRULICITY 1.93 XT/0.2IO SOPN INJECT 0.75 MG INTO THE SKIN ONCE A WEEK. 2 mL 11  . glipiZIDE (GLUCOTROL XL) 10 MG 24 hr tablet TAKE 1 TABLET BY MOUTH EVERY DAY WITH BREAKFAST (Patient not taking: Reported on 06/11/2020) 30 tablet 0   No current facility-administered medications on file prior to visit.   No Known Allergies Social History    Socioeconomic History  . Marital status: Married    Spouse name: Not on file  . Number of children: Not on file  . Years of education: Not on file  . Highest education level: Not on file  Occupational History  . Not on file  Tobacco Use  . Smoking status: Former Smoker    Years: 25.00    Types: Cigarettes    Quit date: 10/13/1991    Years since quitting: 28.6  . Smokeless tobacco: Never Used  Vaping Use  . Vaping Use: Never used  Substance and Sexual Activity  . Alcohol use: No    Comment: occassional glass of wine  . Drug use: No  . Sexual activity: Yes  Other Topics Concern  . Not on file  Social History Narrative  . Not on file   Social Determinants of Health   Financial Resource Strain:   . Difficulty of Paying Living Expenses: Not on file  Food Insecurity:   . Worried About Charity fundraiser in the Last Year: Not on file  . Ran Out of Food in the Last Year: Not on file  Transportation Needs:   . Lack of Transportation (Medical): Not on file  . Lack of Transportation (Non-Medical): Not on file  Physical Activity:   . Days of Exercise per Week: Not on file  . Minutes of Exercise per Session: Not on file  Stress:   . Feeling of Stress : Not on file  Social Connections:   . Frequency of Communication with Friends and Family: Not on file  . Frequency of Social Gatherings with Friends and Family: Not on file  . Attends Religious Services: Not on file  . Active Member of Clubs or Organizations: Not on file  . Attends Archivist Meetings: Not on file  . Marital Status: Not on file  Intimate Partner Violence:   . Fear of Current or Ex-Partner: Not on file  . Emotionally Abused: Not on file  . Physically Abused: Not on file  . Sexually Abused: Not on file      Review of Systems  All other systems reviewed and are negative.      Objective:   Physical Exam Vitals reviewed.  Constitutional:      General: He is not in acute distress.     Appearance: He is well-developed. He is not diaphoretic.  Neck:     Thyroid: No thyromegaly.     Vascular: No JVD.  Cardiovascular:     Rate and Rhythm: Normal rate and regular rhythm.     Heart sounds: Normal heart sounds. No murmur heard.  No friction rub. No gallop.  Pulmonary:     Effort: Pulmonary effort is normal. No respiratory distress.     Breath sounds: Normal breath sounds. No wheezing or rales.  Abdominal:     General: Bowel sounds are normal. There is no distension.     Palpations: Abdomen is soft. There is no mass.     Tenderness: There is no abdominal tenderness. There is no guarding or rebound.  Musculoskeletal:     Cervical back: Neck supple.  Lymphadenopathy:     Cervical: No cervical adenopathy.           Assessment & Plan:  Uncontrolled type 2 diabetes mellitus with hyperosmolarity without coma, with long-term current use of insulin ([place])  Patient is a very poor candidate to use insulin due to his confusion, dementia, as well as hearing disability.  I would be very concerned about him taking the medication appropriately.  However his blood sugars today sound reasonably acceptable for his age.  Therefore of asked him to start checking his fasting blood sugars and 2-hour postprandial sugars.  I will be willing to accept fasting blood sugars between 130 and 140 and 2-hour postprandial blood sugars in the evening under 200.  If he is in that range, I see no reason to add additional medication and I will check an A1c again in 3 months.  He will call me back prior to his surgery for an update.  If out of control he will have to add basal insulin

## 2020-06-19 ENCOUNTER — Other Ambulatory Visit: Payer: Self-pay

## 2020-06-19 ENCOUNTER — Encounter (HOSPITAL_COMMUNITY): Payer: Self-pay

## 2020-06-19 ENCOUNTER — Encounter (HOSPITAL_COMMUNITY)
Admission: RE | Admit: 2020-06-19 | Discharge: 2020-06-19 | Disposition: A | Discharge status: Home / Self Care | Payer: PPO | Source: Ambulatory Visit | Attending: Vascular Surgery | Admitting: Vascular Surgery

## 2020-06-19 DIAGNOSIS — I10 Essential (primary) hypertension: Secondary | ICD-10-CM | POA: Diagnosis not present

## 2020-06-19 DIAGNOSIS — Z01812 Encounter for preprocedural laboratory examination: Secondary | ICD-10-CM | POA: Diagnosis present

## 2020-06-19 DIAGNOSIS — Z01810 Encounter for preprocedural cardiovascular examination: Secondary | ICD-10-CM | POA: Insufficient documentation

## 2020-06-19 DIAGNOSIS — E1165 Type 2 diabetes mellitus with hyperglycemia: Secondary | ICD-10-CM | POA: Diagnosis not present

## 2020-06-19 DIAGNOSIS — R9431 Abnormal electrocardiogram [ECG] [EKG]: Secondary | ICD-10-CM | POA: Diagnosis not present

## 2020-06-19 LAB — CBC
HCT: 48.5 % (ref 39.0–52.0)
Hemoglobin: 14.9 g/dL (ref 13.0–17.0)
MCH: 30.2 pg (ref 26.0–34.0)
MCHC: 30.7 g/dL (ref 30.0–36.0)
MCV: 98.4 fL (ref 80.0–100.0)
Platelets: 177 10*3/uL (ref 150–400)
RBC: 4.93 MIL/uL (ref 4.22–5.81)
RDW: 13.8 % (ref 11.5–15.5)
WBC: 6.1 10*3/uL (ref 4.0–10.5)
nRBC: 0 % (ref 0.0–0.2)

## 2020-06-19 LAB — URINALYSIS, ROUTINE W REFLEX MICROSCOPIC
Bilirubin Urine: NEGATIVE
Glucose, UA: NEGATIVE mg/dL
Hgb urine dipstick: NEGATIVE
Ketones, ur: NEGATIVE mg/dL
Leukocytes,Ua: NEGATIVE
Nitrite: NEGATIVE
Protein, ur: NEGATIVE mg/dL
Specific Gravity, Urine: 1.008 (ref 1.005–1.030)
pH: 5 (ref 5.0–8.0)

## 2020-06-19 LAB — COMPREHENSIVE METABOLIC PANEL
ALT: 21 U/L (ref 0–44)
AST: 26 U/L (ref 15–41)
Albumin: 4.2 g/dL (ref 3.5–5.0)
Alkaline Phosphatase: 57 U/L (ref 38–126)
Anion gap: 11 (ref 5–15)
BUN: 21 mg/dL (ref 8–23)
CO2: 23 mmol/L (ref 22–32)
Calcium: 9.5 mg/dL (ref 8.9–10.3)
Chloride: 104 mmol/L (ref 98–111)
Creatinine, Ser: 1.67 mg/dL — ABNORMAL HIGH (ref 0.61–1.24)
GFR calc Af Amer: 42 mL/min — ABNORMAL LOW (ref >60)
GFR calc non Af Amer: 36 mL/min — ABNORMAL LOW (ref >60)
Glucose, Bld: 136 mg/dL — ABNORMAL HIGH (ref 70–99)
Potassium: 4 mmol/L (ref 3.5–5.1)
Sodium: 138 mmol/L (ref 135–145)
Total Bilirubin: 1.2 mg/dL (ref 0.3–1.2)
Total Protein: 7.4 g/dL (ref 6.5–8.1)

## 2020-06-19 LAB — TYPE AND SCREEN
ABO/RH(D): O NEG
Antibody Screen: NEGATIVE

## 2020-06-19 LAB — SURGICAL PCR SCREEN
MRSA, PCR: NEGATIVE
Staphylococcus aureus: NEGATIVE

## 2020-06-19 LAB — PROTIME-INR
INR: 1 (ref 0.8–1.2)
Prothrombin Time: 12.7 seconds (ref 11.4–15.2)

## 2020-06-19 LAB — GLUCOSE, CAPILLARY: Glucose-Capillary: 135 mg/dL — ABNORMAL HIGH (ref 70–99)

## 2020-06-19 LAB — APTT: aPTT: 30 seconds (ref 24–36)

## 2020-06-19 NOTE — Pre-Procedure Instructions (Signed)
[id number] [place], [place] - [address] [place] [address] Phone: [phone] Fax: [phone]      Your procedure is scheduled on Wednesday September 15th.  Report to [facility] Main Entrance "A" at 6:30 A.M., and check in at the Admitting office.  Call this number if you have problems the morning of surgery:  [phone]  Call [phone] if you have any questions prior to your surgery date Monday-Friday 8am-4pm    Remember:  Do not eat after midnight the night before your surgery    Take these medicines the morning of surgery with A SIP OF WATER  atenolol (TENORMIN)    As of today, STOP taking any Aspirin (unless otherwise instructed by your surgeon) Aleve, Naproxen, Ibuprofen, Motrin, Advil, Goody's, BC's, all herbal medications, fish oil, and all vitamins.   HOW TO MANAGE YOUR DIABETES BEFORE AND AFTER SURGERY  Why is it important to control my blood sugar before and after surgery? . Improving blood sugar levels before and after surgery helps healing and can limit problems. . A way of improving blood sugar control is eating a healthy diet by: o  Eating less sugar and carbohydrates o  Increasing activity/exercise o  Talking with your doctor about reaching your blood sugar goals . High blood sugars (greater than 180 mg/dL) can raise your risk of infections and slow your recovery, so you will need to focus on controlling your diabetes during the weeks before surgery. . Make sure that the doctor who takes care of your diabetes knows about your planned surgery including the date and location.  How do I manage my blood sugar before surgery? . Check your blood sugar at least 4 times a day, starting 2 days before surgery, to make sure that the level is not too high or low. o Check your blood sugar the morning of your surgery when you wake up and every 2 hours until you get to the Short Stay unit. . If your  blood sugar is less than 70 mg/dL, you will need to treat for low blood sugar: o Do not take insulin. o Treat a low blood sugar (less than 70 mg/dL) with  cup of clear juice (cranberry or apple), 4 glucose tablets, OR glucose gel. Recheck blood sugar in 15 minutes after treatment (to make sure it is greater than 70 mg/dL). If your blood sugar is not greater than 70 mg/dL on recheck, call [phone] o  for further instructions. . Report your blood sugar to the short stay nurse when you get to Short Stay.  . If you are admitted to the hospital after surgery: o Your blood sugar will be checked by the staff and you will probably be given insulin after surgery (instead of oral diabetes medicines) to make sure you have good blood sugar levels. o The goal for blood sugar control after surgery is 80-180 mg/dL.     WHAT DO I DO ABOUT MY DIABETES MEDICATION?   [name] Kitchen Do not take oral diabetes medicines (pills): glipiZIDE (GLUCOTROL XL), metFORMIN (GLUCOPHAGE)  the morning of surgery.  . THE NIGHT BEFORE SURGERY, do NOT take glipiZIDE (GLUCOTROL XL)     . The day of surgery, do not take other diabetes injectables, including Byetta (exenatide), Bydureon (exenatide ER), Victoza (liraglutide), or Trulicity (dulaglutide).                       Do not wear jewelry,  make up, or nail polish            Do not wear lotions, powders, perfumes/colognes, or deodorant.            Do not shave 48 hours prior to surgery.  Men may shave face and neck.            Do not bring valuables to the hospital.            [facility] is not responsible for any belongings or valuables.  Do NOT Smoke (Tobacco/Vaping) or drink Alcohol 24 hours prior to your procedure If you use a CPAP at night, you may bring all equipment for your overnight stay.   Contacts, glasses, dentures or bridgework may not be worn into surgery.      For patients admitted to the hospital, discharge time will be determined by your treatment team.    Patients discharged the day of surgery will not be allowed to drive home, and someone needs to stay with them for 24 hours.    Special instructions:   [facility]- Preparing For Surgery  Before surgery, you can play an important role. Because skin is not sterile, your skin needs to be as free of germs as possible. You can reduce the number of germs on your skin by washing with CHG (chlorahexidine gluconate) Soap before surgery.  CHG is an antiseptic cleaner which kills germs and bonds with the skin to continue killing germs even after washing.    Oral Hygiene is also important to reduce your risk of infection.  Remember - BRUSH YOUR TEETH THE MORNING OF SURGERY WITH YOUR REGULAR TOOTHPASTE  Please do not use if you have an allergy to CHG or antibacterial soaps. If your skin becomes reddened/irritated stop using the CHG.  Do not shave (including legs and underarms) for at least 48 hours prior to first CHG shower. It is OK to shave your face.  Please follow these instructions carefully.   1. Shower the NIGHT BEFORE SURGERY and the MORNING OF SURGERY with CHG Soap.   2. If you chose to wash your hair, wash your hair first as usual with your normal shampoo.  3. After you shampoo, rinse your hair and body thoroughly to remove the shampoo.  4. Use CHG as you would any other liquid soap. You can apply CHG directly to the skin and wash gently with a scrungie or a clean washcloth.   5. Apply the CHG Soap to your body ONLY FROM THE NECK DOWN.  Do not use on open wounds or open sores. Avoid contact with your eyes, ears, mouth and genitals (private parts). Wash Face and genitals (private parts)  with your normal soap.   6. Wash thoroughly, paying special attention to the area where your surgery will be performed.  7. Thoroughly rinse your body with warm water from the neck down.  8. DO NOT shower/wash with your normal soap after using and rinsing off the CHG Soap.  9. Pat yourself dry with a  CLEAN TOWEL.  10. Wear CLEAN PAJAMAS to bed the night before surgery  11. Place CLEAN SHEETS on your bed the night of your first shower and DO NOT SLEEP WITH PETS.   Day of Surgery: Wear Clean/Comfortable clothing the morning of surgery Do not apply any deodorants/lotions.   Remember to brush your teeth WITH YOUR REGULAR TOOTHPASTE.   Please read over the following fact sheets that you were given.

## 2020-06-19 NOTE — Progress Notes (Addendum)
PCP - Dr. Dennard Schaumann Cardiologist - denies  Chest x-ray - N/A  EKG - 06/19/20 Stress Test - denies  ECHO - denies Cardiac Cath - denies  Sleep Study - denies CPAP -   Fasting Blood Sugar - 150's Checks Blood Sugar -QOD   Aspirin Instructions: Patient instructed to hold all Aspirin, NSAID's, herbal medications, fish oil and vitamins 7 days prior to surgery.  Covid- 06/24/20 @ [facility]. Pt given written instructions with address, date and time of covid testing. Instructions given to quarantine   Anesthesia review: A1c 10.6; EKG  Patient denies shortness of breath, fever, cough and chest pain at PAT appointment   Patient verbalized understanding of instructions that were given to them at the PAT appointment. Patient was also instructed that they will need to review over the PAT instructions again at home before surgery.

## 2020-06-20 NOTE — Anesthesia Preprocedure Evaluation (Addendum)
Anesthesia Evaluation  Patient identified by MRN, date of birth, ID band Patient awake    Reviewed: Allergy & Precautions, NPO status , Patient's Chart, lab work & pertinent test results  Airway Mallampati: II  TM Distance: >3 FB Neck ROM: Full    Dental no notable dental hx.    Pulmonary neg pulmonary ROS, former smoker,    Pulmonary exam normal breath sounds clear to auscultation       Cardiovascular hypertension, + Peripheral Vascular Disease  Normal cardiovascular exam Rhythm:Regular Rate:Normal     Neuro/Psych Dementia negative neurological ROS     GI/Hepatic negative GI ROS, Neg liver ROS,   Endo/Other  diabetes  Renal/GU Renal InsufficiencyRenal disease  negative genitourinary   Musculoskeletal negative musculoskeletal ROS (+)   Abdominal   Peds negative pediatric ROS (+)  Hematology negative hematology ROS (+)   Anesthesia Other Findings   Reproductive/Obstetrics negative OB ROS                            Anesthesia Physical Anesthesia Plan  ASA: III  Anesthesia Plan: General   Post-op Pain Management:    Induction: Intravenous  PONV Risk Score and Plan: 2 and Ondansetron, Dexamethasone and Treatment may vary due to age or medical condition  Airway Management Planned: Oral ETT  Additional Equipment: Arterial line  Intra-op Plan:   Post-operative Plan: Extubation in OR  Informed Consent: I have reviewed the patients History and Physical, chart, labs and discussed the procedure including the risks, benefits and alternatives for the proposed anesthesia with the patient or authorized representative who has indicated his/her understanding and acceptance.     Dental advisory given  Plan Discussed with: CRNA and Surgeon  Anesthesia Plan Comments: (PAT note by Karoline Caldwell, PA-C: Follows with vascular surgery for hx of prior right carotid endarterectomy in 2008.  He had  recurrent stenosis and underwent redo endarterectomy in 2013. 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 Per Dr. Luther Parody note 06/04/20, "I have recommended that we do a formal exploration in the operating room.  Explained that that it is quite possible that this extends down to his Dacron prosthetic patch and if so the patch would have to be removed and replaced with vein patch."  Uncontrolled DMII. Last A1c 10.6 on 06/04/20. This is followed by PCP Dr. Dennard Schaumann. Per note 06/13/20, pt felt to be poor candidate to use insulin due to his confusion, dementia, as well as hearing disability.   Hx of CKD 3/4. Baseline creatinine around 1.7 based on review of labs. Creatinine 1.67 on preop labs. Remainder of labs unremarkable.   EKG 06/26/20: NSR. Rate 75. LAD.  Carotid US 06/04/20: Summary:  Right Carotid: Velocities in the right ICA are consistent with a 1-39% stenosis. Non-hemodynamically significant plaque <50% noted in the CCA. The ECA appears >50% stenosed.  Left Carotid: Velocities in the left ICA are consistent with a 60-79% stenosis. Non-hemodynamically significant plaque <50% noted in the CCA.  Vertebrals: Bilateral vertebral arteries demonstrate antegrade flow.  Subclavians: Normal flow hemodynamics were seen in bilateral subclavian arteries.  )       Anesthesia Quick Evaluation

## 2020-06-20 NOTE — Progress Notes (Signed)
Anesthesia Chart Review:  Follows with vascular surgery for hx of prior right carotid endarterectomy in 2008.  He had recurrent stenosis and underwent redo endarterectomy in 2013. 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 Per Dr. Luther Parody note 06/04/20, "I have recommended that we do a formal exploration in the operating room.  Explained that that it is quite possible that this extends down to his Dacron prosthetic patch and if so the patch would have to be removed and replaced with vein patch."  Uncontrolled DMII. Last A1c 10.6 on 06/04/20. This is followed by PCP Dr. Dennard Schaumann. Per note 06/13/20, pt felt to be poor candidate to use insulin due to his confusion, dementia, as well as hearing disability.   Hx of CKD 3/4. Baseline creatinine around 1.7 based on review of labs. Creatinine 1.67 on preop labs. Remainder of labs unremarkable.   EKG 06/26/20: NSR. Rate 75. LAD.  Carotid US 06/04/20: Summary:  Right Carotid: Velocities in the right ICA are consistent with a 1-39% stenosis. Non-hemodynamically significant plaque <50% noted in the CCA. The ECA appears >50% stenosed.  Left Carotid: Velocities in the left ICA are consistent with a 60-79% stenosis. Non-hemodynamically significant plaque <50% noted in the CCA.  Vertebrals: Bilateral vertebral arteries demonstrate antegrade flow.  Subclavians: Normal flow hemodynamics were seen in bilateral subclavian arteries.    Wynonia Musty [facility] Short Stay Center/Anesthesiology Phone [phone] 06/20/2020 1:44 PM

## 2020-06-24 ENCOUNTER — Inpatient Hospital Stay: Admission: RE | Admit: 2020-06-24 | Payer: PPO | Source: Ambulatory Visit

## 2020-06-24 ENCOUNTER — Other Ambulatory Visit
Admission: RE | Admit: 2020-06-24 | Discharge: 2020-06-24 | Disposition: A | Discharge status: Home / Self Care | Payer: PPO | Source: Ambulatory Visit | Attending: Vascular Surgery | Admitting: Vascular Surgery

## 2020-06-24 ENCOUNTER — Other Ambulatory Visit: Payer: Self-pay

## 2020-06-24 DIAGNOSIS — Z01812 Encounter for preprocedural laboratory examination: Secondary | ICD-10-CM | POA: Insufficient documentation

## 2020-06-24 DIAGNOSIS — Z20822 Contact with and (suspected) exposure to covid-19: Secondary | ICD-10-CM | POA: Diagnosis not present

## 2020-06-25 ENCOUNTER — Encounter (HOSPITAL_COMMUNITY): Payer: Self-pay | Admitting: Vascular Surgery

## 2020-06-25 LAB — SARS CORONAVIRUS 2 (TAT 6-24 HRS): SARS Coronavirus 2: NEGATIVE

## 2020-06-26 ENCOUNTER — Inpatient Hospital Stay (HOSPITAL_COMMUNITY): Payer: PPO | Admitting: Physician Assistant

## 2020-06-26 ENCOUNTER — Inpatient Hospital Stay (HOSPITAL_COMMUNITY): Payer: PPO | Admitting: Certified Registered Nurse Anesthetist

## 2020-06-26 ENCOUNTER — Encounter (HOSPITAL_COMMUNITY): Payer: Self-pay | Admitting: Vascular Surgery

## 2020-06-26 ENCOUNTER — Inpatient Hospital Stay (HOSPITAL_COMMUNITY)
Admission: RE | Admit: 2020-06-26 | Discharge: 2020-06-27 | DRG: 581 | Disposition: A | Discharge status: Home / Self Care | Payer: PPO | Attending: Vascular Surgery | Admitting: Vascular Surgery

## 2020-06-26 ENCOUNTER — Other Ambulatory Visit: Payer: Self-pay

## 2020-06-26 ENCOUNTER — Encounter (HOSPITAL_COMMUNITY)
Admission: RE | Disposition: A | Discharge status: Home / Self Care | Payer: Self-pay | Source: Home / Self Care | Attending: Vascular Surgery

## 2020-06-26 DIAGNOSIS — L089 Local infection of the skin and subcutaneous tissue, unspecified: Secondary | ICD-10-CM | POA: Diagnosis present

## 2020-06-26 DIAGNOSIS — T82898A Other specified complication of vascular prosthetic devices, implants and grafts, initial encounter: Secondary | ICD-10-CM

## 2020-06-26 DIAGNOSIS — I129 Hypertensive chronic kidney disease with stage 1 through stage 4 chronic kidney disease, or unspecified chronic kidney disease: Secondary | ICD-10-CM | POA: Diagnosis present

## 2020-06-26 DIAGNOSIS — I6521 Occlusion and stenosis of right carotid artery: Secondary | ICD-10-CM | POA: Diagnosis not present

## 2020-06-26 DIAGNOSIS — N183 Chronic kidney disease, stage 3 unspecified: Secondary | ICD-10-CM | POA: Diagnosis present

## 2020-06-26 DIAGNOSIS — E1122 Type 2 diabetes mellitus with diabetic chronic kidney disease: Secondary | ICD-10-CM | POA: Diagnosis present

## 2020-06-26 DIAGNOSIS — J302 Other seasonal allergic rhinitis: Secondary | ICD-10-CM | POA: Diagnosis not present

## 2020-06-26 DIAGNOSIS — H9190 Unspecified hearing loss, unspecified ear: Secondary | ICD-10-CM | POA: Diagnosis present

## 2020-06-26 DIAGNOSIS — E785 Hyperlipidemia, unspecified: Secondary | ICD-10-CM | POA: Diagnosis present

## 2020-06-26 DIAGNOSIS — I10 Essential (primary) hypertension: Secondary | ICD-10-CM | POA: Diagnosis not present

## 2020-06-26 DIAGNOSIS — I9789 Other postprocedural complications and disorders of the circulatory system, not elsewhere classified: Secondary | ICD-10-CM | POA: Diagnosis not present

## 2020-06-26 HISTORY — PX: WOUND EXPLORATION: SHX6188

## 2020-06-26 HISTORY — PX: ENDARTERECTOMY: SHX5162

## 2020-06-26 LAB — GLUCOSE, CAPILLARY
Glucose-Capillary: 104 mg/dL — ABNORMAL HIGH (ref 70–99)
Glucose-Capillary: 152 mg/dL — ABNORMAL HIGH (ref 70–99)
Glucose-Capillary: 226 mg/dL — ABNORMAL HIGH (ref 70–99)
Glucose-Capillary: 209 mg/dL — ABNORMAL HIGH (ref 70–99)

## 2020-06-26 SURGERY — WOUND EXPLORATION
Anesthesia: General | Laterality: Right

## 2020-06-26 MED ORDER — POLYETHYLENE GLYCOL 3350 17 G PO PACK: 17.0000 g | PACK | Freq: Every day | ORAL | Status: DC | PRN

## 2020-06-26 MED ORDER — ATENOLOL 50 MG PO TABS
50.0000 mg | ORAL_TABLET | Freq: Once | ORAL | Status: AC
  Administered 2020-06-26: 50 mg via ORAL
  Filled 2020-06-26: qty 1

## 2020-06-26 MED ORDER — EPHEDRINE SULFATE-NACL 50-0.9 MG/10ML-% IV SOSY
PREFILLED_SYRINGE | INTRAVENOUS | Status: DC | PRN
  Administered 2020-06-26: 5 mg via INTRAVENOUS

## 2020-06-26 MED ORDER — LABETALOL HCL 5 MG/ML IV SOLN
INTRAVENOUS | Status: AC
  Filled 2020-06-26: qty 4

## 2020-06-26 MED ORDER — MAGNESIUM SULFATE 2 GM/50ML IV SOLN: 2.0000 g | Freq: Every day | INTRAVENOUS | Status: DC | PRN

## 2020-06-26 MED ORDER — PROPOFOL 10 MG/ML IV BOLUS
INTRAVENOUS | Status: DC | PRN
  Administered 2020-06-26: 100 mg via INTRAVENOUS

## 2020-06-26 MED ORDER — ACETAMINOPHEN 10 MG/ML IV SOLN: 1000.0000 mg | Freq: Once | INTRAVENOUS | Status: DC | PRN

## 2020-06-26 MED ORDER — GLIPIZIDE ER 10 MG PO TB24
10.0000 mg | ORAL_TABLET | Freq: Every day | ORAL | Status: DC
  Administered 2020-06-27: 10 mg via ORAL
  Filled 2020-06-26: qty 1

## 2020-06-26 MED ORDER — ALUM & MAG HYDROXIDE-SIMETH 200-200-20 MG/5ML PO SUSP: 15.0000 mL | ORAL | Status: DC | PRN

## 2020-06-26 MED ORDER — CEFAZOLIN SODIUM-DEXTROSE 2-4 GM/100ML-% IV SOLN
2.0000 g | Freq: Two times a day (BID) | INTRAVENOUS | Status: AC
  Administered 2020-06-26 – 2020-06-27 (×2): 2 g via INTRAVENOUS
  Filled 2020-06-26 (×2): qty 100

## 2020-06-26 MED ORDER — PIOGLITAZONE HCL 30 MG PO TABS
30.0000 mg | ORAL_TABLET | Freq: Every day | ORAL | Status: DC
  Administered 2020-06-27: 30 mg via ORAL
  Filled 2020-06-26: qty 1

## 2020-06-26 MED ORDER — ROCURONIUM BROMIDE 10 MG/ML (PF) SYRINGE
PREFILLED_SYRINGE | INTRAVENOUS | Status: AC
  Filled 2020-06-26: qty 10

## 2020-06-26 MED ORDER — FENTANYL CITRATE (PF) 250 MCG/5ML IJ SOLN
INTRAMUSCULAR | Status: AC
  Filled 2020-06-26: qty 5

## 2020-06-26 MED ORDER — FUROSEMIDE 20 MG PO TABS
20.0000 mg | ORAL_TABLET | Freq: Every day | ORAL | Status: DC
  Administered 2020-06-26 – 2020-06-27 (×2): 20 mg via ORAL
  Filled 2020-06-26 (×2): qty 1

## 2020-06-26 MED ORDER — ASPIRIN EC 81 MG PO TBEC
81.0000 mg | DELAYED_RELEASE_TABLET | Freq: Every day | ORAL | Status: DC
  Administered 2020-06-27: 81 mg via ORAL
  Filled 2020-06-26: qty 1

## 2020-06-26 MED ORDER — LIDOCAINE HCL (PF) 1 % IJ SOLN
INTRAMUSCULAR | Status: AC
  Filled 2020-06-26: qty 30

## 2020-06-26 MED ORDER — DEXAMETHASONE SODIUM PHOSPHATE 10 MG/ML IJ SOLN
INTRAMUSCULAR | Status: AC
  Filled 2020-06-26: qty 1

## 2020-06-26 MED ORDER — PANTOPRAZOLE SODIUM 40 MG PO TBEC
40.0000 mg | DELAYED_RELEASE_TABLET | Freq: Every day | ORAL | Status: DC
  Administered 2020-06-26 – 2020-06-27 (×2): 40 mg via ORAL
  Filled 2020-06-26 (×2): qty 1

## 2020-06-26 MED ORDER — ATENOLOL 25 MG PO TABS
50.0000 mg | ORAL_TABLET | Freq: Every day | ORAL | Status: DC
  Administered 2020-06-26 – 2020-06-27 (×2): 50 mg via ORAL
  Filled 2020-06-26 (×2): qty 2

## 2020-06-26 MED ORDER — SODIUM CHLORIDE 0.9 % IV SOLN
INTRAVENOUS | Status: AC
  Filled 2020-06-26: qty 1.2

## 2020-06-26 MED ORDER — DEXAMETHASONE SODIUM PHOSPHATE 10 MG/ML IJ SOLN
INTRAMUSCULAR | Status: DC | PRN
  Administered 2020-06-26: 5 mg via INTRAVENOUS

## 2020-06-26 MED ORDER — PHENYLEPHRINE 40 MCG/ML (10ML) SYRINGE FOR IV PUSH (FOR BLOOD PRESSURE SUPPORT)
PREFILLED_SYRINGE | INTRAVENOUS | Status: DC | PRN
  Administered 2020-06-26 (×4): 80 ug via INTRAVENOUS
  Administered 2020-06-26: 40 ug via INTRAVENOUS
  Administered 2020-06-26: 80 ug via INTRAVENOUS
  Administered 2020-06-26: 40 ug via INTRAVENOUS
  Administered 2020-06-26 (×2): 80 ug via INTRAVENOUS
  Administered 2020-06-26: 40 ug via INTRAVENOUS

## 2020-06-26 MED ORDER — SODIUM CHLORIDE 0.9 % IV SOLN: INTRAVENOUS | Status: DC

## 2020-06-26 MED ORDER — SODIUM CHLORIDE 0.9 % IV SOLN
0.0125 ug/kg/min | INTRAVENOUS | Status: AC
  Filled 2020-06-26: qty 2000

## 2020-06-26 MED ORDER — CHLORHEXIDINE GLUCONATE 0.12 % MT SOLN: 15.0000 mL | Freq: Once | OROMUCOSAL | Status: AC

## 2020-06-26 MED ORDER — PHENYLEPHRINE HCL-NACL 10-0.9 MG/250ML-% IV SOLN
INTRAVENOUS | Status: DC | PRN
  Administered 2020-06-26: 50 ug/min via INTRAVENOUS
  Administered 2020-06-26: 90 ug/min via INTRAVENOUS

## 2020-06-26 MED ORDER — ONDANSETRON HCL 4 MG/2ML IJ SOLN
INTRAMUSCULAR | Status: DC | PRN
  Administered 2020-06-26: 4 mg via INTRAVENOUS

## 2020-06-26 MED ORDER — LIDOCAINE 2% (20 MG/ML) 5 ML SYRINGE
INTRAMUSCULAR | Status: DC | PRN
  Administered 2020-06-26: 100 mg via INTRAVENOUS

## 2020-06-26 MED ORDER — ACETAMINOPHEN 325 MG PO TABS: 325.0000 mg | ORAL_TABLET | ORAL | Status: DC | PRN

## 2020-06-26 MED ORDER — HYDROMORPHONE HCL 1 MG/ML IJ SOLN: 0.2500 mg | INTRAMUSCULAR | Status: DC | PRN

## 2020-06-26 MED ORDER — PROPOFOL 10 MG/ML IV BOLUS
INTRAVENOUS | Status: AC
  Filled 2020-06-26: qty 20

## 2020-06-26 MED ORDER — LACTATED RINGERS IV SOLN: INTRAVENOUS | Status: DC

## 2020-06-26 MED ORDER — 0.9 % SODIUM CHLORIDE (POUR BTL) OPTIME
TOPICAL | Status: DC | PRN
  Administered 2020-06-26 (×2): 1000 mL

## 2020-06-26 MED ORDER — PHENYLEPHRINE 40 MCG/ML (10ML) SYRINGE FOR IV PUSH (FOR BLOOD PRESSURE SUPPORT)
PREFILLED_SYRINGE | INTRAVENOUS | Status: AC
  Filled 2020-06-26: qty 10

## 2020-06-26 MED ORDER — PROTAMINE SULFATE 10 MG/ML IV SOLN
INTRAVENOUS | Status: DC | PRN
  Administered 2020-06-26: 20 mg via INTRAVENOUS
  Administered 2020-06-26: 10 mg via INTRAVENOUS
  Administered 2020-06-26: 20 mg via INTRAVENOUS

## 2020-06-26 MED ORDER — LIDOCAINE 2% (20 MG/ML) 5 ML SYRINGE
INTRAMUSCULAR | Status: AC
  Filled 2020-06-26: qty 5

## 2020-06-26 MED ORDER — LABETALOL HCL 5 MG/ML IV SOLN
INTRAVENOUS | Status: DC | PRN
  Administered 2020-06-26 (×2): 5 mg via INTRAVENOUS

## 2020-06-26 MED ORDER — GUAIFENESIN-DM 100-10 MG/5ML PO SYRP: 15.0000 mL | ORAL_SOLUTION | ORAL | Status: DC | PRN

## 2020-06-26 MED ORDER — PHENOL 1.4 % MT LIQD: 1.0000 | OROMUCOSAL | Status: DC | PRN

## 2020-06-26 MED ORDER — SODIUM CHLORIDE 0.9 % IV SOLN: 500.0000 mL | Freq: Once | INTRAVENOUS | Status: DC | PRN

## 2020-06-26 MED ORDER — DOCUSATE SODIUM 100 MG PO CAPS
100.0000 mg | ORAL_CAPSULE | Freq: Every day | ORAL | Status: DC
  Administered 2020-06-27: 100 mg via ORAL
  Filled 2020-06-26: qty 1

## 2020-06-26 MED ORDER — LABETALOL HCL 5 MG/ML IV SOLN
10.0000 mg | INTRAVENOUS | Status: DC | PRN
  Administered 2020-06-26 (×3): 10 mg via INTRAVENOUS
  Filled 2020-06-26 (×4): qty 4

## 2020-06-26 MED ORDER — ACETAMINOPHEN 650 MG RE SUPP: 325.0000 mg | RECTAL | Status: DC | PRN

## 2020-06-26 MED ORDER — CHLORHEXIDINE GLUCONATE CLOTH 2 % EX PADS: 6.0000 | MEDICATED_PAD | Freq: Once | CUTANEOUS | Status: DC

## 2020-06-26 MED ORDER — OXYCODONE-ACETAMINOPHEN 5-325 MG PO TABS: 1.0000 | ORAL_TABLET | ORAL | Status: DC | PRN

## 2020-06-26 MED ORDER — CHLORHEXIDINE GLUCONATE 0.12 % MT SOLN
OROMUCOSAL | Status: AC
  Administered 2020-06-26: 15 mL via OROMUCOSAL
  Filled 2020-06-26: qty 15

## 2020-06-26 MED ORDER — HEPARIN SODIUM (PORCINE) 1000 UNIT/ML IJ SOLN
INTRAMUSCULAR | Status: DC | PRN
  Administered 2020-06-26: 8000 [IU] via INTRAVENOUS
  Administered 2020-06-26: 5000 [IU] via INTRAVENOUS

## 2020-06-26 MED ORDER — ONDANSETRON HCL 4 MG/2ML IJ SOLN: 4.0000 mg | Freq: Four times a day (QID) | INTRAMUSCULAR | Status: DC | PRN

## 2020-06-26 MED ORDER — SUGAMMADEX SODIUM 200 MG/2ML IV SOLN
INTRAVENOUS | Status: DC | PRN
  Administered 2020-06-26: 200 mg via INTRAVENOUS

## 2020-06-26 MED ORDER — CEFAZOLIN SODIUM-DEXTROSE 2-4 GM/100ML-% IV SOLN
INTRAVENOUS | Status: AC
  Filled 2020-06-26: qty 100

## 2020-06-26 MED ORDER — ORAL CARE MOUTH RINSE: 15.0000 mL | Freq: Once | OROMUCOSAL | Status: AC

## 2020-06-26 MED ORDER — INSULIN ASPART 100 UNIT/ML ~~LOC~~ SOLN
0.0000 [IU] | Freq: Three times a day (TID) | SUBCUTANEOUS | Status: DC
  Administered 2020-06-26: 5 [IU] via SUBCUTANEOUS
  Administered 2020-06-27: 3 [IU] via SUBCUTANEOUS
  Administered 2020-06-27: 5 [IU] via SUBCUTANEOUS

## 2020-06-26 MED ORDER — BISACODYL 10 MG RE SUPP: 10.0000 mg | Freq: Every day | RECTAL | Status: DC | PRN

## 2020-06-26 MED ORDER — ROCURONIUM BROMIDE 10 MG/ML (PF) SYRINGE
PREFILLED_SYRINGE | INTRAVENOUS | Status: DC | PRN
  Administered 2020-06-26: 60 mg via INTRAVENOUS
  Administered 2020-06-26 (×3): 20 mg via INTRAVENOUS

## 2020-06-26 MED ORDER — HYDROMORPHONE HCL 1 MG/ML IJ SOLN: 0.5000 mg | INTRAMUSCULAR | Status: DC | PRN

## 2020-06-26 MED ORDER — PHENYLEPHRINE HCL (PRESSORS) 10 MG/ML IV SOLN
INTRAVENOUS | Status: AC
  Filled 2020-06-26: qty 1

## 2020-06-26 MED ORDER — HEPARIN SODIUM (PORCINE) 1000 UNIT/ML IJ SOLN
INTRAMUSCULAR | Status: AC
  Filled 2020-06-26: qty 1

## 2020-06-26 MED ORDER — LACTATED RINGERS IV SOLN: INTRAVENOUS | Status: DC | PRN

## 2020-06-26 MED ORDER — SODIUM CHLORIDE 0.9 % IV SOLN
0.0125 ug/kg/min | INTRAVENOUS | Status: AC
  Administered 2020-06-26: .15 ug/kg/min via INTRAVENOUS
  Administered 2020-06-26: .1 ug/kg/min via INTRAVENOUS
  Filled 2020-06-26: qty 2000

## 2020-06-26 MED ORDER — FENTANYL CITRATE (PF) 100 MCG/2ML IJ SOLN
INTRAMUSCULAR | Status: DC | PRN
Start: 2020-06-26 — End: 2020-06-26
  Administered 2020-06-26: 100 ug via INTRAVENOUS

## 2020-06-26 MED ORDER — CEFAZOLIN SODIUM-DEXTROSE 2-4 GM/100ML-% IV SOLN
2.0000 g | INTRAVENOUS | Status: AC
  Administered 2020-06-26: 2 g via INTRAVENOUS

## 2020-06-26 MED ORDER — ONDANSETRON HCL 4 MG/2ML IJ SOLN
INTRAMUSCULAR | Status: AC
  Filled 2020-06-26: qty 2

## 2020-06-26 MED ORDER — POTASSIUM CHLORIDE CRYS ER 20 MEQ PO TBCR: 20.0000 meq | EXTENDED_RELEASE_TABLET | Freq: Every day | ORAL | Status: DC | PRN

## 2020-06-26 MED ORDER — HYDRALAZINE HCL 20 MG/ML IJ SOLN: 5.0000 mg | INTRAMUSCULAR | Status: DC | PRN

## 2020-06-26 MED ORDER — SODIUM CHLORIDE 0.9 % IV SOLN
INTRAVENOUS | Status: DC | PRN
  Administered 2020-06-26: 500 mL

## 2020-06-26 MED ORDER — METOPROLOL TARTRATE 5 MG/5ML IV SOLN: 2.0000 mg | INTRAVENOUS | Status: DC | PRN

## 2020-06-26 MED ORDER — EPHEDRINE 5 MG/ML INJ
INTRAVENOUS | Status: AC
  Filled 2020-06-26: qty 10

## 2020-06-26 SURGICAL SUPPLY — 52 items
ADH SKN CLS APL DERMABOND .7 (GAUZE/BANDAGES/DRESSINGS) ×1
ADH SKN CLS LQ APL DERMABOND (GAUZE/BANDAGES/DRESSINGS) ×2
CANISTER SUCT 3000ML PPV (MISCELLANEOUS) ×2 IMPLANT
CANNULA VESSEL 3MM 2 BLNT TIP (CANNULA) ×4 IMPLANT
CATH ROBINSON RED A/P 18FR (CATHETERS) ×2 IMPLANT
CLIP LIGATING EXTRA MED SLVR (CLIP) ×2 IMPLANT
CLIP LIGATING EXTRA SM BLUE (MISCELLANEOUS) ×2 IMPLANT
COVER WAND RF STERILE (DRAPES) ×2 IMPLANT
DECANTER SPIKE VIAL GLASS SM (MISCELLANEOUS) IMPLANT
DERMABOND ADHESIVE PROPEN (GAUZE/BANDAGES/DRESSINGS) ×2
DERMABOND ADVANCED (GAUZE/BANDAGES/DRESSINGS) ×1
DERMABOND ADVANCED .7 DNX12 (GAUZE/BANDAGES/DRESSINGS) ×1 IMPLANT
DERMABOND ADVANCED .7 DNX6 (GAUZE/BANDAGES/DRESSINGS) ×2 IMPLANT
DRAIN HEMOVAC 1/8 X 5 (WOUND CARE) IMPLANT
DRAIN JP 10F RND SILICONE (MISCELLANEOUS) ×2 IMPLANT
ELECT REM PT RETURN 9FT ADLT (ELECTROSURGICAL) ×2
ELECTRODE REM PT RTRN 9FT ADLT (ELECTROSURGICAL) ×1 IMPLANT
EVACUATOR SILICONE 100CC (DRAIN) ×2 IMPLANT
GAUZE SPONGE 4X4 12PLY STRL (GAUZE/BANDAGES/DRESSINGS) ×2 IMPLANT
GLOVE BIOGEL PI IND STRL 6.5 (GLOVE) ×1 IMPLANT
GLOVE BIOGEL PI INDICATOR 6.5 (GLOVE) ×1
GLOVE SS BIOGEL STRL SZ 7.5 (GLOVE) ×1 IMPLANT
GLOVE SUPERSENSE BIOGEL SZ 7.5 (GLOVE) ×1
GLOVE SURG SS PI 6.5 STRL IVOR (GLOVE) ×2 IMPLANT
GOWN STRL REUS W/ TWL LRG LVL3 (GOWN DISPOSABLE) ×3 IMPLANT
GOWN STRL REUS W/TWL LRG LVL3 (GOWN DISPOSABLE) ×6
KIT BASIN OR (CUSTOM PROCEDURE TRAY) ×2 IMPLANT
KIT SHUNT ARGYLE CAROTID ART 6 (VASCULAR PRODUCTS) IMPLANT
KIT TURNOVER KIT B (KITS) ×2 IMPLANT
NEEDLE 22X1 1/2 (OR ONLY) (NEEDLE) IMPLANT
NS IRRIG 1000ML POUR BTL (IV SOLUTION) ×4 IMPLANT
PACK CAROTID (CUSTOM PROCEDURE TRAY) ×2 IMPLANT
PAD ARMBOARD 7.5X6 YLW CONV (MISCELLANEOUS) ×4 IMPLANT
POSITIONER HEAD DONUT 9IN (MISCELLANEOUS) ×2 IMPLANT
SHUNT CAROTID BYPASS 10 (VASCULAR PRODUCTS) ×2 IMPLANT
SHUNT CAROTID BYPASS 12FRX15.5 (VASCULAR PRODUCTS) IMPLANT
SUT ETHILON 3 0 FSL (SUTURE) ×2 IMPLANT
SUT ETHILON 3 0 PS 1 (SUTURE) IMPLANT
SUT PROLENE 5 0 C 1 24 (SUTURE) ×4 IMPLANT
SUT PROLENE 5 0 C 1 36 (SUTURE) ×2 IMPLANT
SUT PROLENE 6 0 CC (SUTURE) ×8 IMPLANT
SUT SILK 3 0 (SUTURE)
SUT SILK 3-0 18XBRD TIE 12 (SUTURE) IMPLANT
SUT VIC AB 3-0 SH 27 (SUTURE) ×6
SUT VIC AB 3-0 SH 27X BRD (SUTURE) ×3 IMPLANT
SUT VIC AB 4-0 PS2 18 (SUTURE) ×2 IMPLANT
SUT VICRYL 4-0 PS2 18IN ABS (SUTURE) ×2 IMPLANT
SWAB COLLECTION DEVICE MRSA (MISCELLANEOUS) ×4 IMPLANT
SWAB CULTURE ESWAB REG 1ML (MISCELLANEOUS) ×4 IMPLANT
SYR CONTROL 10ML LL (SYRINGE) IMPLANT
TOWEL GREEN STERILE (TOWEL DISPOSABLE) ×2 IMPLANT
WATER STERILE IRR 1000ML POUR (IV SOLUTION) ×2 IMPLANT

## 2020-06-26 NOTE — Anesthesia Procedure Notes (Signed)
Procedure Name: Intubation Date/Time: 06/26/2020 8:42 AM Performed by: Candis Shine, CRNA Pre-anesthesia Checklist: Patient identified, Emergency Drugs available, Suction available and Patient being monitored Patient Re-evaluated:Patient Re-evaluated prior to induction Oxygen Delivery Method: Circle System Utilized Preoxygenation: Pre-oxygenation with 100% oxygen Induction Type: IV induction Ventilation: Mask ventilation without difficulty and Oral airway inserted - appropriate to patient size Laryngoscope Size: Mac and 4 Grade View: Grade I Tube type: Oral Tube size: 7.5 mm Number of attempts: 1 Airway Equipment and Method: Stylet and Oral airway Placement Confirmation: ETT inserted through vocal cords under direct vision,  positive ETCO2 and breath sounds checked- equal and bilateral Secured at: 22 cm Tube secured with: Tape Dental Injury: Teeth and Oropharynx as per pre-operative assessment

## 2020-06-26 NOTE — Interval H&P Note (Signed)
History and Physical Interval Note:  06/26/2020 7:46 AM  [name]  has presented today for surgery, with the diagnosis of INFECTION RIGHT NECK.  The various methods of treatment have been discussed with the patient and family. After consideration of risks, benefits and other options for treatment, the patient has consented to  Procedure(s): EXPLORATION OF RIGHT NECK INCISION (Right) REDO CAROTID ENDARTERECTOMY (Right) as a surgical intervention.  The patient's history has been reviewed, patient examined, no change in status, stable for surgery.  I have reviewed the patient's chart and labs.  Questions were answered to the patient's satisfaction.     Curt Jews

## 2020-06-26 NOTE — Anesthesia Postprocedure Evaluation (Signed)
Anesthesia Post Note  Patient: [name]  Procedure(s) Performed: EXPLORATION OF RIGHT NECK INCISION (Right ) REDO CAROTID ENDARTERECTOMY (Right )     Patient location during evaluation: PACU Anesthesia Type: General Level of consciousness: awake and alert Pain management: pain level controlled Vital Signs Assessment: post-procedure vital signs reviewed and stable Respiratory status: spontaneous breathing, nonlabored ventilation, respiratory function stable and patient connected to nasal cannula oxygen Cardiovascular status: blood pressure returned to baseline and stable Postop Assessment: no apparent nausea or vomiting Anesthetic complications: no   No complications documented.  Last Vitals:  Vitals:   06/26/20 1530 06/26/20 1630  BP: (!) 158/82 (!) 150/86  Pulse: 93 96  Resp: 17 20  Temp:  36.6 C  SpO2: 96% 96%    Last Pain:  Vitals:   06/26/20 1630  TempSrc: Oral  PainSc: 0-No pain                 [name] S

## 2020-06-26 NOTE — Discharge Instructions (Signed)
   [facility]   Discharge Instructions   Carotid Surgery  Please refer to the following instructions for your post-procedure care. Your surgeon or physician assistant will discuss any changes with you.  Activity  You are encouraged to walk as much as you can. You can slowly return to normal activities but must avoid strenuous activity and heavy lifting until your doctor tell you it's okay. Avoid activities such as vacuuming or swinging a golf club. You can drive after one week if you are comfortable and you are no longer taking prescription pain medications. It is normal to feel tired for serval weeks after your surgery. It is also normal to have difficulty with sleep habits, eating, and bowel movements after surgery. These will go away with time.  Bathing/Showering  Shower daily after you go home. Do not soak in a bathtub, hot tub, or swim until the incision heals completely.  Incision Care  Shower every day. Clean your incision with mild soap and water. Pat the area dry with a clean towel. You do not need a bandage unless otherwise instructed. Do not apply any ointments or creams to your incision. You may have skin glue on your incision. Do not peel it off. It will come off on its own in about one week. Your incision may feel thickened and raised for several weeks after your surgery. This is normal and the skin will soften over time.   For Men Only: It's okay to shave around the incision but do not shave the incision itself for 2 weeks. It is common to have numbness under your chin that could last for several months.  Diet  Resume your normal diet. There are no special food restrictions following this procedure. A low fat/low cholesterol diet is recommended for all patients with vascular disease. In order to heal from your surgery, it is CRITICAL to get adequate nutrition. Your body requires vitamins, minerals, and protein. Vegetables are the best source of  vitamins and minerals. Vegetables also provide the perfect balance of protein. Processed food has little nutritional value, so try to avoid this.  Medications  Resume taking all of your medications unless your doctor or physician assistant tells you not to. If your incision is causing pain, you may take over-the- counter pain relievers such as acetaminophen (Tylenol). If you were prescribed a stronger pain medication, please be aware these medications can cause nausea and constipation. Prevent nausea by taking the medication with a snack or meal. Avoid constipation by drinking plenty of fluids and eating foods with a high amount of fiber, such as fruits, vegetables, and grains.   Do not take Tylenol if you are taking prescription pain medications.  Follow Up  Our office will schedule a follow up appointment 2-3 weeks following discharge.  Please call us immediately for any of the following conditions  . Increased pain, redness, drainage (pus) from your incision site. . Fever of 101 degrees or higher. . If you should develop stroke (slurred speech, difficulty swallowing, weakness on one side of your body, loss of vision) you should call 911 and go to the nearest emergency room. .  Reduce your risk of vascular disease:  . Stop smoking. If you would like help call [facility] at 1-800-QUIT-NOW (1-800-784-8669) or [facility] at 336-586-4000. . Manage your cholesterol . Maintain a desired weight . Control your diabetes . Keep your blood pressure down .  If you have any questions, please call the office at [phone]. 

## 2020-06-26 NOTE — Op Note (Signed)
OPERATIVE REPORT  DATE OF SURGERY: 06/26/2020  PATIENT: [name], 86 y.o. 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  PHYSICIAN ASSISTANT: Rhyne PAC  The assistant was needed for exposure and to expedite the case  ANESTHESIA: General  EBL: per anesthesia record  Total I/O In: 1600 [I.V.:1600] Out: 519 [Drains:19; Blood:500]  BLOOD ADMINISTERED: none  DRAINS: none  SPECIMEN: none  COUNTS CORRECT:  YES  PATIENT DISPOSITION:  PACU - hemodynamically stable  PROCEDURE DETAILS: Patient was taken operating placed supine position where the area of the right neck prepped draped you sterile fashion.  The left leg was also prepped from the knee distally.  A ellipse of incision skin was made around the prior scar in the lower aspect of the prior carotid endarterectomy incision.  The wound was cultured for aerobic and anaerobic bacteria prior to prepping the skin.  The patient did have a sinus tract that tracked down to the prior carotid endarterectomy site and the Dacron patch.  The common carotid artery was encircled proximal to the patch and also distal to the area of the sinus tract.  There was dense scarring present.  The vagus nerve was identified in the scar tissue and was avoided as much as possible.  The patient was given 8000 units intravenous heparin affect circulation time the artery was occluded proximally.  The sinus tract was excised at the level of the patch.  The patch was involved in the base of this wound but there was no disruption of the super suture line.  There was  obvious amatory material around the patch but no frank pus.  Due to the patch being poorly incorporated at this area and then tunneling above this I decided to remove all Dacron material.  The clamp was removed from the proximal carotid once it was clear that the suture line was not disrupted.  Dissection was continued distally and the superior thyroid artery was encircled with a 2-0 silk Potts ties.  The external carotid was circled with a blue vessel loop and the internal carotid was encircled with an umbilical tape around tourniquet above the level of the prior patch.  The internal/external and common carotid arteries were occluded and the patch was removed from the old endarterectomy site.  Again there was no frank pus noted.  There was extensive inflammation and thickening of the carotid.  I felt that best repair would be resection of all of this area with an interposition vein graft from the common carotid to the internal carotid above the old patch.  The patient had a shunt placed during the initial exploration and removal of the patch.  The hypoglossal nerve was identified and preserved.  The tube was encased in scar a separate incision was made at the left ankle and saphenous vein was harvested and was of excellent size.  This was brought onto the field.  The shunt was removed and the common carotid artery was occluded proximally and the internal carotid distally.  The external carotid and superior thyroid artery were ligated.  The common carotid artery was transected proximal to the prior endarterectomy.  The vein  was spatulated and a carotid was spatulated.  The vein was sewn into into the common carotid artery in a reverse fashion with a running 6-0 Prolene suture.  The anastomosis was tested and found to be adequate.  Finally the vein was cut to the appropriate length and was spatulated and sewn into into the internal carotid artery.  Prior to completion of the anastomosis forward and backward flushing  was undertaken.  The anastomosis was then completed and excellent Doppler flow was noted in the internal carotid artery.  The patient was given 50 mg of protamine to reverse heparin.  Wounds irrigated with saline.  Hemostasis left cautery.  Due to the extensive scarring and dissection there was some oozing from all surfaces.  For this reason decision was made to place a 15 Blake drain in the base of the wound.  This was brought out through a separate stab in the supra sternal area.  The incision was closed with interrupted 3-0 Vicryl sutures to reapproximate sternocleidomastoid over the carotid sheath.  Next the platysma was closed with a running 3-0 Vicryl suture and find the skin was closed with a 4 subcuticular Vicryl stitch.  The patient was awakened in the operating room neurologically intact and was transferred to the recovery room in stable condition   Rosetta Posner, M.D., [facility] 06/26/2020 4:17 PM

## 2020-06-26 NOTE — Progress Notes (Signed)
    Patient alert moving all 4 ext, grip 5/5 B Neck incision without hematoma No tongue deviation Left medial ankle incision clean and dry   S/P re do right carotid CEA with with excision of sinus tract and interposition graft with left reversed great saphenous vein.  Stable post op disposition  Roxy Horseman PA-C

## 2020-06-26 NOTE — Progress Notes (Signed)
  Day of Surgery Note    Subjective:  Seen in pacu-pt appears comfortable   Vitals:   06/26/20 1325 06/26/20 1340  BP: 135/88 123/74  Pulse: (!) 101 99  Resp: 16 14  Temp: 97.6 F (36.4 C)   SpO2: 100% 100%    Incisions:   Right neck incision is clean and dry with JP drain in place with minimal output.  Left ankle incision is clean and dry Extremities:  Moving all extremities equally Cardiac:  regular Lungs:  Non labored Neuro:  In tact; tongue is midline   Assessment/Plan:  This is a 86 y.o. male who is s/p  Redo exposure right carotid endarterectomy with excision of sinus tract and interposition graft with left reversed great saphenous vein.  -pt doing well in recovery with neuro in tact and tongue is midline.   -minimal output in JP drain at this time.  Right neck and left ankle incisions look good.   -to 4 east later today -SS for DM.  Will hold restarting Metformin right now as his creatinine is 1.67.     Leontine Locket, PA-C 06/26/2020 1:42 PM [phone]

## 2020-06-26 NOTE — Anesthesia Procedure Notes (Signed)
Arterial Line Insertion Start/End9/15/2021 8:00 AM Performed by: Candis Shine, CRNA, CRNA  Patient location: Pre-op. Preanesthetic checklist: patient identified, IV checked, site marked, risks and benefits discussed, surgical consent, monitors and equipment checked, pre-op evaluation, timeout performed and anesthesia consent Lidocaine 1% used for infiltration Right, radial was placed Catheter size: 20 G Hand hygiene performed  and maximum sterile barriers used   Attempts: 1 Procedure performed without using ultrasound guided technique. Following insertion, dressing applied and Biopatch. Post procedure assessment: normal and unchanged  Patient tolerated the procedure well with no immediate complications.

## 2020-06-26 NOTE — Transfer of Care (Signed)
Immediate Anesthesia Transfer of Care Note  Patient: [name]  Procedure(s) Performed: EXPLORATION OF RIGHT NECK INCISION (Right ) REDO CAROTID ENDARTERECTOMY (Right )  Patient Location: PACU  Anesthesia Type:General  Level of Consciousness: awake and alert   Airway & Oxygen Therapy: Patient Spontanous Breathing and Patient connected to face mask oxygen  Post-op Assessment: Report given to RN, Post -op Vital signs reviewed and stable and Patient moving all extremities X 4  Post vital signs: Reviewed and stable  Last Vitals:  Vitals Value Taken Time  BP 135/88 06/26/20 1325  Temp 36.4 C 06/26/20 1325  Pulse 99 06/26/20 1329  Resp 18 06/26/20 1329  SpO2 100 % 06/26/20 1329  Vitals shown include unvalidated device data.  Last Pain:  Vitals:   06/26/20 1325  TempSrc:   PainSc: 0-No pain         Complications: No complications documented.

## 2020-06-27 ENCOUNTER — Encounter (HOSPITAL_COMMUNITY): Payer: Self-pay | Admitting: Vascular Surgery

## 2020-06-27 LAB — GLUCOSE, CAPILLARY
Glucose-Capillary: 160 mg/dL — ABNORMAL HIGH (ref 70–99)
Glucose-Capillary: 222 mg/dL — ABNORMAL HIGH (ref 70–99)

## 2020-06-27 LAB — BASIC METABOLIC PANEL
Anion gap: 9 (ref 5–15)
BUN: 27 mg/dL — ABNORMAL HIGH (ref 8–23)
CO2: 24 mmol/L (ref 22–32)
Calcium: 8.6 mg/dL — ABNORMAL LOW (ref 8.9–10.3)
Chloride: 106 mmol/L (ref 98–111)
Creatinine, Ser: 2.02 mg/dL — ABNORMAL HIGH (ref 0.61–1.24)
GFR calc Af Amer: 33 mL/min — ABNORMAL LOW (ref >60)
GFR calc non Af Amer: 29 mL/min — ABNORMAL LOW (ref >60)
Glucose, Bld: 191 mg/dL — ABNORMAL HIGH (ref 70–99)
Potassium: 4.2 mmol/L (ref 3.5–5.1)
Sodium: 139 mmol/L (ref 135–145)

## 2020-06-27 LAB — CBC
HCT: 32.2 % — ABNORMAL LOW (ref 39.0–52.0)
Hemoglobin: 10.3 g/dL — ABNORMAL LOW (ref 13.0–17.0)
MCH: 31.1 pg (ref 26.0–34.0)
MCHC: 32 g/dL (ref 30.0–36.0)
MCV: 97.3 fL (ref 80.0–100.0)
Platelets: 148 10*3/uL — ABNORMAL LOW (ref 150–400)
RBC: 3.31 MIL/uL — ABNORMAL LOW (ref 4.22–5.81)
RDW: 14 % (ref 11.5–15.5)
WBC: 10.5 10*3/uL (ref 4.0–10.5)
nRBC: 0 % (ref 0.0–0.2)

## 2020-06-27 LAB — SURGICAL PATHOLOGY

## 2020-06-27 MED ORDER — ASPIRIN 81 MG PO TBEC
81.0000 mg | DELAYED_RELEASE_TABLET | Freq: Every day | ORAL | 11 refills | Status: DC
Start: 2020-06-28 — End: 2020-09-08

## 2020-06-27 NOTE — Progress Notes (Signed)
Pt discharged home with wife and children. IV and telemetry box removed. Pt and pt's wife received discharge instructions and all questions were answered. Pt left with all of his belongings. Pt discharged via wheelchair and was accompanied by this RN.

## 2020-06-27 NOTE — Progress Notes (Addendum)
[facility] [place]  Subjective  - Doing well without complaint   Objective 115/87 (!) 105 98.4 F (36.9 C) (Oral) 19 99%  Intake/Output Summary (Last 24 hours) at 06/27/2020 0723 Last data filed at 06/27/2020 0641 Gross per 24 hour  Intake 1676.23 ml  Output 1559 ml  Net 117.23 ml    Moving all 4ext, grip 5/5 B Ue No tongue deviation, no facial droop Right neck incision without hematoma, drain in place to suction 59 cc OP since surgery. Lungs non labored breathing  Assessment/Planning: POD # 1 re do right CEA with distal GSV interposition bypass  No complaints.  Will likely d/c drain pending MD discussion.  No neurologic deficits. He has not ambulated yet.  Tol PO's well and pain controlled. Possible discharge home today.  Slight increase in Cr with history of CKD.  Urine OP good 1000 cc last 24 hours.    Cultures pending no growth to date.  Discharge on antibiotics?  [name] 06/27/2020 7:23 AM --  Laboratory Lab Results: Recent Labs    06/27/20 0434  WBC 10.5  HGB 10.3*  HCT 32.2*  PLT 148*   BMET Recent Labs    06/27/20 0434  NA 139  K 4.2  CL 106  CO2 24  GLUCOSE 191*  BUN 27*  CREATININE 2.02*  CALCIUM 8.6*    COAG Lab Results  Component Value Date   INR 1.0 06/19/2020   INR 0.92 02/29/2012   INR 1.0 09/07/2007   No results found for: PTT  I agree with the above.  POD#1, s/p redo right carotid endarterectomy.  Neuro intact.  Incision c/d/i.  Will remove drain and d/c home today  [name]

## 2020-06-27 NOTE — Progress Notes (Signed)
Pt ambulated 440ft in hallway with standby assist. Tolerated well.

## 2020-06-28 LAB — AEROBIC/ANAEROBIC CULTURE W GRAM STAIN (SURGICAL/DEEP WOUND): Gram Stain: NONE SEEN

## 2020-07-01 LAB — AEROBIC/ANAEROBIC CULTURE W GRAM STAIN (SURGICAL/DEEP WOUND): Gram Stain: NONE SEEN

## 2020-07-01 NOTE — Discharge Summary (Addendum)
[facility] Discharge Summary   Patient ID:  [name] MRN: [id number] DOB/AGE: [birth year] 84 y.o.  Admit date: 06/26/2020 Discharge date: 06/27/20 Date of Surgery: 9/15/202 Surgeon: Juliann Mule): Early, Arvilla Meres, MD  Admission Diagnosis: Infection of skin of neck [L08.9]  Discharge Diagnoses:  Infection of skin of neck [L08.9]  Secondary Diagnoses: Past Medical History:  Diagnosis Date  . Arthritis    knee  . Carotid artery occlusion   . Diabetes mellitus    metformin,januvia,and glipizide daily  . Enlarged prostate    pt states no trouble with it  . Hyperlipidemia    takes Simvastatin daily  . Hypertension    takes Atenolol and Lisinopril daily  . Impaired hearing, left    but doesn't wear hearing aids  . Seasonal allergies     Procedure(s): EXPLORATION OF RIGHT NECK INCISION REDO CAROTID ENDARTERECTOMY  Discharged Condition: stable  HPI: 86 y/o male with history of right CEA  in 2008.  He had recurrent stenosis and underwent redo endarterectomy in 2013.  His main complaint is of lack of energy and feeling as though he sleeps a great deal of the time.  He has a persistent draining sinus tract that is getting progressing and becoming larger.  He was scheduled for exploration and possible re-do right CEA.  He is stage III CKD.   Hospital Course:  [name] is a 86 y.o. male is S/P  Procedure(s): EXPLORATION OF RIGHT NECK INCISION REDO CAROTID ENDARTERECTOMY A JP drain was placed post-op and d/c'd post op day 1.  He is comfortable and not requiring po pain medication.  He is ambulatory, no neuro deficits on exam. Incision healing well.  Cultures pending from sinus tract no growth to date.  He was discharged in stable condition.  Consults:  Treatment Team:  Serafina Mitchell, MD  Significant Diagnostic Studies: CBC Lab Results  Component Value Date   WBC 10.5 06/27/2020   HGB 10.3 (L) 06/27/2020   HCT 32.2 (L) 06/27/2020    MCV 97.3 06/27/2020   PLT 148 (L) 06/27/2020    BMET    Component Value Date/Time   NA 139 06/27/2020 0434   K 4.2 06/27/2020 0434   CL 106 06/27/2020 0434   CO2 24 06/27/2020 0434   GLUCOSE 191 (H) 06/27/2020 0434   BUN 27 (H) 06/27/2020 0434   CREATININE 2.02 (H) 06/27/2020 0434   CREATININE 1.81 (H) 06/04/2020 1535   CALCIUM 8.6 (L) 06/27/2020 0434   GFRNONAA 29 (L) 06/27/2020 0434   GFRNONAA 33 (L) 06/04/2020 1535   GFRAA 33 (L) 06/27/2020 0434   GFRAA 38 (L) 06/04/2020 1535   COAG Lab Results  Component Value Date   INR 1.0 06/19/2020   INR 0.92 02/29/2012   INR 1.0 09/07/2007     Disposition:  Discharge to :Home Discharge Instructions    Call MD for:  redness, tenderness, or signs of infection (pain, swelling, bleeding, redness, odor or green/yellow discharge around incision site)   Complete by: As directed    Call MD for:  severe or increased pain, loss or decreased feeling  in affected limb(s)   Complete by: As directed    Call MD for:  temperature >100.5   Complete by: As directed    Resume previous diet   Complete by: As directed      Allergies as of 06/27/2020   No Known Allergies     Medication List    TAKE these medications  aspirin EC 81 MG tablet Take 81 mg by mouth daily. What changed: Another medication with the same name was added. Make sure you understand how and when to take each.   aspirin 81 MG EC tablet Take 1 tablet (81 mg total) by mouth daily. Swallow whole. What changed: You were already taking a medication with the same name, and this prescription was added. Make sure you understand how and when to take each.   atenolol 50 MG tablet Commonly known as: TENORMIN Take 1 tablet (50 mg total) by mouth daily.   BLOOD GLUCOSE TEST STRIPS Strp Please dispense as One touch Ultra Blue. Use as directed to monitor FSBS 3x daily. Dx: E11.9.   furosemide 20 MG tablet Commonly known as: LASIX TAKE 1 TABLET BY MOUTH EVERY DAY    glipiZIDE 10 MG 24 hr tablet Commonly known as: GLUCOTROL XL Take 1 tablet (10 mg total) by mouth daily with breakfast.   glipiZIDE 10 MG 24 hr tablet Commonly known as: GLUCOTROL XL TAKE 1 TABLET BY MOUTH EVERY DAY WITH BREAKFAST   metFORMIN 1000 MG tablet Commonly known as: GLUCOPHAGE TAKE 1 TABLET TWICE A DAY WITH A MEAL   pioglitazone 30 MG tablet Commonly known as: ACTOS TAKE 1 TABLET BY MOUTH EVERY DAY   Trulicity 9.38 HW/2.9HB Sopn Generic drug: Dulaglutide INJECT 0.75 MG INTO THE SKIN ONCE A WEEK.      Verbal and written Discharge instructions given to the patient. Wound care per Discharge AVS  Follow-up Information    Early, Arvilla Meres, MD Follow up in 2 week(s).   Specialties: Vascular Surgery, Cardiology Why: office will call Contact information: [address] [place] [phone]               Signed: Roxy Horseman 07/01/2020, 11:06 AM --- For VQI Registry use --- Instructions: Press F2 to tab through selections.  Delete question if not applicable.   Modified Rankin score at D/C (0-6): Rankin Score=0  IV medication needed for:  1. Hypertension: No 2. Hypotension: No  Post-op Complications: No  1. Post-op CVA or TIA: No  If yes: Event classification (right eye, left eye, right cortical, left cortical, verterobasilar, other):   If yes: Timing of event (intra-op, <6 hrs post-op, >=6 hrs post-op, unknown):   2. CN injury: No  If yes: CN  injuried   3. Myocardial infarction: No  If yes: Dx by (EKG or clinical, Troponin):   4.  CHF: No  5.  Dysrhythmia (new): No  6. Wound infection: questionable  7. Reperfusion symptoms: No  8. Return to OR: No  If yes: return to OR for (bleeding, neurologic, other CEA incision, other):   Discharge medications: Statin use:  No  for medical reason   ASA use:  Yes Beta blocker use:  Yes ACE-Inhibitor use:  No  for medical reason   P2Y12 Antagonist use: x[ ]  None, [ ]  Plavix, [ ]   Plasugrel, [ ]  Ticlopinine, [ ]  Ticagrelor, [ ]  Other, [ ]  No for medical reason, [ ]  Non-compliant, [ ]  Not-indicated Anti-coagulant use:  [x ] None, [ ]  Warfarin, [ ]  Rivaroxaban, [ ]  Dabigatran, [ ]  Other, [ ]  No for medical reason, [ ]  Non-compliant, [ ]  Not-indicated

## 2020-07-12 ENCOUNTER — Other Ambulatory Visit: Payer: Self-pay

## 2020-07-12 ENCOUNTER — Ambulatory Visit (INDEPENDENT_AMBULATORY_CARE_PROVIDER_SITE_OTHER): Payer: Self-pay | Admitting: Vascular Surgery

## 2020-07-12 ENCOUNTER — Encounter: Payer: Self-pay | Admitting: Vascular Surgery

## 2020-07-12 VITALS — BP 153/83 | HR 70 | Temp 98.0°F | Resp 20 | Ht 65.0 in | Wt 177.6 lb

## 2020-07-12 DIAGNOSIS — Z9889 Other specified postprocedural states: Secondary | ICD-10-CM

## 2020-07-12 NOTE — Progress Notes (Signed)
    Subjective:     Patient ID: [name], male   DOB: 1930-12-17, 86 y.o.   MRN: [id number]  HPI 86 year old male status post right carotid interposition graft for draining sinus after endarterectomy.  This was done with left ankle saphenous vein.  Patient is healed up well.  He has no complaints.  Review of Systems No complaints today    Objective:   Physical Exam Vitals:   07/12/20 0817 07/12/20 0819  BP: (!) 169/81 (!) 153/83  Pulse: 70   Resp: 20   Temp: 98 F (36.7 C)   SpO2: 94%    Awake alert oriented Nonlabored respirations Right neck incision healing well Left ankle incision healing well Neuro intact    Assessment:     87 year old male status post interposition graft left carotid for draining sinus of previous patch angioplasty.    Plan:     Follow-up in 9 months with repeat carotid duplex.    [name] C. Donzetta Matters, MD [facility] [place] Office: [phone] Pager: [phone]

## 2020-07-16 ENCOUNTER — Other Ambulatory Visit: Payer: Self-pay | Admitting: *Deleted

## 2020-07-16 DIAGNOSIS — I6523 Occlusion and stenosis of bilateral carotid arteries: Secondary | ICD-10-CM

## 2020-07-16 DIAGNOSIS — Z9889 Other specified postprocedural states: Secondary | ICD-10-CM

## 2020-08-12 DIAGNOSIS — Z8616 Personal history of COVID-19: Secondary | ICD-10-CM

## 2020-08-12 HISTORY — DX: Personal history of COVID-19: Z86.16

## 2020-08-30 ENCOUNTER — Other Ambulatory Visit: Payer: Self-pay | Admitting: Family Medicine

## 2020-09-05 ENCOUNTER — Emergency Department (HOSPITAL_COMMUNITY): Payer: PPO

## 2020-09-05 ENCOUNTER — Inpatient Hospital Stay (HOSPITAL_COMMUNITY)
Admission: EM | Admit: 2020-09-05 | Discharge: 2020-09-08 | DRG: 177 | Disposition: A | Discharge status: Home Health | Payer: PPO | Attending: Internal Medicine | Admitting: Internal Medicine

## 2020-09-05 DIAGNOSIS — N401 Enlarged prostate with lower urinary tract symptoms: Secondary | ICD-10-CM | POA: Diagnosis present

## 2020-09-05 DIAGNOSIS — I10 Essential (primary) hypertension: Secondary | ICD-10-CM | POA: Diagnosis present

## 2020-09-05 DIAGNOSIS — Z91048 Other nonmedicinal substance allergy status: Secondary | ICD-10-CM

## 2020-09-05 DIAGNOSIS — Z7984 Long term (current) use of oral hypoglycemic drugs: Secondary | ICD-10-CM | POA: Diagnosis not present

## 2020-09-05 DIAGNOSIS — U071 COVID-19: Secondary | ICD-10-CM | POA: Diagnosis present

## 2020-09-05 DIAGNOSIS — Z79899 Other long term (current) drug therapy: Secondary | ICD-10-CM

## 2020-09-05 DIAGNOSIS — J1282 Pneumonia due to coronavirus disease 2019: Secondary | ICD-10-CM | POA: Diagnosis present

## 2020-09-05 DIAGNOSIS — I2510 Atherosclerotic heart disease of native coronary artery without angina pectoris: Secondary | ICD-10-CM | POA: Diagnosis present

## 2020-09-05 DIAGNOSIS — D6959 Other secondary thrombocytopenia: Secondary | ICD-10-CM | POA: Diagnosis present

## 2020-09-05 DIAGNOSIS — K5909 Other constipation: Secondary | ICD-10-CM | POA: Diagnosis present

## 2020-09-05 DIAGNOSIS — R350 Frequency of micturition: Secondary | ICD-10-CM | POA: Diagnosis present

## 2020-09-05 DIAGNOSIS — J9601 Acute respiratory failure with hypoxia: Secondary | ICD-10-CM | POA: Diagnosis present

## 2020-09-05 DIAGNOSIS — M1990 Unspecified osteoarthritis, unspecified site: Secondary | ICD-10-CM | POA: Diagnosis present

## 2020-09-05 DIAGNOSIS — J9600 Acute respiratory failure, unspecified whether with hypoxia or hypercapnia: Secondary | ICD-10-CM | POA: Diagnosis not present

## 2020-09-05 DIAGNOSIS — R062 Wheezing: Secondary | ICD-10-CM | POA: Diagnosis not present

## 2020-09-05 DIAGNOSIS — N179 Acute kidney failure, unspecified: Secondary | ICD-10-CM | POA: Diagnosis present

## 2020-09-05 DIAGNOSIS — Z7982 Long term (current) use of aspirin: Secondary | ICD-10-CM

## 2020-09-05 DIAGNOSIS — R5383 Other fatigue: Secondary | ICD-10-CM | POA: Diagnosis not present

## 2020-09-05 DIAGNOSIS — J189 Pneumonia, unspecified organism: Secondary | ICD-10-CM | POA: Diagnosis not present

## 2020-09-05 DIAGNOSIS — E785 Hyperlipidemia, unspecified: Secondary | ICD-10-CM | POA: Diagnosis present

## 2020-09-05 DIAGNOSIS — IMO0002 Reserved for concepts with insufficient information to code with codable children: Secondary | ICD-10-CM | POA: Diagnosis present

## 2020-09-05 DIAGNOSIS — E1165 Type 2 diabetes mellitus with hyperglycemia: Secondary | ICD-10-CM | POA: Diagnosis present

## 2020-09-05 DIAGNOSIS — N289 Disorder of kidney and ureter, unspecified: Secondary | ICD-10-CM

## 2020-09-05 DIAGNOSIS — H9190 Unspecified hearing loss, unspecified ear: Secondary | ICD-10-CM

## 2020-09-05 DIAGNOSIS — Z806 Family history of leukemia: Secondary | ICD-10-CM

## 2020-09-05 DIAGNOSIS — E1122 Type 2 diabetes mellitus with diabetic chronic kidney disease: Secondary | ICD-10-CM | POA: Diagnosis present

## 2020-09-05 DIAGNOSIS — Z825 Family history of asthma and other chronic lower respiratory diseases: Secondary | ICD-10-CM | POA: Diagnosis not present

## 2020-09-05 DIAGNOSIS — R059 Cough, unspecified: Secondary | ICD-10-CM | POA: Diagnosis not present

## 2020-09-05 DIAGNOSIS — Z87891 Personal history of nicotine dependence: Secondary | ICD-10-CM

## 2020-09-05 DIAGNOSIS — Z9889 Other specified postprocedural states: Secondary | ICD-10-CM

## 2020-09-05 LAB — CBC
HCT: 44.3 % (ref 39.0–52.0)
HCT: 42.7 % (ref 39.0–52.0)
Hemoglobin: 14.2 g/dL (ref 13.0–17.0)
Hemoglobin: 14 g/dL (ref 13.0–17.0)
MCH: 30.3 pg (ref 26.0–34.0)
MCH: 30.4 pg (ref 26.0–34.0)
MCHC: 32.1 g/dL (ref 30.0–36.0)
MCHC: 32.8 g/dL (ref 30.0–36.0)
MCV: 94.5 fL (ref 80.0–100.0)
MCV: 92.6 fL (ref 80.0–100.0)
Platelets: 123 10*3/uL — ABNORMAL LOW (ref 150–400)
Platelets: 115 10*3/uL — ABNORMAL LOW (ref 150–400)
RBC: 4.69 MIL/uL (ref 4.22–5.81)
RBC: 4.61 MIL/uL (ref 4.22–5.81)
RDW: 13.2 % (ref 11.5–15.5)
RDW: 13.2 % (ref 11.5–15.5)
WBC: 6.1 10*3/uL (ref 4.0–10.5)
WBC: 3.3 10*3/uL — ABNORMAL LOW (ref 4.0–10.5)
nRBC: 0 % (ref 0.0–0.2)
nRBC: 0 % (ref 0.0–0.2)

## 2020-09-05 LAB — RESP PANEL BY RT-PCR (FLU A&B, COVID) ARPGX2
Influenza A by PCR: NEGATIVE
Influenza B by PCR: NEGATIVE
SARS Coronavirus 2 by RT PCR: POSITIVE — AB

## 2020-09-05 LAB — URINALYSIS, ROUTINE W REFLEX MICROSCOPIC
Bilirubin Urine: NEGATIVE
Glucose, UA: 150 mg/dL — AB
Ketones, ur: NEGATIVE mg/dL
Leukocytes,Ua: NEGATIVE
Nitrite: NEGATIVE
Protein, ur: 100 mg/dL — AB
Specific Gravity, Urine: 1.018 (ref 1.005–1.030)
pH: 5 (ref 5.0–8.0)

## 2020-09-05 LAB — LACTIC ACID, PLASMA: Lactic Acid, Venous: 1.3 mmol/L (ref 0.5–1.9)

## 2020-09-05 LAB — BASIC METABOLIC PANEL
Anion gap: 11 (ref 5–15)
BUN: 28 mg/dL — ABNORMAL HIGH (ref 8–23)
CO2: 23 mmol/L (ref 22–32)
Calcium: 8.5 mg/dL — ABNORMAL LOW (ref 8.9–10.3)
Chloride: 98 mmol/L (ref 98–111)
Creatinine, Ser: 1.7 mg/dL — ABNORMAL HIGH (ref 0.61–1.24)
GFR, Estimated: 39 mL/min — ABNORMAL LOW (ref >60)
Glucose, Bld: 199 mg/dL — ABNORMAL HIGH (ref 70–99)
Potassium: 4.1 mmol/L (ref 3.5–5.1)
Sodium: 132 mmol/L — ABNORMAL LOW (ref 135–145)

## 2020-09-05 LAB — FERRITIN: Ferritin: 132 ng/mL (ref 24–336)

## 2020-09-05 LAB — D-DIMER, QUANTITATIVE: D-Dimer, Quant: 1.2 ug/mL-FEU — ABNORMAL HIGH (ref 0.00–0.50)

## 2020-09-05 LAB — GLUCOSE, CAPILLARY: Glucose-Capillary: 225 mg/dL — ABNORMAL HIGH (ref 70–99)

## 2020-09-05 LAB — LACTATE DEHYDROGENASE: LDH: 236 U/L — ABNORMAL HIGH (ref 98–192)

## 2020-09-05 LAB — FIBRINOGEN: Fibrinogen: 442 mg/dL (ref 210–475)

## 2020-09-05 LAB — C-REACTIVE PROTEIN: CRP: 6.3 mg/dL — ABNORMAL HIGH (ref <1.0)

## 2020-09-05 LAB — TRIGLYCERIDES: Triglycerides: 102 mg/dL (ref <150)

## 2020-09-05 LAB — PROCALCITONIN: Procalcitonin: <0.1 ng/mL

## 2020-09-05 MED ORDER — ENOXAPARIN SODIUM 40 MG/0.4ML ~~LOC~~ SOLN
40.0000 mg | SUBCUTANEOUS | Status: DC
  Administered 2020-09-05 – 2020-09-06 (×2): 40 mg via SUBCUTANEOUS
  Filled 2020-09-05 (×2): qty 0.4

## 2020-09-05 MED ORDER — ALBUTEROL SULFATE HFA 108 (90 BASE) MCG/ACT IN AERS
2.0000 | INHALATION_SPRAY | RESPIRATORY_TRACT | Status: DC | PRN
  Filled 2020-09-05: qty 6.7

## 2020-09-05 MED ORDER — INSULIN ASPART 100 UNIT/ML ~~LOC~~ SOLN
0.0000 [IU] | Freq: Three times a day (TID) | SUBCUTANEOUS | Status: DC
  Administered 2020-09-06: 4 [IU] via SUBCUTANEOUS
  Administered 2020-09-06 (×2): 7 [IU] via SUBCUTANEOUS
  Administered 2020-09-07: 4 [IU] via SUBCUTANEOUS
  Administered 2020-09-07: 3 [IU] via SUBCUTANEOUS
  Administered 2020-09-07: 7 [IU] via SUBCUTANEOUS

## 2020-09-05 MED ORDER — IPRATROPIUM-ALBUTEROL 20-100 MCG/ACT IN AERS
1.0000 | INHALATION_SPRAY | Freq: Four times a day (QID) | RESPIRATORY_TRACT | Status: DC
  Administered 2020-09-06 – 2020-09-08 (×9): 1 via RESPIRATORY_TRACT
  Filled 2020-09-05: qty 4

## 2020-09-05 MED ORDER — SODIUM CHLORIDE 0.9 % IV SOLN: INTRAVENOUS | Status: DC

## 2020-09-05 MED ORDER — ALBUTEROL (5 MG/ML) CONTINUOUS INHALATION SOLN: 10.0000 mg/h | INHALATION_SOLUTION | Freq: Once | RESPIRATORY_TRACT | Status: DC

## 2020-09-05 MED ORDER — INSULIN ASPART 100 UNIT/ML ~~LOC~~ SOLN
0.0000 [IU] | Freq: Every day | SUBCUTANEOUS | Status: DC
  Administered 2020-09-05 – 2020-09-07 (×2): 2 [IU] via SUBCUTANEOUS

## 2020-09-05 MED ORDER — SODIUM CHLORIDE 0.9 % IV SOLN: 100.0000 mg | Freq: Every day | INTRAVENOUS | Status: DC

## 2020-09-05 MED ORDER — INSULIN DETEMIR 100 UNIT/ML ~~LOC~~ SOLN
0.1500 [IU]/kg | Freq: Two times a day (BID) | SUBCUTANEOUS | Status: DC
  Administered 2020-09-05 – 2020-09-08 (×6): 12 [IU] via SUBCUTANEOUS
  Filled 2020-09-05 (×7): qty 0.12

## 2020-09-05 MED ORDER — HYDROCOD POLST-CPM POLST ER 10-8 MG/5ML PO SUER: 5.0000 mL | Freq: Two times a day (BID) | ORAL | Status: DC | PRN

## 2020-09-05 MED ORDER — LINAGLIPTIN 5 MG PO TABS
5.0000 mg | ORAL_TABLET | Freq: Every day | ORAL | Status: DC
  Administered 2020-09-06 – 2020-09-08 (×3): 5 mg via ORAL
  Filled 2020-09-05 (×3): qty 1

## 2020-09-05 MED ORDER — BARICITINIB 2 MG PO TABS
2.0000 mg | ORAL_TABLET | Freq: Every day | ORAL | Status: DC
  Administered 2020-09-06 – 2020-09-07 (×2): 2 mg via ORAL
  Filled 2020-09-05 (×2): qty 1

## 2020-09-05 MED ORDER — SODIUM CHLORIDE 0.9 % IV SOLN: 200.0000 mg | Freq: Once | INTRAVENOUS | Status: DC

## 2020-09-05 MED ORDER — ONDANSETRON HCL 4 MG PO TABS: 4.0000 mg | ORAL_TABLET | Freq: Four times a day (QID) | ORAL | Status: DC | PRN

## 2020-09-05 MED ORDER — GUAIFENESIN-DM 100-10 MG/5ML PO SYRP: 10.0000 mL | ORAL_SOLUTION | ORAL | Status: DC | PRN

## 2020-09-05 MED ORDER — SODIUM CHLORIDE 0.9 % IV SOLN
200.0000 mg | Freq: Once | INTRAVENOUS | Status: AC
  Administered 2020-09-05: 200 mg via INTRAVENOUS
  Filled 2020-09-05: qty 40

## 2020-09-05 MED ORDER — METHYLPREDNISOLONE SODIUM SUCC 125 MG IJ SOLR
125.0000 mg | Freq: Once | INTRAMUSCULAR | Status: AC
  Administered 2020-09-05: 125 mg via INTRAVENOUS
  Filled 2020-09-05: qty 2

## 2020-09-05 MED ORDER — SODIUM CHLORIDE 0.9 % IV SOLN: Freq: Once | INTRAVENOUS | Status: AC

## 2020-09-05 MED ORDER — ONDANSETRON HCL 4 MG/2ML IJ SOLN: 4.0000 mg | Freq: Four times a day (QID) | INTRAMUSCULAR | Status: DC | PRN

## 2020-09-05 MED ORDER — DEXAMETHASONE 6 MG PO TABS
6.0000 mg | ORAL_TABLET | ORAL | Status: DC
  Administered 2020-09-06 – 2020-09-08 (×3): 6 mg via ORAL
  Filled 2020-09-05 (×3): qty 1

## 2020-09-05 MED ORDER — DEXAMETHASONE SODIUM PHOSPHATE 10 MG/ML IJ SOLN
10.0000 mg | Freq: Once | INTRAMUSCULAR | Status: AC
  Administered 2020-09-05: 10 mg via INTRAVENOUS
  Filled 2020-09-05: qty 1

## 2020-09-05 MED ORDER — SODIUM CHLORIDE 0.9 % IV SOLN
100.0000 mg | INTRAVENOUS | Status: DC
  Administered 2020-09-06 – 2020-09-08 (×3): 100 mg via INTRAVENOUS
  Filled 2020-09-05 (×4): qty 20

## 2020-09-05 NOTE — ED Notes (Signed)
Attempted to call report, receiving RN busy, left number with unit to return my call.

## 2020-09-05 NOTE — ED Notes (Addendum)
SpO2 noted to be in high 80s, upon RN entering room, Benedict noted to be disconnected from O2, same reattached. Patient resting peacefully with eyes closed, respirations even and nonlabored. NAD noted. Awaiting transport.

## 2020-09-05 NOTE — ED Triage Notes (Signed)
Pt here pov with reports of wheezing, cough, fatigue and decreased appetite. Pt denies CP or feeling shob. Pt in NAD.

## 2020-09-05 NOTE — ED Provider Notes (Signed)
[facility] EMERGENCY DEPARTMENT Provider Note   CSN: [id number] Arrival date & time: 09/05/20  1400     History Chief Complaint  Patient presents with   Shortness of Breath   Fatigue    [name] is a 86 y.o. male with a history of hypertension, diabetes type 2, and arteriosclerotic cardiovascular disease. Patient is accompanied by his daughter Judyann Munson. Patient's chief complaint is decreased strength, decreased appetite, labored breathing and nonproductive cough. Patient's symptoms started a week and a half ago and have been getting progressively worse. Nothing makes the patient symptoms better or worse. Per patient's daughter he is at his baseline mental status she is alert to person and place. She also states patient has needed help with activities of daily living over the last week and a half which is abnormal for him. Patient had a temperature of 100F temporally yesterday. Patient endorses nasal congestion, rhinorrhea, constipation and frequent urination.     HPI     Past Medical History:  Diagnosis Date   Arthritis    knee   Carotid artery occlusion    Diabetes mellitus    metformin,januvia,and glipizide daily   Enlarged prostate    pt states no trouble with it   Hyperlipidemia    takes Simvastatin daily   Hypertension    takes Atenolol and Lisinopril daily   Impaired hearing, left    but doesn't wear hearing aids   Seasonal allergies     Patient Active Problem List   Diagnosis Date Noted   Acute respiratory failure due to COVID-19 [facility]) 09/05/2020   AKI (acute kidney injury) ([place]) 09/05/2020   Hard of hearing 09/05/2020   Infection of skin of neck 06/26/2020   Aftercare following surgery of the circulatory system, NEC 05/15/2014   Occlusion and stenosis of carotid artery without mention of cerebral infarction 02/23/2012   HTN (hypertension) 01/13/2011   Diabetes mellitus type II, uncontrolled ([place]) 01/13/2011     BPH (benign prostatic hyperplasia) 01/13/2011   ASCVD (arteriosclerotic cardiovascular disease) 01/13/2011   History of CEA (carotid endarterectomy) 01/13/2011   Tobacco abuse, 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    Past Surgical History:  Procedure Laterality Date   APPENDECTOMY     as a child   CAROTID ANGIOGRAM N/A 05/09/2013   Procedure: CAROTID ANGIOGRAM;  Surgeon: Serafina Mitchell, MD;  Location: [facility] CATH LAB;  Service: Cardiovascular;  Laterality: N/A;   CAROTID ENDARTERECTOMY Right 2008    Re-do Mar 04, 2012   carotidendartectomy  2008   right side   ENDARTERECTOMY  03/04/2012   Procedure: ENDARTERECTOMY CAROTID;  Surgeon: Rosetta Posner, MD;  Location: [facility] OR;  Service: Vascular;  Laterality: Right;  Redo Right Carotid Endarterectomy with hemasheild patch angioplasty   ENDARTERECTOMY Right 06/26/2020   Procedure: REDO CAROTID ENDARTERECTOMY;  Surgeon: Rosetta Posner, MD;  Location: [facility] OR;  Service: Vascular;  Laterality: Right;   WOUND EXPLORATION Right 06/26/2020   Procedure: EXPLORATION OF RIGHT NECK INCISION;  Surgeon: Rosetta Posner, MD;  Location: [facility] OR;  Service: Vascular;  Laterality: Right;       Family History  Problem Relation Age of Onset   Cancer Mother        Leukemia   Leukemia Mother    Asthma Father    Anesthesia problems Neg Hx    Hypotension Neg Hx    Malignant hyperthermia Neg Hx    Pseudochol deficiency Neg Hx     Social History   Tobacco Use  Smoking status: Former Smoker    Years: 25.00    Types: Cigarettes    Quit date: 10/13/1991    Years since quitting: 28.9   Smokeless tobacco: Never Used  Vaping Use   Vaping Use: Never used  Substance Use Topics   Alcohol use: No    Comment: occassional glass of wine   Drug use: No    Home Medications Prior to Admission medications   Medication Sig Start Date End Date Taking? Authorizing Provider  atenolol (TENORMIN) 50 MG tablet Take 1 tablet (50 mg total) by mouth daily. 05/31/20  Yes  Susy Frizzle, MD  furosemide (LASIX) 20 MG tablet TAKE 1 TABLET BY MOUTH EVERY DAY Patient taking differently: Take 20 mg by mouth daily.  06/30/19  Yes Susy Frizzle, MD  glipiZIDE (GLUCOTROL XL) 10 MG 24 hr tablet Take 1 tablet (10 mg total) by mouth daily with breakfast. 06/04/20  Yes Susy Frizzle, MD  pioglitazone (ACTOS) 30 MG tablet TAKE 1 TABLET BY MOUTH EVERY DAY Patient taking differently: Take 30 mg by mouth daily.  08/30/20  Yes Susy Frizzle, MD  TRULICITY 4.28 JG/8.1LX SOPN INJECT 0.75 MG INTO THE SKIN ONCE A WEEK. Patient taking differently: Inject 0.75 mg into the skin every Sunday.  02/01/20  Yes Susy Frizzle, MD  aspirin EC 81 MG EC tablet Take 1 tablet (81 mg total) by mouth daily. Swallow whole. Patient not taking: Reported on 09/05/2020 06/28/20   Ulyses Amor, PA-C  glipiZIDE (GLUCOTROL XL) 10 MG 24 hr tablet TAKE 1 TABLET BY MOUTH EVERY DAY WITH BREAKFAST Patient not taking: Reported on 09/05/2020 06/04/20   Susy Frizzle, MD  Glucose Blood (BLOOD GLUCOSE TEST STRIPS) STRP Please dispense as One touch Ultra Blue. Use as directed to monitor FSBS 3x daily. Dx: E11.9. 02/07/19   Susy Frizzle, MD  metFORMIN (GLUCOPHAGE) 1000 MG tablet TAKE 1 TABLET TWICE A DAY WITH A MEAL Patient not taking: Reported on 09/05/2020 06/13/20   Susy Frizzle, MD    Allergies    Tape  Review of Systems   Review of Systems  Constitutional: Positive for appetite change (decreased), fatigue and fever. Negative for chills.  HENT: Positive for congestion and rhinorrhea. Negative for sore throat.   Respiratory: Positive for cough (nonproductive). Negative for shortness of breath.   Cardiovascular: Negative for chest pain, palpitations and leg swelling.  Gastrointestinal: Positive for constipation. Negative for abdominal distention, abdominal pain, blood in stool, diarrhea, nausea and vomiting.  Endocrine: Positive for polyuria. Negative for polydipsia and polyphagia.    Genitourinary: Positive for frequency. Negative for dysuria.  Musculoskeletal: Negative for arthralgias and myalgias.  Skin: Negative for rash.  Neurological: Negative for dizziness, syncope, light-headedness, numbness and headaches.  Psychiatric/Behavioral: Negative for confusion.    Physical Exam Updated Vital Signs BP (!) 150/94 (BP Location: Right Arm)    Pulse 67    Temp 98 F (36.7 C) (Oral)    Resp 20    SpO2 96%   Physical Exam Constitutional:      General: He is not in acute distress.    Appearance: He is ill-appearing. He is not toxic-appearing.  HENT:     Head: Normocephalic and atraumatic.     Mouth/Throat:     Mouth: Mucous membranes are dry.     Tongue: Lesions (white raised lesions) present.     Pharynx: Oropharynx is clear.  Eyes:     Pupils: Pupils are equal, round, and reactive  to light.  Cardiovascular:     Rate and Rhythm: Normal rate and regular rhythm.  Pulmonary:     Breath sounds: No stridor. Examination of the right-upper field reveals wheezing. Examination of the left-upper field reveals wheezing. Examination of the right-middle field reveals wheezing. Examination of the left-middle field reveals wheezing. Examination of the right-lower field reveals wheezing. Examination of the left-lower field reveals wheezing. Wheezing present. No rhonchi or rales.  Abdominal:     General: A surgical scar is present. Bowel sounds are normal.     Palpations: Abdomen is soft.     Tenderness: There is no abdominal tenderness.     Hernia: No hernia is present.    Musculoskeletal:     Cervical back: Neck supple.     Right lower leg: No swelling. No edema.     Left lower leg: No swelling. No edema.  Skin:    General: Skin is warm and dry.     Coloration: Skin is not jaundiced or pale.  Neurological:     General: No focal deficit present.     Mental Status: He is alert. Mental status is at baseline.     Comments: Alert to person and place, baseline per pt's  daughter     ED Results / Procedures / Treatments   Labs (all labs ordered are listed, but only abnormal results are displayed) Labs Reviewed  RESP PANEL BY RT-PCR (FLU A&B, COVID) ARPGX2 - Abnormal; Notable for the following components:      Result Value   SARS Coronavirus 2 by RT PCR POSITIVE (*)    All other components within normal limits  BASIC METABOLIC PANEL - Abnormal; Notable for the following components:   Sodium 132 (*)    Glucose, Bld 199 (*)    BUN 28 (*)    Creatinine, Ser 1.70 (*)    Calcium 8.5 (*)    GFR, Estimated 39 (*)    All other components within normal limits  CBC - Abnormal; Notable for the following components:   Platelets 123 (*)    All other components within normal limits  URINALYSIS, ROUTINE W REFLEX MICROSCOPIC - Abnormal; Notable for the following components:   APPearance HAZY (*)    Glucose, UA 150 (*)    Hgb urine dipstick SMALL (*)    Protein, ur 100 (*)    Bacteria, UA RARE (*)    All other components within normal limits  D-DIMER, QUANTITATIVE (NOT AT [facility]) - Abnormal; Notable for the following components:   D-Dimer, Quant 1.20 (*)    All other components within normal limits  LACTATE DEHYDROGENASE - Abnormal; Notable for the following components:   LDH 236 (*)    All other components within normal limits  C-REACTIVE PROTEIN - Abnormal; Notable for the following components:   CRP 6.3 (*)    All other components within normal limits  GLUCOSE, CAPILLARY - Abnormal; Notable for the following components:   Glucose-Capillary 225 (*)    All other components within normal limits  CULTURE, BLOOD (ROUTINE X 2)  CULTURE, BLOOD (ROUTINE X 2)  LACTIC ACID, PLASMA  PROCALCITONIN  FERRITIN  TRIGLYCERIDES  FIBRINOGEN  CBC  CREATININE, SERUM  CBC WITH DIFFERENTIAL/PLATELET  COMPREHENSIVE METABOLIC PANEL  C-REACTIVE PROTEIN  D-DIMER, QUANTITATIVE (NOT AT [facility])  FERRITIN    EKG EKG Interpretation  Date/Time:  Thursday September 05 2020  14:01:16 EST Ventricular Rate:  98 PR Interval:  162 QRS Duration: 74 QT Interval:  322 QTC Calculation: 411  R Axis:   -15 Text Interpretation: Normal sinus rhythm Low voltage QRS Borderline ECG since last tracing no significant change Confirmed by Noemi Chapel [phone]) on 09/05/2020 5:24:49 PM   Radiology DG Chest 2 View  Result Date: 09/05/2020 CLINICAL DATA:  Wheezing with cough and fatigue. EXAM: CHEST - 2 VIEW COMPARISON:  02/29/2012 FINDINGS: Two-view exam shows patchy ground-glass opacity in both lungs with a mid and lower lung predominance. No pleural effusion. The cardiopericardial silhouette is within normal limits for size. The visualized bony structures of the thorax show no acute abnormality. IMPRESSION: Patchy bilateral ground-glass opacities, compatible with multifocal pneumonia. Electronically Signed   By: Misty Stanley M.D.   On: 09/05/2020 14:24    Procedures .Critical Care Performed by: Loni Beckwith, PA-C Authorized by: Loni Beckwith, PA-C   Critical care provider statement:    Critical care time (minutes):  45   Critical care time was exclusive of:  Separately billable procedures and treating other patients   Critical care was necessary to treat or prevent imminent or life-threatening deterioration of the following conditions:  Respiratory failure   Critical care was time spent personally by me on the following activities:  Discussions with consultants, evaluation of patient's response to treatment, examination of patient, ordering and performing treatments and interventions, ordering and review of laboratory studies, ordering and review of radiographic studies, pulse oximetry, re-evaluation of patient's condition, obtaining history from patient or surrogate and review of old charts   (including critical care time)  Medications Ordered in ED Medications  albuterol (VENTOLIN HFA) 108 (90 Base) MCG/ACT inhaler 2 puff (has no administration in time  range)  enoxaparin (LOVENOX) injection 40 mg (has no administration in time range)  0.9 %  sodium chloride infusion ( Intravenous New Bag/Given 09/05/20 2212)  baricitinib (OLUMIANT) tablet 2 mg (has no administration in time range)  Ipratropium-Albuterol (COMBIVENT) respimat 1 puff (has no administration in time range)  dexamethasone (DECADRON) tablet 6 mg (has no administration in time range)  guaiFENesin-dextromethorphan (ROBITUSSIN DM) 100-10 MG/5ML syrup 10 mL (has no administration in time range)  chlorpheniramine-HYDROcodone (TUSSIONEX) 10-8 MG/5ML suspension 5 mL (has no administration in time range)  ondansetron (ZOFRAN) tablet 4 mg (has no administration in time range)    Or  ondansetron (ZOFRAN) injection 4 mg (has no administration in time range)  insulin detemir (LEVEMIR) injection 0.15 Units/kg (has no administration in time range)  insulin aspart (novoLOG) injection 0-20 Units (has no administration in time range)  insulin aspart (novoLOG) injection 0-5 Units (has no administration in time range)  linagliptin (TRADJENTA) tablet 5 mg (has no administration in time range)  remdesivir 200 mg in sodium chloride 0.9% 250 mL IVPB (has no administration in time range)    Followed by  remdesivir 100 mg in sodium chloride 0.9 % 100 mL IVPB (has no administration in time range)  0.9 %  sodium chloride infusion ( Intravenous New Bag/Given 09/05/20 1832)  methylPREDNISolone sodium succinate (SOLU-MEDROL) 125 mg/2 mL injection 125 mg (125 mg Intravenous Given 09/05/20 1621)  dexamethasone (DECADRON) injection 10 mg (10 mg Intravenous Given 09/05/20 1832)    ED Course  I have reviewed the triage vital signs and the nursing notes.  Pertinent labs & imaging results that were available during my care of the patient were reviewed by me and considered in my medical decision making (see chart for details).    MDM Rules/Calculators/A&P  [name] was  evaluated in Emergency Department on 09/05/2020 for the symptoms described in the history of present illness. He was evaluated in the context of the global COVID-19 pandemic, which necessitated consideration that the patient might be at risk for infection with the SARS-CoV-2 virus that causes COVID-19. Institutional protocols and algorithms that pertain to the evaluation of patients at risk for COVID-19 are in a state of rapid change based on information released by regulatory bodies including the CDC and federal and state organizations. These policies and algorithms were followed during the patient's care in the ED.  Patient is alert, ill-appearing 86 year old man with a chief complaint of labored breathing, decreased strength, decreased appetite and nonproductive cough x1.5weeks.  Patient is a former smoker with quit date over 47 years prior.  Patient does not use any oxygen or nebulizer treatments at home.  Per patient's daughter he is at his baseline mental status.  Patient is a diabetic on Metformin, glipizide and Trulicity.  Per patient's daughter patient takes medications as prescribed.  Patient's oxygen saturation was found to be 88 on room air.  Physical exam showed diffuse expiratory wheezing in all lung fields, no acute distress and signs of oral thrush.  Chest x-ray shows patchy bilateral groundglass opacities.  Radiographs were independently reviewed by me and I agree with this impression.  CBC showed thrombocytopenia at 123 and no leukocytosis.  BMP shows BUN and creatinine improved from last test in September, glucose at 199 and no other metabolic derangements.    Patient received albuterol , a soluMedrol, and maintenance fluids.  UA ordered pending.  COVID-19 test was ordered and found to be positive.  Patient was given Decadron, placed on nasal cannula, COVID-19 order set ordered and admitted to the hospital service.  Patient and his daughter are amenable to this plan.   Final Clinical  Impression(s) / ED Diagnoses Final diagnoses:  Acute respiratory failure with hypoxia ([place])  COVID-19  Renal insufficiency    Rx / DC Orders ED Discharge Orders    None       Dyann Ruddle 09/05/20 2227    Noemi Chapel, MD 09/07/20 [phone]

## 2020-09-05 NOTE — ED Notes (Signed)
Report given to floor

## 2020-09-05 NOTE — ED Notes (Signed)
Transport requested to [phone]

## 2020-09-05 NOTE — ED Provider Notes (Signed)
86 year old male, no prior history of lung disease, he did smoke over 40 years ago but does not continue.  He was a tobacco farmer by trade, presents with his daughter who states that over the last week to week and a half he has had some generalized weakness, he has had some increasing coughing and feeling short of breath with wheezing that she can audibly hear.  He has no history of COPD and does not use any bronchodilators at home.  On my exam the patient has no edema, he is very comfortable, very interactive, minimally tachypneic and has diffuse expiratory wheezing without significant rales.  He had does not appear to be in distress and is not using any accessory muscles.  He is not tachycardic.  He has no JVD.  I have personally viewed and interpreted the x-ray, there is some patchy lower lung infiltrates, nothing big, no pneumothorax, no large effusions, no cardiomegaly or abnormal mediastinum.  There is no leukocytosis, his labs are otherwise reassuring including his renal function which is stable if not improved from prior.  Test for Covid, anticipate home with bronchodilator therapy, short course of steroids, bacterial antibiotics of Covid negative.  Patient and family member are in agreement.  I discussed the case with Dr. Jonelle Sidle who will admit.  Medical screening examination/treatment/procedure(s) were conducted as a shared visit with non-physician practitioner(s) and myself.  I personally evaluated the patient during the encounter.  Clinical Impression:   Final diagnoses:  Acute respiratory failure with hypoxia ([place])  COVID-19  Renal insufficiency         Noemi Chapel, MD 09/07/20 1507

## 2020-09-05 NOTE — H&P (Signed)
History and Physical   [name] LOV:[id number] DOB: 1930/12/14 DOA: 09/05/2020  Referring MD/NP/PA: Dr. Noemi Chapel  PCP: Susy Frizzle, MD   Outpatient Specialists: None  Patient coming from: Home  Chief Complaint: Shortness of breath  HPI: [name] is a 86 y.o. male with medical history significant of diabetes, hypertension, BPH, hyperlipidemia, hard of hearing, carotid artery occlusion and osteoarthritis who was fully vaccinated against COVID-19 presenting to the ER with weakness cough and shortness of breath.  Patient apparently has no history of COPD and remote tobacco use.  He was brought in by his daughter that said he has had progressive weakness and increasing coughing and short of breath over the last week.  No significant fever.  Patient is hard of hearing and slightly confused.  Father evaluation shows patient has tested positive for COVID-19.  He is otherwise hemodynamically stable.  Due to new oxygen requirement patient is being admitted to the hospital for evaluation and treatment..  ED Course: Temperature is 99 blood pressure 155/71 pulse 95 respiratory rate of 33 on oxygen sat 85% on room air and 98% on 2 L.  His platelets 123 sodium 132 BUN 28 creatinine 1.70 and calcium 8.5.  Glucose 199 lactic acid 1.3.  The rest of the CBC and CMP are within normal.  LDH 236 ferritin 132 CRP of 6.3.  Urinalysis is negative and COVID-19 screen is positive.  Chest x-ray showed multifocal pneumonia.  Patient being admitted for treatment due to new oxygen requirement  Review of Systems: As per HPI otherwise 10 point review of systems negative.    Past Medical History:  Diagnosis Date  . Arthritis    knee  . Carotid artery occlusion   . Diabetes mellitus    metformin,januvia,and glipizide daily  . Enlarged prostate    pt states no trouble with it  . Hyperlipidemia    takes Simvastatin daily  . Hypertension    takes Atenolol and Lisinopril daily  .  Impaired hearing, left    but doesn't wear hearing aids  . Seasonal allergies     Past Surgical History:  Procedure Laterality Date  . APPENDECTOMY     as a child  . CAROTID ANGIOGRAM N/A 05/09/2013   Procedure: CAROTID ANGIOGRAM;  Surgeon: Serafina Mitchell, MD;  Location: [facility] CATH LAB;  Service: Cardiovascular;  Laterality: N/A;  . CAROTID ENDARTERECTOMY Right 2008    Re-do Mar 04, 2012  . carotidendartectomy  2008   right side  . ENDARTERECTOMY  03/04/2012   Procedure: ENDARTERECTOMY CAROTID;  Surgeon: Rosetta Posner, MD;  Location: [facility] OR;  Service: Vascular;  Laterality: Right;  Redo Right Carotid Endarterectomy with hemasheild patch angioplasty  . ENDARTERECTOMY Right 06/26/2020   Procedure: REDO CAROTID ENDARTERECTOMY;  Surgeon: Rosetta Posner, MD;  Location: [place];  Service: Vascular;  Laterality: Right;  . WOUND EXPLORATION Right 06/26/2020   Procedure: EXPLORATION OF RIGHT NECK INCISION;  Surgeon: Rosetta Posner, MD;  Location: [place];  Service: Vascular;  Laterality: Right;     reports that he quit smoking about 28 years ago. His smoking use included cigarettes. He quit after 25.00 years of use. He has never used smokeless tobacco. He reports that he does not drink alcohol and does not use drugs.  Allergies  Allergen Reactions  . Tape Other (See Comments)    PLEASE USE EASY-RELEASE TAPE!!!! THE SKIN IS VERY SENSITIVE!!    Family History  Problem Relation Age of Onset  .  Cancer Mother        Leukemia  . Leukemia Mother   . Asthma Father   . Anesthesia problems Neg Hx   . Hypotension Neg Hx   . Malignant hyperthermia Neg Hx   . Pseudochol deficiency Neg Hx      Prior to Admission medications   Medication Sig Start Date End Date Taking? Authorizing Provider  atenolol (TENORMIN) 50 MG tablet Take 1 tablet (50 mg total) by mouth daily. 05/31/20  Yes Susy Frizzle, MD  furosemide (LASIX) 20 MG tablet TAKE 1 TABLET BY MOUTH EVERY DAY Patient taking differently: Take 20 mg by  mouth daily.  06/30/19  Yes Susy Frizzle, MD  glipiZIDE (GLUCOTROL XL) 10 MG 24 hr tablet Take 1 tablet (10 mg total) by mouth daily with breakfast. 06/04/20  Yes Susy Frizzle, MD  pioglitazone (ACTOS) 30 MG tablet TAKE 1 TABLET BY MOUTH EVERY DAY Patient taking differently: Take 30 mg by mouth daily.  08/30/20  Yes Susy Frizzle, MD  TRULICITY 2.83 MO/2.9UT SOPN INJECT 0.75 MG INTO THE SKIN ONCE A WEEK. Patient taking differently: Inject 0.75 mg into the skin every Sunday.  02/01/20  Yes Susy Frizzle, MD  aspirin EC 81 MG EC tablet Take 1 tablet (81 mg total) by mouth daily. Swallow whole. Patient not taking: Reported on 09/05/2020 06/28/20   Ulyses Amor, PA-C  glipiZIDE (GLUCOTROL XL) 10 MG 24 hr tablet TAKE 1 TABLET BY MOUTH EVERY DAY WITH BREAKFAST Patient not taking: Reported on 09/05/2020 06/04/20   Susy Frizzle, MD  Glucose Blood (BLOOD GLUCOSE TEST STRIPS) STRP Please dispense as One touch Ultra Blue. Use as directed to monitor FSBS 3x daily. Dx: E11.9. 02/07/19   Susy Frizzle, MD  metFORMIN (GLUCOPHAGE) 1000 MG tablet TAKE 1 TABLET TWICE A DAY WITH A MEAL Patient not taking: Reported on 09/05/2020 06/13/20   Susy Frizzle, MD    Physical Exam: Vitals:   09/05/20 1930 09/05/20 1945 09/05/20 2000 09/05/20 2015  BP: (!) 147/71 (!) 155/71 125/85 130/82  Pulse: 69 73 70 73  Resp:  18    Temp:      TempSrc:      SpO2: 99% 98% 99% 98%      Constitutional: Acutely ill looking, weak, hard of hearing Vitals:   09/05/20 1930 09/05/20 1945 09/05/20 2000 09/05/20 2015  BP: (!) 147/71 (!) 155/71 125/85 130/82  Pulse: 69 73 70 73  Resp:  18    Temp:      TempSrc:      SpO2: 99% 98% 99% 98%   Eyes: PERRL, lids and conjunctivae normal ENMT: Mucous membranes are moist. Posterior pharynx clear of any exudate or lesions.Normal dentition.  Hard of hearing Neck: normal, supple, no masses, no thyromegaly Respiratory: Coarse breath sounds with bilateral rhonchi,  Rales no crackles no wheeze, normal respiratory effort. No accessory muscle use.  Cardiovascular: Regular rate and rhythm, no murmurs / rubs / gallops. No extremity edema. 2+ pedal pulses. No carotid bruits.  Abdomen: no tenderness, no masses palpated. No hepatosplenomegaly. Bowel sounds positive.  Musculoskeletal: no clubbing / cyanosis. No joint deformity upper and lower extremities. Good ROM, no contractures. Normal muscle tone.  Skin: no rashes, lesions, ulcers. No induration Neurologic: CN 2-12 grossly intact. Sensation intact, DTR normal. Strength 5/5 in all 4.  Psychiatric: Normal judgment and insight. Alert and oriented x 3. Normal mood.     Labs on Admission: I have personally reviewed following  labs and imaging studies  CBC: Recent Labs  Lab 09/05/20 1410  WBC 6.1  HGB 14.2  HCT 44.3  MCV 94.5  PLT 563*   Basic Metabolic Panel: Recent Labs  Lab 09/05/20 1410  NA 132*  K 4.1  CL 98  CO2 23  GLUCOSE 199*  BUN 28*  CREATININE 1.70*  CALCIUM 8.5*   GFR: CrCl cannot be calculated (Unknown ideal weight.). Liver Function Tests: No results for input(s): AST, ALT, ALKPHOS, BILITOT, PROT, ALBUMIN in the last 168 hours. No results for input(s): LIPASE, AMYLASE in the last 168 hours. No results for input(s): AMMONIA in the last 168 hours. Coagulation Profile: No results for input(s): INR, PROTIME in the last 168 hours. Cardiac Enzymes: No results for input(s): CKTOTAL, CKMB, CKMBINDEX, TROPONINI in the last 168 hours. BNP (last 3 results) No results for input(s): PROBNP in the last 8760 hours. HbA1C: No results for input(s): HGBA1C in the last 72 hours. CBG: No results for input(s): GLUCAP in the last 168 hours. Lipid Profile: Recent Labs    09/05/20 1816  TRIG 102   Thyroid Function Tests: No results for input(s): TSH, T4TOTAL, FREET4, T3FREE, THYROIDAB in the last 72 hours. Anemia Panel: Recent Labs    09/05/20 1816  FERRITIN 132   Urine analysis:      Component Value Date/Time   COLORURINE YELLOW 09/05/2020 1816   APPEARANCEUR HAZY (A) 09/05/2020 1816   LABSPEC 1.018 09/05/2020 1816   PHURINE 5.0 09/05/2020 1816   GLUCOSEU 150 (A) 09/05/2020 1816   HGBUR SMALL (A) 09/05/2020 1816   BILIRUBINUR NEGATIVE 09/05/2020 1816   KETONESUR NEGATIVE 09/05/2020 1816   PROTEINUR 100 (A) 09/05/2020 1816   UROBILINOGEN 0.2 02/29/2012 1515   NITRITE NEGATIVE 09/05/2020 1816   LEUKOCYTESUR NEGATIVE 09/05/2020 1816   Sepsis Labs: @LABRCNTIP (procalcitonin:4,lacticidven:4) ) Recent Results (from the past 240 hour(s))  Resp Panel by RT-PCR (Flu A&B, Covid) Nasopharyngeal Swab     Status: Abnormal   Collection Time: 09/05/20  3:28 PM   Specimen: Nasopharyngeal Swab; Nasopharyngeal(NP) swabs in vial transport medium  Result Value Ref Range Status   SARS Coronavirus 2 by RT PCR POSITIVE (A) NEGATIVE Final    Comment: RESULT CALLED TO, READ BACK BY AND VERIFIED WITH: B BECK RN [id number] 09/05/20 A BROWNING (NOTE) SARS-CoV-2 target nucleic acids are DETECTED.  The SARS-CoV-2 RNA is generally detectable in upper respiratory specimens during the acute phase of infection. Positive results are indicative of the presence of the identified virus, but do not rule out bacterial infection or co-infection with other pathogens not detected by the test. Clinical correlation with patient history and other diagnostic information is necessary to determine patient infection status. The expected result is Negative.  Fact Sheet for Patients: EntrepreneurPulse.com.au  Fact Sheet for Healthcare Providers: IncredibleEmployment.be  This test is not yet approved or cleared by the [place] FDA and  has been authorized for detection and/or diagnosis of SARS-CoV-2 by FDA under an Emergency Use Authorization (EUA).  This EUA will remain in effect (meaning this test can b e used) for the duration of  the COVID-19 declaration under  Section 564(b)(1) of the Act, 21 U.S.C. section 360bbb-3(b)(1), unless the authorization is terminated or revoked sooner.     Influenza A by PCR NEGATIVE NEGATIVE Final   Influenza B by PCR NEGATIVE NEGATIVE Final    Comment: (NOTE) The Xpert Xpress SARS-CoV-2/FLU/RSV plus assay is intended as an aid in the diagnosis of influenza from Nasopharyngeal swab specimens and should not  be used as a sole basis for treatment. Nasal washings and aspirates are unacceptable for Xpert Xpress SARS-CoV-2/FLU/RSV testing.  Fact Sheet for Patients: EntrepreneurPulse.com.au  Fact Sheet for Healthcare Providers: IncredibleEmployment.be  This test is not yet approved or cleared by the [place] FDA and has been authorized for detection and/or diagnosis of SARS-CoV-2 by FDA under an Emergency Use Authorization (EUA). This EUA will remain in effect (meaning this test can be used) for the duration of the COVID-19 declaration under Section 564(b)(1) of the Act, 21 U.S.C. section 360bbb-3(b)(1), unless the authorization is terminated or revoked.  Performed at [facility], [place] [address], [place], [place] [address]      Radiological Exams on Admission: DG Chest 2 View  Result Date: 09/05/2020 CLINICAL DATA:  Wheezing with cough and fatigue. EXAM: CHEST - 2 VIEW COMPARISON:  02/29/2012 FINDINGS: Two-view exam shows patchy ground-glass opacity in both lungs with a mid and lower lung predominance. No pleural effusion. The cardiopericardial silhouette is within normal limits for size. The visualized bony structures of the thorax show no acute abnormality. IMPRESSION: Patchy bilateral ground-glass opacities, compatible with multifocal pneumonia. Electronically Signed   By: Misty Stanley M.D.   On: 09/05/2020 14:24     Assessment/Plan Principal Problem:   Acute respiratory failure due to COVID-19 [facility]) Active Problems:   HTN (hypertension)   Diabetes  mellitus type II, uncontrolled (HCC)   History of CEA (carotid endarterectomy)   AKI (acute kidney injury) ([place])   Hard of hearing     #1 acute respiratory failure secondary to COVID-19 pneumonia: Patient is presenting with weakness shortness of breath and new oxygen requirement of 2 L.  He will be admitted to the hospital and initiated on the COVID-19 pathway.  Place patient on remdesivir, multivitamins, dexamethasone and breathing treatments, and may start Olumiant although CRP is less than 7.  Once patient is titrated off oxygen may be discharged.  #2 acute kidney injury: Probably prerenal.  We will hydrate but avoid fluid overload.  #3 thrombocytopenia: Secondary to COVID-19 infection.  Monitor closely.  #4 essential hypertension: Continue home regimen.  #5 uncontrolled diabetes: Sliding scale insulin.  Add Tradjenta while in hospital for effective Covid recovery.    DVT prophylaxis: Lovenox Code Status: Full code Family Communication: Daughter Disposition Plan: Home Consults called: None Admission status: Inpatient  Severity of Illness: The appropriate patient status for this patient is INPATIENT. Inpatient status is judged to be reasonable and necessary in order to provide the required intensity of service to ensure the patient's safety. The patient's presenting symptoms, physical exam findings, and initial radiographic and laboratory data in the context of their chronic comorbidities is felt to place them at high risk for further clinical deterioration. Furthermore, it is not anticipated that the patient will be medically stable for discharge from the hospital within 2 midnights of admission. The following factors support the patient status of inpatient.   " The patient's presenting symptoms include weakness and shortness of breath. " The worrisome physical exam findings include coarse breath sound bilaterally no wheeze. " The initial radiographic and laboratory data are  worrisome because of multifocal pneumonia and Covid positive. " The chronic co-morbidities include diabetes with hypertension.   * I certify that at the point of admission it is my clinical judgment that the patient will require inpatient hospital care spanning beyond 2 midnights from the point of admission due to high intensity of service, high risk for further deterioration and high frequency of  surveillance required.Barbette Merino MD [facility] Pager [phone]  If 7PM-7AM, please contact night-coverage [email or web address] Password [email or web address]  09/05/2020, 9:20 PM

## 2020-09-06 LAB — CBC WITH DIFFERENTIAL/PLATELET
Abs Immature Granulocytes: 0.03 10*3/uL (ref 0.00–0.07)
Basophils Absolute: 0 10*3/uL (ref 0.0–0.1)
Basophils Relative: 0 %
Eosinophils Absolute: 0 10*3/uL (ref 0.0–0.5)
Eosinophils Relative: 0 %
HCT: 40.9 % (ref 39.0–52.0)
Hemoglobin: 13.1 g/dL (ref 13.0–17.0)
Immature Granulocytes: 1 %
Lymphocytes Relative: 17 %
Lymphs Abs: 0.4 10*3/uL — ABNORMAL LOW (ref 0.7–4.0)
MCH: 30 pg (ref 26.0–34.0)
MCHC: 32 g/dL (ref 30.0–36.0)
MCV: 93.6 fL (ref 80.0–100.0)
Monocytes Absolute: 0.1 10*3/uL (ref 0.1–1.0)
Monocytes Relative: 4 %
Neutro Abs: 1.8 10*3/uL (ref 1.7–7.7)
Neutrophils Relative %: 78 %
Platelets: 102 10*3/uL — ABNORMAL LOW (ref 150–400)
RBC: 4.37 MIL/uL (ref 4.22–5.81)
RDW: 13.2 % (ref 11.5–15.5)
WBC: 2.3 10*3/uL — ABNORMAL LOW (ref 4.0–10.5)
nRBC: 0 % (ref 0.0–0.2)

## 2020-09-06 LAB — COMPREHENSIVE METABOLIC PANEL
ALT: 18 U/L (ref 0–44)
AST: 26 U/L (ref 15–41)
Albumin: 2.8 g/dL — ABNORMAL LOW (ref 3.5–5.0)
Alkaline Phosphatase: 55 U/L (ref 38–126)
Anion gap: 12 (ref 5–15)
BUN: 31 mg/dL — ABNORMAL HIGH (ref 8–23)
CO2: 19 mmol/L — ABNORMAL LOW (ref 22–32)
Calcium: 8.2 mg/dL — ABNORMAL LOW (ref 8.9–10.3)
Chloride: 104 mmol/L (ref 98–111)
Creatinine, Ser: 1.65 mg/dL — ABNORMAL HIGH (ref 0.61–1.24)
GFR, Estimated: 40 mL/min — ABNORMAL LOW (ref >60)
Glucose, Bld: 257 mg/dL — ABNORMAL HIGH (ref 70–99)
Potassium: 3.9 mmol/L (ref 3.5–5.1)
Sodium: 135 mmol/L (ref 135–145)
Total Bilirubin: 0.3 mg/dL (ref 0.3–1.2)
Total Protein: 5.7 g/dL — ABNORMAL LOW (ref 6.5–8.1)

## 2020-09-06 LAB — GLUCOSE, CAPILLARY
Glucose-Capillary: 203 mg/dL — ABNORMAL HIGH (ref 70–99)
Glucose-Capillary: 250 mg/dL — ABNORMAL HIGH (ref 70–99)
Glucose-Capillary: 167 mg/dL — ABNORMAL HIGH (ref 70–99)
Glucose-Capillary: 173 mg/dL — ABNORMAL HIGH (ref 70–99)

## 2020-09-06 LAB — C-REACTIVE PROTEIN: CRP: 6.5 mg/dL — ABNORMAL HIGH (ref <1.0)

## 2020-09-06 LAB — D-DIMER, QUANTITATIVE: D-Dimer, Quant: 1.05 ug/mL-FEU — ABNORMAL HIGH (ref 0.00–0.50)

## 2020-09-06 LAB — CREATININE, SERUM
Creatinine, Ser: 1.85 mg/dL — ABNORMAL HIGH (ref 0.61–1.24)
GFR, Estimated: 35 mL/min — ABNORMAL LOW (ref >60)

## 2020-09-06 LAB — FERRITIN: Ferritin: 131 ng/mL (ref 24–336)

## 2020-09-06 MED ORDER — POLYETHYLENE GLYCOL 3350 17 G PO PACK
17.0000 g | PACK | Freq: Every day | ORAL | Status: DC
  Administered 2020-09-07 – 2020-09-08 (×2): 17 g via ORAL
  Filled 2020-09-06 (×2): qty 1

## 2020-09-06 MED ORDER — BISACODYL 5 MG PO TBEC
10.0000 mg | DELAYED_RELEASE_TABLET | Freq: Every day | ORAL | Status: AC
  Administered 2020-09-06 – 2020-09-08 (×3): 10 mg via ORAL
  Filled 2020-09-06 (×3): qty 2

## 2020-09-06 NOTE — Plan of Care (Signed)
  Problem: Education: Goal: Knowledge of risk factors and measures for prevention of condition will improve Outcome: Progressing   Problem: Respiratory: Goal: Will maintain a patent airway Outcome: Progressing Goal: Complications related to the disease process, condition or treatment will be avoided or minimized Outcome: Progressing   

## 2020-09-06 NOTE — Plan of Care (Signed)
Patient admitted for ARF due tp Covid-19. Patient arrived into the unit 2150. [place], oriemted but forgetful. Vital signs stable lungs sounds diminished patient came into the floor with 2L oxygen, Patient was made comfortable and oriented to room and equipment will continue to monitor.

## 2020-09-06 NOTE — Progress Notes (Addendum)
PROGRESS NOTE    [name]  ZOX:[id number] DOB: 10-28-1930 DOA: 09/05/2020 PCP: Susy Frizzle, MD   Brief Narrative: HPI per Dr. Jonelle Sidle on 09/05/2020 [name] is a 86 y.o. male with medical history significant of diabetes, hypertension, BPH, hyperlipidemia, hard of hearing, carotid artery occlusion and osteoarthritis who was fully vaccinated against COVID-19 presenting to the ER with weakness cough and shortness of breath.  Patient apparently has no history of COPD and remote tobacco use.  He was brought in by his daughter that said he has had progressive weakness and increasing coughing and short of breath over the last week.  No significant fever.  Patient is hard of hearing and slightly confused.  Father evaluation shows patient has tested positive for COVID-19.  He is otherwise hemodynamically stable.  Due to new oxygen requirement patient is being admitted to the hospital for evaluation and treatment..  ED Course: Temperature is 99 blood pressure 155/71 pulse 95 respiratory rate of 33 on oxygen sat 85% on room air and 98% on 2 L.  His platelets 123 sodium 132 BUN 28 creatinine 1.70 and calcium 8.5.  Glucose 199 lactic acid 1.3.  The rest of the CBC and CMP are within normal.  LDH 236 ferritin 132 CRP of 6.3.  Urinalysis is negative and COVID-19 screen is positive.  Chest x-ray showed multifocal pneumonia.  Patient being admitted for treatment due to new oxygen requirement  Assessment & Plan:   Principal Problem:   Acute respiratory failure due to COVID-19 [facility]) Active Problems:   HTN (hypertension)   Diabetes mellitus type II, uncontrolled ([place])   History of CEA (carotid endarterectomy)   AKI (acute kidney injury) ([place])   Hard of hearing    #1 acute  hypoxic respiratory failure secondary to COVID-19 pneumonia-patient presented with shortness of breath and cough.  Chest x-ray with groundglass opacities in both lung fields. He is on 2 L of oxygen maintaining  saturation above 92%. He was started on steroids and remdesivir and baricitinib which is being continued. His CRP is 6.5 ferritin 131 LDH 236 procalcitonin was low.  D-dimer was 1.05.  White count was 2.3. Finish course of remdesivir continue steroids.  Continue baricitinib. Out of bed as tolerated. Encouraged to use incentive spirometry and flutter valve.  #2 constipation started on MiraLAX and Dulcolax.  According to patient's daughter patient has chronic constipation.  #3 type 2 diabetes he is on glipizide 10 mg daily with Metformin 1000 mg twice a day and Trulicity and Actos at home. Continue SSI, Levemir and Tradjenta. CBG (last 3)  Recent Labs    09/05/20 2154 09/06/20 0808 09/06/20 1229  GLUCAP 225* 203* 250*   #4 history of essential hypertension patient is on atenolol 50 mg at home.  This has not been restarted due to soft blood pressure.  His blood pressure is 111/73.  Estimated body mass index is 29.09 kg/m as calculated from the following:   Height as of this encounter: 5\' 5"  (1.651 m).   Weight as of this encounter: 79.3 kg.  DVT prophylaxis: Lovenox Code Status: Full code Family Communication: Discussed with his daughter over the phone.  Patient lives at home with his 79-year-old wife who is healthy. Disposition Plan:  Status is: Inpatient  Dispo: The patient is from: Home              Anticipated d/c is to: Home              Anticipated d/c  date is: 2 days              Patient currently is not medically stable to d/c.   Consultants: None  Procedures: None Antimicrobials: None  Subjective: He is resting in bed eating breakfast very hard of hearing.  Staff reported patient has not had a BM for 4 days. Objective: Vitals:   09/05/20 2358 09/06/20 0404 09/06/20 0805 09/06/20 1226  BP: (!) 151/92 118/71 (!) 157/103 111/73  Pulse: 60 66 75 78  Resp: 17 18 18 19   Temp: 97.9 F (36.6 C) 99 F (37.2 C) (!) 97.3 F (36.3 C) 98.1 F (36.7 C)  TempSrc: Oral Oral  Oral Oral  SpO2: 94% 95% 90% 100%  Weight:      Height:        Intake/Output Summary (Last 24 hours) at 09/06/2020 1228 Last data filed at 09/06/2020 0347 Gross per 24 hour  Intake 478.06 ml  Output --  Net 478.06 ml   Filed Weights   09/05/20 2200  Weight: 79.3 kg    Examination:  General exam: Appears calm and comfortable  Respiratory system coarse breath sounds bilaterally to auscultation. Respiratory effort normal. Cardiovascular system: S1 & S2 heard, RRR. No JVD, murmurs, rubs, gallops or clicks. No pedal edema. Gastrointestinal system: Abdomen is distended, soft and nontender. No organomegaly or masses felt. Normal bowel sounds heard. Central nervous system: Alert and oriented. No focal neurological deficits. Extremities: Symmetric 5 x 5 power. Skin: No rashes, lesions or ulcers Psychiatry: Judgement and insight appear normal. Mood & affect appropriate.     Data Reviewed: I have personally reviewed following labs and imaging studies  CBC: Recent Labs  Lab 09/05/20 1410 09/05/20 2209 09/06/20 0046  WBC 6.1 3.3* 2.3*  NEUTROABS  --   --  1.8  HGB 14.2 14.0 13.1  HCT 44.3 42.7 40.9  MCV 94.5 92.6 93.6  PLT 123* 115* 681*   Basic Metabolic Panel: Recent Labs  Lab 09/05/20 1410 09/05/20 2209 09/06/20 0046  NA 132*  --  135  K 4.1  --  3.9  CL 98  --  104  CO2 23  --  19*  GLUCOSE 199*  --  257*  BUN 28*  --  31*  CREATININE 1.70* 1.85* 1.65*  CALCIUM 8.5*  --  8.2*   GFR: Estimated Creatinine Clearance: 30.6 mL/min (A) (by C-G formula based on SCr of 1.65 mg/dL (H)). Liver Function Tests: Recent Labs  Lab 09/06/20 0046  AST 26  ALT 18  ALKPHOS 55  BILITOT 0.3  PROT 5.7*  ALBUMIN 2.8*   No results for input(s): LIPASE, AMYLASE in the last 168 hours. No results for input(s): AMMONIA in the last 168 hours. Coagulation Profile: No results for input(s): INR, PROTIME in the last 168 hours. Cardiac Enzymes: No results for input(s): CKTOTAL,  CKMB, CKMBINDEX, TROPONINI in the last 168 hours. BNP (last 3 results) No results for input(s): PROBNP in the last 8760 hours. HbA1C: No results for input(s): HGBA1C in the last 72 hours. CBG: Recent Labs  Lab 09/05/20 2154 09/06/20 0808  GLUCAP 225* 203*   Lipid Profile: Recent Labs    09/05/20 1816  TRIG 102   Thyroid Function Tests: No results for input(s): TSH, T4TOTAL, FREET4, T3FREE, THYROIDAB in the last 72 hours. Anemia Panel: Recent Labs    09/05/20 1816 09/06/20 0046  FERRITIN 132 131   Sepsis Labs: Recent Labs  Lab 09/05/20 1816  PROCALCITON <0.10  LATICACIDVEN 1.3  Recent Results (from the past 240 hour(s))  Resp Panel by RT-PCR (Flu A&B, Covid) Nasopharyngeal Swab     Status: Abnormal   Collection Time: 09/05/20  3:28 PM   Specimen: Nasopharyngeal Swab; Nasopharyngeal(NP) swabs in vial transport medium  Result Value Ref Range Status   SARS Coronavirus 2 by RT PCR POSITIVE (A) NEGATIVE Final    Comment: RESULT CALLED TO, READ BACK BY AND VERIFIED WITH: B BECK RN [id number] 09/05/20 A BROWNING (NOTE) SARS-CoV-2 target nucleic acids are DETECTED.  The SARS-CoV-2 RNA is generally detectable in upper respiratory specimens during the acute phase of infection. Positive results are indicative of the presence of the identified virus, but do not rule out bacterial infection or co-infection with other pathogens not detected by the test. Clinical correlation with patient history and other diagnostic information is necessary to determine patient infection status. The expected result is Negative.  Fact Sheet for Patients: EntrepreneurPulse.com.au  Fact Sheet for Healthcare Providers: IncredibleEmployment.be  This test is not yet approved or cleared by the [place] FDA and  has been authorized for detection and/or diagnosis of SARS-CoV-2 by FDA under an Emergency Use Authorization (EUA).  This EUA will remain in effect  (meaning this test can b e used) for the duration of  the COVID-19 declaration under Section 564(b)(1) of the Act, 21 U.S.C. section 360bbb-3(b)(1), unless the authorization is terminated or revoked sooner.     Influenza A by PCR NEGATIVE NEGATIVE Final   Influenza B by PCR NEGATIVE NEGATIVE Final    Comment: (NOTE) The Xpert Xpress SARS-CoV-2/FLU/RSV plus assay is intended as an aid in the diagnosis of influenza from Nasopharyngeal swab specimens and should not be used as a sole basis for treatment. Nasal washings and aspirates are unacceptable for Xpert Xpress SARS-CoV-2/FLU/RSV testing.  Fact Sheet for Patients: EntrepreneurPulse.com.au  Fact Sheet for Healthcare Providers: IncredibleEmployment.be  This test is not yet approved or cleared by the [place] FDA and has been authorized for detection and/or diagnosis of SARS-CoV-2 by FDA under an Emergency Use Authorization (EUA). This EUA will remain in effect (meaning this test can be used) for the duration of the COVID-19 declaration under Section 564(b)(1) of the Act, 21 U.S.C. section 360bbb-3(b)(1), unless the authorization is terminated or revoked.  Performed at [facility], [place] [address] [place], [place] [address]          Radiology Studies: DG Chest 2 View  Result Date: 09/05/2020 CLINICAL DATA:  Wheezing with cough and fatigue. EXAM: CHEST - 2 VIEW COMPARISON:  02/29/2012 FINDINGS: Two-view exam shows patchy ground-glass opacity in both lungs with a mid and lower lung predominance. No pleural effusion. The cardiopericardial silhouette is within normal limits for size. The visualized bony structures of the thorax show no acute abnormality. IMPRESSION: Patchy bilateral ground-glass opacities, compatible with multifocal pneumonia. Electronically Signed   By: Misty Stanley M.D.   On: 09/05/2020 14:24        Scheduled Meds: . baricitinib  2 mg Oral Daily  .  dexamethasone  6 mg Oral Q24H  . enoxaparin (LOVENOX) injection  40 mg Subcutaneous Q24H  . insulin aspart  0-20 Units Subcutaneous TID WC  . insulin aspart  0-5 Units Subcutaneous QHS  . insulin detemir  0.15 Units/kg Subcutaneous BID  . Ipratropium-Albuterol  1 puff Inhalation Q6H  . linagliptin  5 mg Oral Daily   Continuous Infusions: . sodium chloride 75 mL/hr at 09/05/20 2212  . remdesivir 100 mg in NS 100 mL  100 mg (09/06/20 1058)     LOS: 1 day     Georgette Shell, MD 09/06/2020, 12:28 PM

## 2020-09-07 LAB — COMPREHENSIVE METABOLIC PANEL
ALT: 20 U/L (ref 0–44)
AST: 26 U/L (ref 15–41)
Albumin: 2.8 g/dL — ABNORMAL LOW (ref 3.5–5.0)
Alkaline Phosphatase: 54 U/L (ref 38–126)
Anion gap: 12 (ref 5–15)
BUN: 50 mg/dL — ABNORMAL HIGH (ref 8–23)
CO2: 21 mmol/L — ABNORMAL LOW (ref 22–32)
Calcium: 8.4 mg/dL — ABNORMAL LOW (ref 8.9–10.3)
Chloride: 100 mmol/L (ref 98–111)
Creatinine, Ser: 2.13 mg/dL — ABNORMAL HIGH (ref 0.61–1.24)
GFR, Estimated: 29 mL/min — ABNORMAL LOW (ref >60)
Glucose, Bld: 167 mg/dL — ABNORMAL HIGH (ref 70–99)
Potassium: 4.1 mmol/L (ref 3.5–5.1)
Sodium: 133 mmol/L — ABNORMAL LOW (ref 135–145)
Total Bilirubin: 0.5 mg/dL (ref 0.3–1.2)
Total Protein: 5.7 g/dL — ABNORMAL LOW (ref 6.5–8.1)

## 2020-09-07 LAB — CBC WITH DIFFERENTIAL/PLATELET
Abs Immature Granulocytes: 0.06 10*3/uL (ref 0.00–0.07)
Basophils Absolute: 0 10*3/uL (ref 0.0–0.1)
Basophils Relative: 0 %
Eosinophils Absolute: 0 10*3/uL (ref 0.0–0.5)
Eosinophils Relative: 0 %
HCT: 38.1 % — ABNORMAL LOW (ref 39.0–52.0)
Hemoglobin: 12.7 g/dL — ABNORMAL LOW (ref 13.0–17.0)
Immature Granulocytes: 1 %
Lymphocytes Relative: 14 %
Lymphs Abs: 1.1 10*3/uL (ref 0.7–4.0)
MCH: 30.6 pg (ref 26.0–34.0)
MCHC: 33.3 g/dL (ref 30.0–36.0)
MCV: 91.8 fL (ref 80.0–100.0)
Monocytes Absolute: 0.7 10*3/uL (ref 0.1–1.0)
Monocytes Relative: 8 %
Neutro Abs: 6.3 10*3/uL (ref 1.7–7.7)
Neutrophils Relative %: 77 %
Platelets: 144 10*3/uL — ABNORMAL LOW (ref 150–400)
RBC: 4.15 MIL/uL — ABNORMAL LOW (ref 4.22–5.81)
RDW: 13.2 % (ref 11.5–15.5)
WBC: 8.2 10*3/uL (ref 4.0–10.5)
nRBC: 0 % (ref 0.0–0.2)

## 2020-09-07 LAB — FERRITIN: Ferritin: 169 ng/mL (ref 24–336)

## 2020-09-07 LAB — GLUCOSE, CAPILLARY
Glucose-Capillary: 140 mg/dL — ABNORMAL HIGH (ref 70–99)
Glucose-Capillary: 187 mg/dL — ABNORMAL HIGH (ref 70–99)
Glucose-Capillary: 201 mg/dL — ABNORMAL HIGH (ref 70–99)
Glucose-Capillary: 204 mg/dL — ABNORMAL HIGH (ref 70–99)

## 2020-09-07 LAB — D-DIMER, QUANTITATIVE: D-Dimer, Quant: 1.16 ug/mL-FEU — ABNORMAL HIGH (ref 0.00–0.50)

## 2020-09-07 LAB — C-REACTIVE PROTEIN: CRP: 4 mg/dL — ABNORMAL HIGH (ref <1.0)

## 2020-09-07 MED ORDER — BISACODYL 10 MG RE SUPP
10.0000 mg | Freq: Once | RECTAL | Status: AC
  Administered 2020-09-07: 10 mg via RECTAL
  Filled 2020-09-07: qty 1

## 2020-09-07 MED ORDER — LACTULOSE 10 GM/15ML PO SOLN
20.0000 g | Freq: Once | ORAL | Status: AC
  Administered 2020-09-07: 20 g via ORAL
  Filled 2020-09-07: qty 30

## 2020-09-07 MED ORDER — BARICITINIB 1 MG PO TABS
1.0000 mg | ORAL_TABLET | Freq: Every day | ORAL | Status: DC
  Administered 2020-09-08: 1 mg via ORAL
  Filled 2020-09-07: qty 1

## 2020-09-07 MED ORDER — ENOXAPARIN SODIUM 30 MG/0.3ML ~~LOC~~ SOLN
30.0000 mg | SUBCUTANEOUS | Status: DC
  Administered 2020-09-07: 30 mg via SUBCUTANEOUS
  Filled 2020-09-07: qty 0.3

## 2020-09-07 NOTE — Progress Notes (Signed)
PROGRESS NOTE    [name]  DGL:[id number] DOB: 02-Jan-1931 DOA: 09/05/2020 PCP: Susy Frizzle, MD   Brief Narrative: HPI per Dr. Jonelle Sidle on 09/05/2020 [name] is a 86 y.o. male with medical history significant of diabetes, hypertension, BPH, hyperlipidemia, hard of hearing, carotid artery occlusion and osteoarthritis who was fully vaccinated against COVID-19 presenting to the ER with weakness cough and shortness of breath.  Patient apparently has no history of COPD and remote tobacco use.  He was brought in by his daughter that said he has had progressive weakness and increasing coughing and short of breath over the last week.  No significant fever.  Patient is hard of hearing and slightly confused.  Father evaluation shows patient has tested positive for COVID-19.  He is otherwise hemodynamically stable.  Due to new oxygen requirement patient is being admitted to the hospital for evaluation and treatment..  ED Course: Temperature is 99 blood pressure 155/71 pulse 95 respiratory rate of 33 on oxygen sat 85% on room air and 98% on 2 L.  His platelets 123 sodium 132 BUN 28 creatinine 1.70 and calcium 8.5.  Glucose 199 lactic acid 1.3.  The rest of the CBC and CMP are within normal.  LDH 236 ferritin 132 CRP of 6.3.  Urinalysis is negative and COVID-19 screen is positive.  Chest x-ray showed multifocal pneumonia.  Patient being admitted for treatment due to new oxygen requirement  Assessment & Plan:   Principal Problem:   Acute respiratory failure due to COVID-19 [facility]) Active Problems:   HTN (hypertension)   Diabetes mellitus type II, uncontrolled ([place])   History of CEA (carotid endarterectomy)   AKI (acute kidney injury) ([place])   Hard of hearing    #1 acute  hypoxic respiratory failure secondary to COVID-19 pneumonia-patient presented with shortness of breath and cough.  Chest x-ray with groundglass opacities in both lung fields. He is on 2 L of oxygen maintaining  saturation above 92%. He was started on steroids and remdesivir and baricitinib which is being continued. CRP 4.0 down from 6.5 Pro-Cal less than 0.10, lactic acid 1.3, white count 8.2. Finish course of remdesivir continue steroids.  Continue baricitinib. Out of bed as tolerated. Encouraged to use incentive spirometry and flutter valve.  #2 constipation was started on MiraLAX and Dulcolax.  He reports he still has not had a BM.  Will add laxatives today.  Patient's daughter reports patient has chronic constipation.    #3 type 2 diabetes he is on glipizide 10 mg daily with Metformin 1000 mg twice a day and Trulicity and Actos at home. Continue SSI, Levemir and Tradjenta. CBG (last 3)  Recent Labs    09/06/20 1727 09/06/20 2107 09/07/20 0738  GLUCAP 167* 173* 140*   #4 history of essential hypertension patient is on atenolol 50 mg at home.  This has not been restarted due to soft blood pressure.  His blood pressure is 111/73.  Restart in a.m. if needed.  #5 CKD stage IV-creatinine mildly increased not on any nephrotoxic agents monitor closely.  Estimated body mass index is 29.09 kg/m as calculated from the following:   Height as of this encounter: 5\' 5"  (1.651 m).   Weight as of this encounter: 79.3 kg.  DVT prophylaxis: Lovenox Code Status: Full code Family Communication: Discussed with his daughter 09/07/2020 over the phone.  Patient lives at home with his 40-year-old wife who is healthy. Disposition Plan:  Status is: Inpatient  Dispo: The patient is from: Home  Anticipated d/c is to: Home              Anticipated d/c date is: 2 days              Patient currently is not medically stable to d/c.   Consultants: None  Procedures: None Antimicrobials: None  Subjective: He is resting in bed awake and alert his only complaint is he has not had number 2 He is urinating well  objective: Vitals:   09/06/20 1950 09/07/20 0022 09/07/20 0422 09/07/20 0737  BP: 126/74  (!) 154/76 (!) 141/62 135/74  Pulse: 77 77 82 87  Resp: 16 19 15 19   Temp: (!) 97.3 F (36.3 C) (!) 97.5 F (36.4 C) 97.6 F (36.4 C) 98 F (36.7 C)  TempSrc: Oral Oral Oral Oral  SpO2: 98% 97% 96% 96%  Weight:      Height:        Intake/Output Summary (Last 24 hours) at 09/07/2020 1123 Last data filed at 09/07/2020 5726 Gross per 24 hour  Intake 320 ml  Output 1180 ml  Net -860 ml   Filed Weights   09/05/20 2200  Weight: 79.3 kg    Examination:  General exam: Appears calm and comfortable  Respiratory system coarse breath sounds bilaterally to auscultation. Respiratory effort normal. Cardiovascular system: S1 & S2 heard, RRR. No JVD, murmurs, rubs, gallops or clicks. No pedal edema. Gastrointestinal system: Abdomen is distended, soft and nontender. No organomegaly or masses felt. Normal bowel sounds heard. Central nervous system: Alert and oriented. No focal neurological deficits. Extremities: Symmetric 5 x 5 power. Skin: No rashes, lesions or ulcers Psychiatry: Judgement and insight appear normal. Mood & affect appropriate.     Data Reviewed: I have personally reviewed following labs and imaging studies  CBC: Recent Labs  Lab 09/05/20 1410 09/05/20 2209 09/06/20 0046 09/07/20 0409  WBC 6.1 3.3* 2.3* 8.2  NEUTROABS  --   --  1.8 6.3  HGB 14.2 14.0 13.1 12.7*  HCT 44.3 42.7 40.9 38.1*  MCV 94.5 92.6 93.6 91.8  PLT 123* 115* 102* 203*   Basic Metabolic Panel: Recent Labs  Lab 09/05/20 1410 09/05/20 2209 09/06/20 0046 09/07/20 0409  NA 132*  --  135 133*  K 4.1  --  3.9 4.1  CL 98  --  104 100  CO2 23  --  19* 21*  GLUCOSE 199*  --  257* 167*  BUN 28*  --  31* 50*  CREATININE 1.70* 1.85* 1.65* 2.13*  CALCIUM 8.5*  --  8.2* 8.4*   GFR: Estimated Creatinine Clearance: 23.7 mL/min (A) (by C-G formula based on SCr of 2.13 mg/dL (H)). Liver Function Tests: Recent Labs  Lab 09/06/20 0046 09/07/20 0409  AST 26 26  ALT 18 20  ALKPHOS 55 54    BILITOT 0.3 0.5  PROT 5.7* 5.7*  ALBUMIN 2.8* 2.8*   No results for input(s): LIPASE, AMYLASE in the last 168 hours. No results for input(s): AMMONIA in the last 168 hours. Coagulation Profile: No results for input(s): INR, PROTIME in the last 168 hours. Cardiac Enzymes: No results for input(s): CKTOTAL, CKMB, CKMBINDEX, TROPONINI in the last 168 hours. BNP (last 3 results) No results for input(s): PROBNP in the last 8760 hours. HbA1C: No results for input(s): HGBA1C in the last 72 hours. CBG: Recent Labs  Lab 09/06/20 0808 09/06/20 1229 09/06/20 1727 09/06/20 2107 09/07/20 0738  GLUCAP 203* 250* 167* 173* 140*   Lipid Profile: Recent Labs  09/05/20 1816  TRIG 102   Thyroid Function Tests: No results for input(s): TSH, T4TOTAL, FREET4, T3FREE, THYROIDAB in the last 72 hours. Anemia Panel: Recent Labs    09/06/20 0046 09/07/20 0409  FERRITIN 131 169   Sepsis Labs: Recent Labs  Lab 09/05/20 1816  PROCALCITON <0.10  LATICACIDVEN 1.3    Recent Results (from the past 240 hour(s))  Resp Panel by RT-PCR (Flu A&B, Covid) Nasopharyngeal Swab     Status: Abnormal   Collection Time: 09/05/20  3:28 PM   Specimen: Nasopharyngeal Swab; Nasopharyngeal(NP) swabs in vial transport medium  Result Value Ref Range Status   SARS Coronavirus 2 by RT PCR POSITIVE (A) NEGATIVE Final    Comment: RESULT CALLED TO, READ BACK BY AND VERIFIED WITH: B BECK RN [id number] 09/05/20 A BROWNING (NOTE) SARS-CoV-2 target nucleic acids are DETECTED.  The SARS-CoV-2 RNA is generally detectable in upper respiratory specimens during the acute phase of infection. Positive results are indicative of the presence of the identified virus, but do not rule out bacterial infection or co-infection with other pathogens not detected by the test. Clinical correlation with patient history and other diagnostic information is necessary to determine patient infection status. The expected result is  Negative.  Fact Sheet for Patients: EntrepreneurPulse.com.au  Fact Sheet for Healthcare Providers: IncredibleEmployment.be  This test is not yet approved or cleared by the [place] FDA and  has been authorized for detection and/or diagnosis of SARS-CoV-2 by FDA under an Emergency Use Authorization (EUA).  This EUA will remain in effect (meaning this test can b e used) for the duration of  the COVID-19 declaration under Section 564(b)(1) of the Act, 21 U.S.C. section 360bbb-3(b)(1), unless the authorization is terminated or revoked sooner.     Influenza A by PCR NEGATIVE NEGATIVE Final   Influenza B by PCR NEGATIVE NEGATIVE Final    Comment: (NOTE) The Xpert Xpress SARS-CoV-2/FLU/RSV plus assay is intended as an aid in the diagnosis of influenza from Nasopharyngeal swab specimens and should not be used as a sole basis for treatment. Nasal washings and aspirates are unacceptable for Xpert Xpress SARS-CoV-2/FLU/RSV testing.  Fact Sheet for Patients: EntrepreneurPulse.com.au  Fact Sheet for Healthcare Providers: IncredibleEmployment.be  This test is not yet approved or cleared by the [place] FDA and has been authorized for detection and/or diagnosis of SARS-CoV-2 by FDA under an Emergency Use Authorization (EUA). This EUA will remain in effect (meaning this test can be used) for the duration of the COVID-19 declaration under Section 564(b)(1) of the Act, 21 U.S.C. section 360bbb-3(b)(1), unless the authorization is terminated or revoked.  Performed at [facility], [place] [address], [place], [place] [address]   Blood Culture (routine x 2)     Status: None (Preliminary result)   Collection Time: 09/05/20  6:17 PM   Specimen: BLOOD RIGHT HAND  Result Value Ref Range Status   Specimen Description BLOOD RIGHT HAND  Final   Special Requests   Final    BOTTLES DRAWN AEROBIC AND ANAEROBIC Blood  Culture adequate volume   Culture   Final    NO GROWTH 2 DAYS Performed at [facility], [place] [address] [place], [place] [address]    Report Status PENDING  Incomplete  Blood Culture (routine x 2)     Status: None (Preliminary result)   Collection Time: 09/05/20 10:09 PM   Specimen: BLOOD  Result Value Ref Range Status   Specimen Description BLOOD SITE NOT SPECIFIED  Final  Special Requests   Final    BOTTLES DRAWN AEROBIC AND ANAEROBIC Blood Culture results may not be optimal due to an excessive volume of blood received in culture bottles   Culture   Final    NO GROWTH 2 DAYS Performed at [facility], [place] [address] [place], [place] [address]    Report Status PENDING  Incomplete         Radiology Studies: DG Chest 2 View  Result Date: 09/05/2020 CLINICAL DATA:  Wheezing with cough and fatigue. EXAM: CHEST - 2 VIEW COMPARISON:  02/29/2012 FINDINGS: Two-view exam shows patchy ground-glass opacity in both lungs with a mid and lower lung predominance. No pleural effusion. The cardiopericardial silhouette is within normal limits for size. The visualized bony structures of the thorax show no acute abnormality. IMPRESSION: Patchy bilateral ground-glass opacities, compatible with multifocal pneumonia. Electronically Signed   By: Misty Stanley M.D.   On: 09/05/2020 14:24        Scheduled Meds:  baricitinib  2 mg Oral Daily   bisacodyl  10 mg Oral Daily   bisacodyl  10 mg Rectal Once   dexamethasone  6 mg Oral Q24H   enoxaparin (LOVENOX) injection  40 mg Subcutaneous Q24H   insulin aspart  0-20 Units Subcutaneous TID WC   insulin aspart  0-5 Units Subcutaneous QHS   insulin detemir  0.15 Units/kg Subcutaneous BID   Ipratropium-Albuterol  1 puff Inhalation Q6H   lactulose  20 g Oral Once   linagliptin  5 mg Oral Daily   polyethylene glycol  17 g Oral Daily   Continuous Infusions:  remdesivir 100 mg in NS 100 mL 100 mg (09/07/20 1051)     LOS: 2  days     Georgette Shell, MD 09/07/2020, 11:23 AM

## 2020-09-08 ENCOUNTER — Ambulatory Visit (HOSPITAL_COMMUNITY): Payer: PPO

## 2020-09-08 LAB — CBC WITH DIFFERENTIAL/PLATELET
Abs Immature Granulocytes: 0.05 10*3/uL (ref 0.00–0.07)
Basophils Absolute: 0 10*3/uL (ref 0.0–0.1)
Basophils Relative: 0 %
Eosinophils Absolute: 0 10*3/uL (ref 0.0–0.5)
Eosinophils Relative: 0 %
HCT: 37 % — ABNORMAL LOW (ref 39.0–52.0)
Hemoglobin: 12.4 g/dL — ABNORMAL LOW (ref 13.0–17.0)
Immature Granulocytes: 1 %
Lymphocytes Relative: 12 %
Lymphs Abs: 1.1 10*3/uL (ref 0.7–4.0)
MCH: 30.5 pg (ref 26.0–34.0)
MCHC: 33.5 g/dL (ref 30.0–36.0)
MCV: 90.9 fL (ref 80.0–100.0)
Monocytes Absolute: 0.8 10*3/uL (ref 0.1–1.0)
Monocytes Relative: 9 %
Neutro Abs: 7.1 10*3/uL (ref 1.7–7.7)
Neutrophils Relative %: 78 %
Platelets: 167 10*3/uL (ref 150–400)
RBC: 4.07 MIL/uL — ABNORMAL LOW (ref 4.22–5.81)
RDW: 13.3 % (ref 11.5–15.5)
WBC: 9 10*3/uL (ref 4.0–10.5)
nRBC: 0 % (ref 0.0–0.2)

## 2020-09-08 LAB — COMPREHENSIVE METABOLIC PANEL
ALT: 22 U/L (ref 0–44)
AST: 27 U/L (ref 15–41)
Albumin: 2.7 g/dL — ABNORMAL LOW (ref 3.5–5.0)
Alkaline Phosphatase: 49 U/L (ref 38–126)
Anion gap: 10 (ref 5–15)
BUN: 42 mg/dL — ABNORMAL HIGH (ref 8–23)
CO2: 22 mmol/L (ref 22–32)
Calcium: 8.5 mg/dL — ABNORMAL LOW (ref 8.9–10.3)
Chloride: 104 mmol/L (ref 98–111)
Creatinine, Ser: 1.61 mg/dL — ABNORMAL HIGH (ref 0.61–1.24)
GFR, Estimated: 41 mL/min — ABNORMAL LOW (ref >60)
Glucose, Bld: 130 mg/dL — ABNORMAL HIGH (ref 70–99)
Potassium: 4 mmol/L (ref 3.5–5.1)
Sodium: 136 mmol/L (ref 135–145)
Total Bilirubin: 0.6 mg/dL (ref 0.3–1.2)
Total Protein: 5.5 g/dL — ABNORMAL LOW (ref 6.5–8.1)

## 2020-09-08 LAB — FERRITIN: Ferritin: 161 ng/mL (ref 24–336)

## 2020-09-08 LAB — GLUCOSE, CAPILLARY: Glucose-Capillary: 84 mg/dL (ref 70–99)

## 2020-09-08 LAB — D-DIMER, QUANTITATIVE: D-Dimer, Quant: 1.36 ug/mL-FEU — ABNORMAL HIGH (ref 0.00–0.50)

## 2020-09-08 LAB — C-REACTIVE PROTEIN: CRP: 2 mg/dL — ABNORMAL HIGH (ref <1.0)

## 2020-09-08 MED ORDER — ALBUTEROL SULFATE HFA 108 (90 BASE) MCG/ACT IN AERS: 2.0000 | INHALATION_SPRAY | Freq: Four times a day (QID) | RESPIRATORY_TRACT | 0 refills | Status: DC | PRN

## 2020-09-08 MED ORDER — ASPIRIN 81 MG PO TBEC
81.0000 mg | DELAYED_RELEASE_TABLET | Freq: Every day | ORAL | 0 refills | Status: DC
Start: 2020-09-08 — End: 2021-11-05

## 2020-09-08 MED ORDER — METHYLPREDNISOLONE 4 MG PO TBPK: ORAL_TABLET | ORAL | 0 refills | Status: DC

## 2020-09-08 NOTE — Progress Notes (Signed)
SATURATION QUALIFICATIONS: (This note is used to comply with regulatory documentation for home oxygen)  Patient Saturations on Room Air at Rest = 100%  Patient Saturations on Room Air while Ambulating = 95-99%  (Supplemental O2 not needed during ambulation)

## 2020-09-08 NOTE — Progress Notes (Addendum)
Patient was discharged home by MD order.  Discharge instructions reviewed and given to patient and family. IV DIC; skin intact. Patient was not sent home with any oxygen (not needed).  Patient will be escorted to the car by nurse via wheelchair.

## 2020-09-08 NOTE — Plan of Care (Signed)
Patient is currently resting in bed. VSS. Remains on Room air. Voiding. Patient verbalizing how he wants to go home and is tired of laying around and being in this room.   Problem: Education: Goal: Knowledge of risk factors and measures for prevention of condition will improve Outcome: Progressing   Problem: Coping: Goal: Psychosocial and spiritual needs will be supported Outcome: Progressing   Problem: Respiratory: Goal: Will maintain a patent airway Outcome: Progressing Goal: Complications related to the disease process, condition or treatment will be avoided or minimized Outcome: Progressing   Problem: Education: Goal: Knowledge of General Education information will improve Description: Including pain rating scale, medication(s)/side effects and non-pharmacologic comfort measures Outcome: Progressing   Problem: Health Behavior/Discharge Planning: Goal: Ability to manage health-related needs will improve Outcome: Progressing   Problem: Clinical Measurements: Goal: Ability to maintain clinical measurements within normal limits will improve Outcome: Progressing Goal: Will remain free from infection Outcome: Progressing Goal: Diagnostic test results will improve Outcome: Progressing Goal: Respiratory complications will improve Outcome: Progressing Goal: Cardiovascular complication will be avoided Outcome: Progressing   Problem: Activity: Goal: Risk for activity intolerance will decrease Outcome: Progressing   Problem: Nutrition: Goal: Adequate nutrition will be maintained Outcome: Progressing   Problem: Coping: Goal: Level of anxiety will decrease Outcome: Progressing   Problem: Elimination: Goal: Will not experience complications related to bowel motility Outcome: Progressing Goal: Will not experience complications related to urinary retention Outcome: Progressing   Problem: Pain Managment: Goal: General experience of comfort will improve Outcome:  Progressing   Problem: Safety: Goal: Ability to remain free from injury will improve Outcome: Progressing   Problem: Skin Integrity: Goal: Risk for impaired skin integrity will decrease Outcome: Progressing

## 2020-09-08 NOTE — Progress Notes (Deleted)
You are scheduled for an outpatient Remdesivir infusion at 12pm on Monday 11/29 at [facility]. Please park at [place], as staff will be escorting you through the [place] entrance of the hospital. Appointments take approximately 45 minutes.    The address for the infusion clinic site is:  --GPS address is [place] - the parking is located near [employer] building where you will see  COVID19 Infusion feather banner marking the entrance to parking.   (see photos below)            --Enter into the 2nd entrance where the "wave, flag banner" is at the road. Turn into this 2nd entrance and immediately turn left to park in 1 of the 5 parking spots.   --Please stay in your car and call the desk for assistance inside [phone].   The day of your visit you should: [name] Kitchen Get plenty of rest the night before and drink plenty of water . Eat a light meal/snack before coming and take your medications as prescribed  . Wear warm, comfortable clothes with a shirt that can roll-up over the elbow (will need IV start).  . Wear a mask  . Consider bringing some activity to help pass the time

## 2020-09-08 NOTE — Discharge Summary (Signed)
[name] KWI:[id number] DOB: 10-21-1930 DOA: 09/05/2020  PCP: Susy Frizzle, MD  Admit date: 09/05/2020  Discharge date: 09/08/2020  Admitted From: Home   Disposition:  Home   Recommendations for Outpatient Follow-up:   Follow up with PCP in 1-2 weeks  PCP Please obtain BMP/CBC, 2 view CXR in 1week,  (see Discharge instructions)   PCP Please follow up on the following pending results:    Home Health: PT, RN Equipment/Devices:  None Consultations: None  Discharge Condition: Stable    CODE STATUS: Full    Diet Recommendation: Heart Healthy Low Carb  Diet Order            Diet Carb Modified Fluid consistency: Thin; Room service appropriate? Yes  Diet effective now                  Chief Complaint  Patient presents with  . Shortness of Breath  . Fatigue     Brief history of present illness from the day of admission and additional interim summary    Maurice Esguerra Robertsis a 86 y.o.malewith medical history significant ofdiabetes, hypertension, BPH, hyperlipidemia, hard of hearing, carotid artery occlusion and osteoarthritis who was fully vaccinated against COVID-19 presenting to the ER with weakness cough and shortness of breath, he was diagnosed with acute hypoxic respiratory failure due to COVID-19 pneumonia and admitted to the hospital                                                                  Hospital Course   1.  Acute hypoxic respiratory failure due to COVID-19 pneumonia.  Unfortunately he was not vaccinated, he was treated with IV steroids, Remdesivir and Baricitinib combination, he is now stable and symptom-free on room air, ambulatory pulse ox on room air satisfactory.  Will be given a Medrol Dosepak, will finish his last dose of remdesivir tomorrow in the clinic.  Plan  of care discussed with daughter.  Will be discharged home today.  Recent Labs  Lab 09/05/20 1410 09/05/20 1528 09/05/20 1816 09/05/20 2209 09/06/20 0046 09/07/20 0409 09/08/20 0232  WBC 6.1  --   --  3.3* 2.3* 8.2 9.0  CRP  --   --  6.3*  --  6.5* 4.0* 2.0*  DDIMER  --   --  1.20*  --  1.05* 1.16* 1.36*  PROCALCITON  --   --  <0.10  --   --   --   --   LATICACIDVEN  --   --  1.3  --   --   --   --   AST  --   --   --   --  26 26 27   ALT  --   --   --   --  18 20 22  ALKPHOS  --   --   --   --  55 54 49  BILITOT  --   --   --   --  0.3 0.5 0.6  ALBUMIN  --   --   --   --  2.8* 2.8* 2.7*  SARSCOV2NAA  --  POSITIVE*  --   --   --   --   --       2.  DM type II.  Continue home regimen follow with PCP.  Requested to do Accu-Cheks before every meal at bedtime.  3.  Minimally elevated D-dimer due to inflammation, asked to resume home dose aspirin, of note patient had stopped taking aspirin for some reason in the past.  Will recommend at least 2 weeks of aspirin for now thereafter per PCP.  4.  Essential hypertension.  Resume home medications at home dose upon discharge.  5.  CKD 4.  Creatinine around baseline.    Discharge diagnosis     Principal Problem:   Acute respiratory failure due to COVID-19 [facility]) Active Problems:   HTN (hypertension)   Diabetes mellitus type II, uncontrolled (HCC)   History of CEA (carotid endarterectomy)   AKI (acute kidney injury) ([place])   Hard of hearing    Discharge instructions    Discharge Instructions    Discharge instructions   Complete by: As directed    Follow with Primary MD Susy Frizzle, MD in 7 days   Get CBC, CMP, 2 view Chest X ray -  checked next visit within 1 week by Primary MD    Activity: As tolerated with Full fall precautions use walker/cane & assistance as needed  Disposition Home    Diet: Heart Healthy Low Carb  Accuchecks 4 times/day, Once in AM empty stomach and then before each meal. Log in all results  and show them to your Prim.MD in 3 days. If any glucose reading is under 80 or above 300 call your Prim MD immidiately. Follow Low glucose instructions for glucose under 80 as instructed.   Special Instructions: If you have smoked or chewed Tobacco  in the last 2 yrs please stop smoking, stop any regular Alcohol  and or any Recreational drug use.  On your next visit with your primary care physician please Get Medicines reviewed and adjusted.  Please request your Prim.MD to go over all Hospital Tests and Procedure/Radiological results at the follow up, please get all Hospital records sent to your Prim MD by signing hospital release before you go home.  If you experience worsening of your admission symptoms, develop shortness of breath, life threatening emergency, suicidal or homicidal thoughts you must seek medical attention immediately by calling 911 or calling your MD immediately  if symptoms less severe.  You Must read complete instructions/literature along with all the possible adverse reactions/side effects for all the Medicines you take and that have been prescribed to you. Take any new Medicines after you have completely understood and accpet all the possible adverse reactions/side effects.   Increase activity slowly   Complete by: As directed       Discharge Medications   Allergies as of 09/08/2020      Reactions   Tape Other (See Comments)   PLEASE USE EASY-RELEASE TAPE!!!! THE SKIN IS VERY SENSITIVE!!      Medication List    STOP taking these medications   metFORMIN 1000 MG tablet Commonly known as: GLUCOPHAGE     TAKE these medications   albuterol  108 (90 Base) MCG/ACT inhaler Commonly known as: VENTOLIN HFA Inhale 2 puffs into the lungs every 6 (six) hours as needed for wheezing or shortness of breath.   aspirin 81 MG EC tablet Take 1 tablet (81 mg total) by mouth daily. Swallow whole.   atenolol 50 MG tablet Commonly known as: TENORMIN Take 1 tablet (50 mg  total) by mouth daily.   BLOOD GLUCOSE TEST STRIPS Strp Please dispense as One touch Ultra Blue. Use as directed to monitor FSBS 3x daily. Dx: E11.9.   furosemide 20 MG tablet Commonly known as: LASIX TAKE 1 TABLET BY MOUTH EVERY DAY   glipiZIDE 10 MG 24 hr tablet Commonly known as: GLUCOTROL XL Take 1 tablet (10 mg total) by mouth daily with breakfast. What changed: Another medication with the same name was removed. Continue taking this medication, and follow the directions you see here.   methylPREDNISolone 4 MG Tbpk tablet Commonly known as: MEDROL DOSEPAK follow package directions   pioglitazone 30 MG tablet Commonly known as: ACTOS TAKE 1 TABLET BY MOUTH EVERY DAY   Trulicity 5.36 RW/4.3XV Sopn Generic drug: Dulaglutide INJECT 0.75 MG INTO THE SKIN ONCE A WEEK. What changed: when to take this        Follow-up Information    Susy Frizzle, MD. Schedule an appointment as soon as possible for a visit in 1 week(s).   Specialty: Family Medicine Contact information: [address] [place] [address] [phone]               Major procedures and Radiology Reports - PLEASE review detailed and final reports thoroughly  -       DG Chest 2 View  Result Date: 09/05/2020 CLINICAL DATA:  Wheezing with cough and fatigue. EXAM: CHEST - 2 VIEW COMPARISON:  02/29/2012 FINDINGS: Two-view exam shows patchy ground-glass opacity in both lungs with a mid and lower lung predominance. No pleural effusion. The cardiopericardial silhouette is within normal limits for size. The visualized bony structures of the thorax show no acute abnormality. IMPRESSION: Patchy bilateral ground-glass opacities, compatible with multifocal pneumonia. Electronically Signed   By: Misty Stanley M.D.   On: 09/05/2020 14:24    Micro Results     Recent Results (from the past 240 hour(s))  Resp Panel by RT-PCR (Flu A&B, Covid) Nasopharyngeal Swab     Status: Abnormal   Collection Time:  09/05/20  3:28 PM   Specimen: Nasopharyngeal Swab; Nasopharyngeal(NP) swabs in vial transport medium  Result Value Ref Range Status   SARS Coronavirus 2 by RT PCR POSITIVE (A) NEGATIVE Final    Comment: RESULT CALLED TO, READ BACK BY AND VERIFIED WITH: B BECK RN 1732 09/05/20 A BROWNING (NOTE) SARS-CoV-2 target nucleic acids are DETECTED.  The SARS-CoV-2 RNA is generally detectable in upper respiratory specimens during the acute phase of infection. Positive results are indicative of the presence of the identified virus, but do not rule out bacterial infection or co-infection with other pathogens not detected by the test. Clinical correlation with patient history and other diagnostic information is necessary to determine patient infection status. The expected result is Negative.  Fact Sheet for Patients: EntrepreneurPulse.com.au  Fact Sheet for Healthcare Providers: IncredibleEmployment.be  This test is not yet approved or cleared by the [place] FDA and  has been authorized for detection and/or diagnosis of SARS-CoV-2 by FDA under an Emergency Use Authorization (EUA).  This EUA will remain in effect (meaning this test can b e used) for  the duration of  the COVID-19 declaration under Section 564(b)(1) of the Act, 21 U.S.C. section 360bbb-3(b)(1), unless the authorization is terminated or revoked sooner.     Influenza A by PCR NEGATIVE NEGATIVE Final   Influenza B by PCR NEGATIVE NEGATIVE Final    Comment: (NOTE) The Xpert Xpress SARS-CoV-2/FLU/RSV plus assay is intended as an aid in the diagnosis of influenza from Nasopharyngeal swab specimens and should not be used as a sole basis for treatment. Nasal washings and aspirates are unacceptable for Xpert Xpress SARS-CoV-2/FLU/RSV testing.  Fact Sheet for Patients: EntrepreneurPulse.com.au  Fact Sheet for Healthcare  Providers: IncredibleEmployment.be  This test is not yet approved or cleared by the [place] FDA and has been authorized for detection and/or diagnosis of SARS-CoV-2 by FDA under an Emergency Use Authorization (EUA). This EUA will remain in effect (meaning this test can be used) for the duration of the COVID-19 declaration under Section 564(b)(1) of the Act, 21 U.S.C. section 360bbb-3(b)(1), unless the authorization is terminated or revoked.  Performed at [facility], [place] [address], [place], [place] [address]   Blood Culture (routine x 2)     Status: None (Preliminary result)   Collection Time: 09/05/20  6:17 PM   Specimen: BLOOD RIGHT HAND  Result Value Ref Range Status   Specimen Description BLOOD RIGHT HAND  Final   Special Requests   Final    BOTTLES DRAWN AEROBIC AND ANAEROBIC Blood Culture adequate volume   Culture   Final    NO GROWTH 3 DAYS Performed at [facility], [place] [address] [place], [place] [address]    Report Status PENDING  Incomplete  Blood Culture (routine x 2)     Status: None (Preliminary result)   Collection Time: 09/05/20 10:09 PM   Specimen: BLOOD  Result Value Ref Range Status   Specimen Description BLOOD SITE NOT SPECIFIED  Final   Special Requests   Final    BOTTLES DRAWN AEROBIC AND ANAEROBIC Blood Culture results may not be optimal due to an excessive volume of blood received in culture bottles   Culture   Final    NO GROWTH 3 DAYS Performed at [facility], [place] [address] [place], [place] [address]    Report Status PENDING  Incomplete    Today   Subjective    Merick Kelleher today has no headache,no chest abdominal pain,no new weakness tingling or numbness, feels much better wants to go home today.     Objective   Blood pressure (!) 145/77, pulse 77, temperature 98.1 F (36.7 C), temperature source Oral, resp. rate 20, height 5\' 5"  (1.651 m), weight 79.9 kg, SpO2 93 %.   Intake/Output  Summary (Last 24 hours) at 09/08/2020 0906 Last data filed at 09/08/2020 0225 Gross per 24 hour  Intake 100 ml  Output 1200 ml  Net -1100 ml    Exam  Awake Alert, No new F.N deficits, Normal affect [place].AT,PERRAL Supple Neck,No JVD, No cervical lymphadenopathy appriciated.  Symmetrical Chest wall movement, Good air movement bilaterally, CTAB RRR,No Gallops,Rubs or new Murmurs, No Parasternal Heave +ve B.Sounds, Abd Soft, Non tender, No organomegaly appriciated, No rebound -guarding or rigidity. No Cyanosis, Clubbing or edema, No new Rash or bruise   Data Review   CBC w Diff:  Lab Results  Component Value Date   WBC 9.0 09/08/2020   HGB 12.4 (L) 09/08/2020   HCT 37.0 (L) 09/08/2020   PLT 167 09/08/2020   LYMPHOPCT 12 09/08/2020  MONOPCT 9 09/08/2020   EOSPCT 0 09/08/2020   BASOPCT 0 09/08/2020    CMP:  Lab Results  Component Value Date   NA 136 09/08/2020   K 4.0 09/08/2020   CL 104 09/08/2020   CO2 22 09/08/2020   BUN 42 (H) 09/08/2020   CREATININE 1.61 (H) 09/08/2020   CREATININE 1.81 (H) 06/04/2020   PROT 5.5 (L) 09/08/2020   ALBUMIN 2.7 (L) 09/08/2020   BILITOT 0.6 09/08/2020   ALKPHOS 49 09/08/2020   AST 27 09/08/2020   ALT 22 09/08/2020  .   Total Time in preparing paper work, data evaluation and todays exam - 95 minutes  Lala Lund M.D on 09/08/2020 at 9:06 AM  [facility]   Office  [phone]

## 2020-09-08 NOTE — TOC Transition Note (Signed)
Transition of Care [facility]) - CM/SW Discharge Note   Patient Details  Name: [name] MRN: [id number] Date of Birth: Sep 09, 1931  [facility]) CM/SW Contact:  Pollie Friar, RN Phone Number: 09/08/2020, 9:21 AM   Clinical Narrative:    Pt is discharging home with [facility] services through [facility]. [place] with [facility] accepted the referral. Pt states he lives with his wife and daughter.  Pt states Ronalee Belts is going to provide transport home.   Final next level of care: [place] Barriers to Discharge: No Barriers Identified   Patient Goals and CMS Choice        Discharge Placement                       Discharge Plan and Services                          HH Arranged: PT, RN [facility] Agency: [facility] Date [facility] Agency Contacted: 09/08/20   Representative spoke with at [place]: [place] (SDOH) Interventions     Readmission Risk Interventions No flowsheet data found.

## 2020-09-08 NOTE — Progress Notes (Signed)
Patient scheduled for outpatient Remdesivir infusions at 12pm on Monday 11/29 at [facility]. Please inform the patient to park at [place], as staff will be escorting the patient through the [place] entrance of the hospital. Appointments take approximately 45 minutes.    There is a wave flag banner located near the entrance on [address]. Turn into this entrance and immediately turn left or right and park in 1 of the 10 designated Covid Infusion Parking spots. There is a phone number on the sign, please call and let the staff know what spot you are in and we will come out and get you. For questions call [phone].  Thanks.

## 2020-09-08 NOTE — Discharge Instructions (Signed)
10 Things You Can Do to Manage Your COVID-19 Symptoms at Home If you have possible or confirmed COVID-19: 1. Stay home from work and school. And stay away from other public places. If you must go out, avoid using any kind of public transportation, ridesharing, or taxis. 2. Monitor your symptoms carefully. If your symptoms get worse, call your healthcare provider immediately. 3. Get rest and stay hydrated. 4. If you have a medical appointment, call the healthcare provider ahead of time and tell them that you have or may have COVID-19. 5. For medical emergencies, call 911 and notify the dispatch personnel that you have or may have COVID-19. 6. Cover your cough and sneezes with a tissue or use the inside of your elbow. 7. Wash your hands often with soap and water for at least 20 seconds or clean your hands with an alcohol-based hand sanitizer that contains at least 60% alcohol. 8. As much as possible, stay in a specific room and away from other people in your home. Also, you should use a separate bathroom, if available. If you need to be around other people in or outside of the home, wear a mask. 9. Avoid sharing personal items with other people in your household, like dishes, towels, and bedding. 10. Clean all surfaces that are touched often, like counters, tabletops, and doorknobs. Use household cleaning sprays or wipes according to the label instructions. [email or web address] 04/12/2019 This information is not intended to replace advice given to you by your health care provider. Make sure you discuss any questions you have with your health care provider. Document Revised: 09/14/2019 Document Reviewed: 09/14/2019 Elsevier Patient Education  [place].   Acute Respiratory Failure, Adult  Acute respiratory failure occurs when there is not enough oxygen passing from your lungs to your body. When this happens, your lungs have trouble removing carbon dioxide from the blood. This causes your  blood oxygen level to drop too low as carbon dioxide builds up. Acute respiratory failure is a medical emergency. It can develop quickly, but it is temporary if treated promptly. Your lung capacity, or how much air your lungs can hold, may improve with time, exercise, and treatment. What are the causes? There are many possible causes of acute respiratory failure, including:  Lung injury.  Chest injury or damage to the ribs or tissues near the lungs.  Lung conditions that affect the flow of air and blood into and out of the lungs, such as pneumonia, acute respiratory distress syndrome, and cystic fibrosis.  Medical conditions, such as strokes or spinal cord injuries, that affect the muscles and nerves that control breathing.  Blood infection (sepsis).  Inflammation of the pancreas (pancreatitis).  A blood clot in the lungs (pulmonary embolism).  A large-volume blood transfusion.  Burns.  Near-drowning.  Seizure.  Smoke inhalation.  Reaction to medicines.  Alcohol or drug overdose. What increases the risk? This condition is more likely to develop in people who have:  A blocked airway.  Asthma.  A condition or disease that damages or weakens the muscles, nerves, bones, or tissues that are involved in breathing.  A serious infection.  A health problem that blocks the unconscious reflex that is involved in breathing, such as hypothyroidism or sleep apnea.  A lung injury or trauma. What are the signs or symptoms? Trouble breathing is the main symptom of acute respiratory failure. Symptoms may also include:  Rapid breathing.  Restlessness or anxiety.  Skin, lips, or fingernails that appear blue (cyanosis).  Rapid  heart rate.  Abnormal heart rhythms (arrhythmias).  Confusion or changes in behavior.  Tiredness or loss of energy.  Feeling sleepy or having a loss of consciousness. How is this diagnosed? Your health care provider can diagnose acute respiratory  failure with a medical history and physical exam. During the exam, your health care provider will listen to your heart and check for crackling or wheezing sounds in your lungs. Your may also have tests to confirm the diagnosis and determine what is causing respiratory failure. These tests may include:  Measuring the amount of oxygen in your blood (pulse oximetry). The measurement comes from a small device that is placed on your finger, earlobe, or toe.  Other blood tests to measure blood gases and to look for signs of infection.  Sampling your cerebral spinal fluid or tracheal fluid to check for infections.  Chest X-ray to look for fluid in spaces that should be filled with air.  Electrocardiogram (ECG) to look at the heart's electrical activity. How is this treated? Treatment for this condition usually takes places in a hospital intensive care unit (ICU). Treatment depends on what is causing the condition. It may include one or more treatments until your symptoms improve. Treatment may include:  Supplemental oxygen. Extra oxygen is given through a tube in the nose, a face mask, or a hood.  A device such as a continuous positive airway pressure (CPAP) or bi-level positive airway pressure (BiPAP or BPAP) machine. This treatment uses mild air pressure to keep the airways open. A mask or other device will be placed over your nose or mouth. A tube that is connected to a motor will deliver oxygen through the mask.  Ventilator. This treatment helps move air into and out of the lungs. This may be done with a bag and mask or a machine. For this treatment, a tube is placed in your windpipe (trachea) so air and oxygen can flow to the lungs.  Extracorporeal membrane oxygenation (ECMO). This treatment temporarily takes over the function of the heart and lungs, supplying oxygen and removing carbon dioxide. ECMO gives the lungs a chance to recover. It may be used if a ventilator is not  effective.  Tracheostomy. This is a procedure that creates a hole in the neck to insert a breathing tube.  Receiving fluids and medicines.  Rocking the bed to help breathing. Follow these instructions at home:  Take over-the-counter and prescription medicines only as told by your health care provider.  Return to normal activities as told by your health care provider. Ask your health care provider what activities are safe for you.  Keep all follow-up visits as told by your health care provider. This is important. How is this prevented? Treating infections and medical conditions that may lead to acute respiratory failure can help prevent the condition from developing. Contact a health care provider if:  You have a fever.  Your symptoms do not improve or they get worse. Get help right away if:  You are having trouble breathing.  You lose consciousness.  Your have cyanosis or turn blue.  You develop a rapid heart rate.  You are confused. These symptoms may represent a serious problem that is an emergency. Do not wait to see if the symptoms will go away. Get medical help right away. Call your local emergency services (911 in the [place] Do not drive yourself to the hospital. This information is not intended to replace advice given to you by your health care provider. Make sure  you discuss any questions you have with your health care provider. Document Revised: 09/10/2017 Document Reviewed: 04/15/2016 Elsevier Patient Education  2020 [employer].   COVID-19 Frequently Asked Questions COVID-19 (coronavirus disease) is an infection that is caused by a large family of viruses. Some viruses cause illness in people and others cause illness in animals like camels, cats, and bats. In some cases, the viruses that cause illness in animals can spread to humans. Where did the coronavirus come from? In December 2019, [place] told the [facility]) of several cases of lung  disease (human respiratory illness). These cases were linked to an open seafood and livestock market in the city of [place]. The link to the seafood and livestock market suggests that the virus may have spread from animals to humans. However, since that first outbreak in December, the virus has also been shown to spread from person to person. What is the name of the disease and the virus? Disease name Early on, this disease was called novel coronavirus. This is because scientists determined that the disease was caused by a new (novel) respiratory virus. The World Health Organization [facility]) has now named the disease COVID-19, or coronavirus disease. Virus name The virus that causes the disease is called severe acute respiratory syndrome coronavirus 2 (SARS-CoV-2). More information on disease and virus naming World Health Organization [facility]): www.who.int/emergencies/diseases/novel-coronavirus-2019/technical-guidance/naming-the-coronavirus-disease-(covid-2019)-and-the-virus-that-causes-it Who is at risk for complications from coronavirus disease? Some people may be at higher risk for complications from coronavirus disease. This includes older adults and people who have chronic diseases, such as heart disease, diabetes, and lung disease. If you are at higher risk for complications, take these extra precautions:  Stay home as much as possible.  Avoid social gatherings and travel.  Avoid close contact with others. Stay at least 6 ft (2 m) away from others, if possible.  Wash your hands often with soap and water for at least 20 seconds.  Avoid touching your face, mouth, nose, or eyes.  Keep supplies on hand at home, such as food, medicine, and cleaning supplies.  If you must go out in public, wear a cloth face covering or face mask. Make sure your mask covers your nose and mouth. How does coronavirus disease spread? The virus that causes coronavirus disease spreads easily from person to person (is  contagious). You may catch the virus by:  Breathing in droplets from an infected person. Droplets can be spread by a person breathing, speaking, singing, coughing, or sneezing.  Touching something, like a table or a doorknob, that was exposed to the virus (contaminated) and then touching your mouth, nose, or eyes. Can I get the virus from touching surfaces or objects? There is still a lot that we do not know about the virus that causes coronavirus disease. Scientists are basing a lot of information on what they know about similar viruses, such as:  Viruses cannot generally survive on surfaces for long. They need a human body (host) to survive.  It is more likely that the virus is spread by close contact with people who are sick (direct contact), such as through: ? Shaking hands or hugging. ? Breathing in respiratory droplets that travel through the air. Droplets can be spread by a person breathing, speaking, singing, coughing, or sneezing.  It is less likely that the virus is spread when a person touches a surface or object that has the virus on it (indirect contact). The virus may be able to enter the body if the person touches  a surface or object and then touches his or her face, eyes, nose, or mouth. Can a person spread the virus without having symptoms of the disease? It may be possible for the virus to spread before a person has symptoms of the disease, but this is most likely not the main way the virus is spreading. It is more likely for the virus to spread by being in close contact with people who are sick and breathing in the respiratory droplets spread by a person breathing, speaking, singing, coughing, or sneezing. What are the symptoms of coronavirus disease? Symptoms vary from person to person and can range from mild to severe. Symptoms may include:  Fever or chills.  Cough.  Difficulty breathing or feeling short of breath.  Headaches, body aches, or muscle aches.  Runny or  stuffy (congested) nose.  Sore throat.  New loss of taste or smell.  Nausea, vomiting, or diarrhea. These symptoms can appear anywhere from 2 to 14 days after you have been exposed to the virus. Some people may not have any symptoms. If you develop symptoms, call your health care provider. People with severe symptoms may need hospital care. Should I be tested for this virus? Your health care provider will decide whether to test you based on your symptoms, history of exposure, and your risk factors. How does a health care provider test for this virus? Health care providers will collect samples to send for testing. Samples may include:  Taking a swab of fluid from the back of your nose and throat, your nose, or your throat.  Taking fluid from the lungs by having you cough up mucus (sputum) into a sterile cup.  Taking a blood sample. Is there a treatment or vaccine for this virus? Currently, there is no vaccine to prevent coronavirus disease. Also, there are no medicines like antibiotics or antivirals to treat the virus. A person who becomes sick is given supportive care, which means rest and fluids. A person may also relieve his or her symptoms by using over-the-counter medicines that treat sneezing, coughing, and runny nose. These are the same medicines that a person takes for the common cold. If you develop symptoms, call your health care provider. People with severe symptoms may need hospital care. What can I do to protect myself and my family from this virus?     You can protect yourself and your family by taking the same actions that you would take to prevent the spread of other viruses. Take the following actions:  Wash your hands often with soap and water for at least 20 seconds. If soap and water are not available, use alcohol-based hand sanitizer.  Avoid touching your face, mouth, nose, or eyes.  Cough or sneeze into a tissue, sleeve, or elbow. Do not cough or sneeze into your  hand or the air. ? If you cough or sneeze into a tissue, throw it away immediately and wash your hands.  Disinfect objects and surfaces that you frequently touch every day.  Stay away from people who are sick.  Avoid going out in public, follow guidance from your state and local health authorities.  Avoid crowded indoor spaces. Stay at least 6 ft (2 m) away from others.  If you must go out in public, wear a cloth face covering or face mask. Make sure your mask covers your nose and mouth.  Stay home if you are sick, except to get medical care. Call your health care provider before you get medical care.  Your health care provider will tell you how long to stay home.  Make sure your vaccines are up to date. Ask your health care provider what vaccines you need. What should I do if I need to travel? Follow travel recommendations from your local health authority, the CDC, and WHO. Travel information and advice  Centers for Disease Control and Prevention (CDC): BodyEditor.hu  World Health Organization [facility]): ThirdIncome.ca Know the risks and take action to protect your health  You are at higher risk of getting coronavirus disease if you are traveling to areas with an outbreak or if you are exposed to travelers from areas with an outbreak.  Wash your hands often and practice good hygiene to lower the risk of catching or spreading the virus. What should I do if I am sick? General instructions to stop the spread of infection  Wash your hands often with soap and water for at least 20 seconds. If soap and water are not available, use alcohol-based hand sanitizer.  Cough or sneeze into a tissue, sleeve, or elbow. Do not cough or sneeze into your hand or the air.  If you cough or sneeze into a tissue, throw it away immediately and wash your hands.  Stay home unless you must get medical care. Call  your health care provider or local health authority before you get medical care.  Avoid public areas. Do not take public transportation, if possible.  If you can, wear a mask if you must go out of the house or if you are in close contact with someone who is not sick. Make sure your mask covers your nose and mouth. Keep your home clean  Disinfect objects and surfaces that are frequently touched every day. This may include: ? Counters and tables. ? Doorknobs and light switches. ? Sinks and faucets. ? Electronics such as phones, remote controls, keyboards, computers, and tablets.  Wash dishes in hot, soapy water or use a dishwasher. Air-dry your dishes.  Wash laundry in hot water. Prevent infecting other household members  Let healthy household members care for children and pets, if possible. If you have to care for children or pets, wash your hands often and wear a mask.  Sleep in a different bedroom or bed, if possible.  Do not share personal items, such as razors, toothbrushes, deodorant, combs, brushes, towels, and washcloths. Where to find more information Centers for Disease Control and Prevention (CDC)  Information and news updates: https://www.butler-gonzalez.com/ World Health Organization [facility])  Information and news updates: MissExecutive.com.ee  Coronavirus health topic: https://www.castaneda.info/  Questions and answers on COVID-19: OpportunityDebt.at  Global tracker: who.sprinklr.com American Academy of Pediatrics (AAP)  Information for families: www.healthychildren.org/English/health-issues/conditions/chest-lungs/Pages/2019-Novel-Coronavirus.aspx The coronavirus situation is changing rapidly. Check your local health authority website or the CDC and [facility] websites for updates and news. When should I contact a health care provider?  Contact your health care provider if you have symptoms of an  infection, such as fever or cough, and you: ? Have been near anyone who is known to have coronavirus disease. ? Have come into contact with a person who is suspected to have coronavirus disease. ? Have traveled to an area where there is an outbreak of COVID-19. When should I get emergency medical care?  Get help right away by calling your local emergency services (911 in the [place] if you have: ? Trouble breathing. ? Pain or pressure in your chest. ? Confusion. ? Blue-tinged lips and fingernails. ? Difficulty waking from sleep. ? Symptoms that get worse.  Let the emergency medical personnel know if you think you have coronavirus disease. Summary  A new respiratory virus is spreading from person to person and causing COVID-19 (coronavirus disease).  The virus that causes COVID-19 appears to spread easily. It spreads from one person to another through droplets from breathing, speaking, singing, coughing, or sneezing.  Older adults and those with chronic diseases are at higher risk of disease. If you are at higher risk for complications, take extra precautions.  There is currently no vaccine to prevent coronavirus disease. There are no medicines, such as antibiotics or antivirals, to treat the virus.  You can protect yourself and your family by washing your hands often, avoiding touching your face, and covering your coughs and sneezes. This information is not intended to replace advice given to you by your health care provider. Make sure you discuss any questions you have with your health care provider. Document Revised: 07/28/2019 Document Reviewed: 01/24/2019 Elsevier Patient Education  [place].  3 Key Steps to Take While Waiting for Your COVID-19 Test Result To help stop the spread of COVID-19, take these 3 key steps NOW while waiting for your test results: 1. Stay home and monitor your health. Stay home and monitor your health to help protect your friends, family, and others  from possibly getting COVID-19 from you. Stay home and away from others:  If possible, stay away from others, especially people who are at higher risk for getting very sick from COVID-19, such as older adults and people with other medical conditions.  If you have been in contact with someone with COVID-19, stay home and away from others for 14 days after your last contact with that person.  If you have a fever, cough or other symptoms of COVID-19, stay home and away from others (except to get medical care). Monitor your health:  Watch for fever, cough, shortness of breath, or other symptoms of COVID-19. Remember, symptoms may appear 2-14 days after exposure to COVID-19 and can include: ? Fever or chills ? Cough ? Shortness of breath or difficulty breathing ? Tiredness ? Muscle or body aches ? Headache ? New loss of taste or smell ? Sore throat ? Congestion or runny nose ? Nausea or vomiting ? Diarrhea 2. Think about the people you have recently been around. If you are diagnosed with IRCVE-[id number], a public health worker may call you to check on your health, discuss who you have been around, and ask where you spent time while you may have been able to spread COVID-19 to others. While you wait for your COVID-19 test result, think about everyone you have been around recently. This will be important information to give health workers if your test is positive.  Complete the information on the back of this page to help you remember everyone you have been around. 3. Answer the phone call from the health department. If a public health worker calls you, answer the call to help slow the spread of COVID-19 in your community.  Discussions with health department staff are confidential. This means that your personal and medical information will be kept private and only shared with those who may need to know, like your health care provider.  Your name will not be shared with those you came in contact with.  The health department will only notify people you were in close contact with (within 6 feet for more than 15 minutes) that they might have been exposed to COVID-19. Think about the people you  have recently been around If you test positive and are diagnosed with COVID-19, someone from the health department may call to check-in on your health, discuss who you have been around, and ask where you spent time while you may have been able to spread COVID-19 to others. This form can help you think about people you have recently been around so you will be ready if a public health worker calls you. Things to think about. Have you:  Gone to work or school?  Gotten together with others (eaten out at [employer], gone out for drinks, exercised with others or gone to a gym, had friends or family over to your house, volunteered, gone to a party, pool, or park)?  Gone to a store in person (e.g., grocery store, mall)?  Gone to in-person appointments (e.g., salon, barber, doctor's or dentist's office)?  Ridden in a car with others (e.g., [employer] or [employer]) or took public transportation?  Been inside a church, synagogue, mosque or other places of worship? Who lives with you?  ______________________________________________________________________  ______________________________________________________________________  ______________________________________________________________________  ______________________________________________________________________ Who have you been around (within 6 feet for more than 15 minutes) in the last 10 days? (You may have more people to list than the space provided. If so, write on the front of this sheet or a separate piece of paper.) Name ______________________________________________  Phone number ____________________________________  Date you last saw them _____________________________  Where you last saw them ________________________________________________ Name  ______________________________________________  Phone number ____________________________________  Date you last saw them _____________________________  Where you last saw them ________________________________________________ Name ______________________________________________  Phone number ____________________________________  Date you last saw them _____________________________  Where you last saw them ________________________________________________ Name ______________________________________________  Phone number ____________________________________  Date you last saw them _____________________________  Where you last saw them ________________________________________________ Name ______________________________________________  Phone number ____________________________________  Date you last saw them _____________________________  Where you last saw them ________________________________________________ What have you done in the last 10 days with other people? Activity _____________________________________________  Location _________________________________________  Date ____________________________________________ Activity _____________________________________________  Location _________________________________________  Date ____________________________________________ Activity _____________________________________________  Location _________________________________________  Date ____________________________________________ Activity _____________________________________________  Location _________________________________________  Date ____________________________________________ Activity _____________________________________________  Location _________________________________________  Date ____________________________________________ [email or web address] 05/08/2019 This information is not intended to replace advice given to you by your health care provider.  Make sure you discuss any questions you have with your health care provider. Document Revised: 09/14/2019 Document Reviewed: 09/14/2019 Elsevier Patient Education  2020 [place].   COVID-19 COVID-19 is a respiratory infection that is caused by a virus called severe acute respiratory syndrome coronavirus 2 (SARS-CoV-2). The disease is also known as coronavirus disease or novel coronavirus. In some people, the virus may not cause any symptoms. In others, it may cause a serious infection. The infection can get worse quickly and can lead to complications, such as:  Pneumonia, or infection of the lungs.  Acute respiratory distress syndrome or ARDS. This is a condition in which fluid build-up in the lungs prevents the lungs from filling with air and passing oxygen into the blood.  Acute respiratory failure. This is a condition in which there is not enough oxygen passing from the lungs to the body or when carbon dioxide is not passing from the lungs out of the body.  Sepsis or septic shock. This is a serious bodily reaction to an infection.  Blood clotting problems.  Secondary infections due to bacteria or fungus.  Organ failure. This is when your body's organs  stop working. The virus that causes COVID-19 is contagious. This means that it can spread from person to person through droplets from coughs and sneezes (respiratory secretions). What are the causes? This illness is caused by a virus. You may catch the virus by:  Breathing in droplets from an infected person. Droplets can be spread by a person breathing, speaking, singing, coughing, or sneezing.  Touching something, like a table or a doorknob, that was exposed to the virus (contaminated) and then touching your mouth, nose, or eyes. What increases the risk? Risk for infection You are more likely to be infected with this virus if you:  Are within 6 feet (2 meters) of a person with COVID-19.  Provide care for or live with a person  who is infected with COVID-19.  Spend time in crowded indoor spaces or live in shared housing. Risk for serious illness You are more likely to become seriously ill from the virus if you:  Are 31 years of age or older. The higher your age, the more you are at risk for serious illness.  Live in a nursing home or long-term care facility.  Have cancer.  Have a long-term (chronic) disease such as: ? Chronic lung disease, including chronic obstructive pulmonary disease or asthma. ? A long-term disease that lowers your body's ability to fight infection (immunocompromised). ? Heart disease, including heart failure, a condition in which the arteries that lead to the heart become narrow or blocked (coronary artery disease), a disease which makes the heart muscle thick, weak, or stiff (cardiomyopathy). ? Diabetes. ? Chronic kidney disease. ? Sickle cell disease, a condition in which red blood cells have an abnormal "sickle" shape. ? Liver disease.  Are obese. What are the signs or symptoms? Symptoms of this condition can range from mild to severe. Symptoms may appear any time from 2 to 14 days after being exposed to the virus. They include:  A fever or chills.  A cough.  Difficulty breathing.  Headaches, body aches, or muscle aches.  Runny or stuffy (congested) nose.  A sore throat.  New loss of taste or smell. Some people may also have stomach problems, such as nausea, vomiting, or diarrhea. Other people may not have any symptoms of COVID-19. How is this diagnosed? This condition may be diagnosed based on:  Your signs and symptoms, especially if: ? You live in an area with a COVID-19 outbreak. ? You recently traveled to or from an area where the virus is common. ? You provide care for or live with a person who was diagnosed with COVID-19. ? You were exposed to a person who was diagnosed with COVID-19.  A physical exam.  Lab tests, which may include: ? Taking a sample of  fluid from the back of your nose and throat (nasopharyngeal fluid), your nose, or your throat using a swab. ? A sample of mucus from your lungs (sputum). ? Blood tests.  Imaging tests, which may include, X-rays, CT scan, or ultrasound. How is this treated? At present, there is no medicine to treat COVID-19. Medicines that treat other diseases are being used on a trial basis to see if they are effective against COVID-19. Your health care provider will talk with you about ways to treat your symptoms. For most people, the infection is mild and can be managed at home with rest, fluids, and over-the-counter medicines. Treatment for a serious infection usually takes places in a hospital intensive care unit (ICU). It may include one or more of  the following treatments. These treatments are given until your symptoms improve.  Receiving fluids and medicines through an IV.  Supplemental oxygen. Extra oxygen is given through a tube in the nose, a face mask, or a hood.  Positioning you to lie on your stomach (prone position). This makes it easier for oxygen to get into the lungs.  Continuous positive airway pressure (CPAP) or bi-level positive airway pressure (BPAP) machine. This treatment uses mild air pressure to keep the airways open. A tube that is connected to a motor delivers oxygen to the body.  Ventilator. This treatment moves air into and out of the lungs by using a tube that is placed in your windpipe.  Tracheostomy. This is a procedure to create a hole in the neck so that a breathing tube can be inserted.  Extracorporeal membrane oxygenation (ECMO). This procedure gives the lungs a chance to recover by taking over the functions of the heart and lungs. It supplies oxygen to the body and removes carbon dioxide. Follow these instructions at home: Lifestyle  If you are sick, stay home except to get medical care. Your health care provider will tell you how long to stay home. Call your health care  provider before you go for medical care.  Rest at home as told by your health care provider.  Do not use any products that contain nicotine or tobacco, such as cigarettes, e-cigarettes, and chewing tobacco. If you need help quitting, ask your health care provider.  Return to your normal activities as told by your health care provider. Ask your health care provider what activities are safe for you. General instructions  Take over-the-counter and prescription medicines only as told by your health care provider.  Drink enough fluid to keep your urine pale yellow.  Keep all follow-up visits as told by your health care provider. This is important. How is this prevented?  There is no vaccine to help prevent COVID-19 infection. However, there are steps you can take to protect yourself and others from this virus. To protect yourself:   Do not travel to areas where COVID-19 is a risk. The areas where COVID-19 is reported change often. To identify high-risk areas and travel restrictions, check the CDC travel website: FatFares.com.br  If you live in, or must travel to, an area where COVID-19 is a risk, take precautions to avoid infection. ? Stay away from people who are sick. ? Wash your hands often with soap and water for 20 seconds. If soap and water are not available, use an alcohol-based hand sanitizer. ? Avoid touching your mouth, face, eyes, or nose. ? Avoid going out in public, follow guidance from your state and local health authorities. ? If you must go out in public, wear a cloth face covering or face mask. Make sure your mask covers your nose and mouth. ? Avoid crowded indoor spaces. Stay at least 6 feet (2 meters) away from others. ? Disinfect objects and surfaces that are frequently touched every day. This may include:  Counters and tables.  Doorknobs and light switches.  Sinks and faucets.  Electronics, such as phones, remote controls, keyboards, computers, and  tablets. To protect others: If you have symptoms of COVID-19, take steps to prevent the virus from spreading to others.  If you think you have a COVID-19 infection, contact your health care provider right away. Tell your health care team that you think you may have a COVID-19 infection.  Stay home. Leave your house only to seek medical care.  Do not use public transport.  Do not travel while you are sick.  Wash your hands often with soap and water for 20 seconds. If soap and water are not available, use alcohol-based hand sanitizer.  Stay away from other members of your household. Let healthy household members care for children and pets, if possible. If you have to care for children or pets, wash your hands often and wear a mask. If possible, stay in your own room, separate from others. Use a different bathroom.  Make sure that all people in your household wash their hands well and often.  Cough or sneeze into a tissue or your sleeve or elbow. Do not cough or sneeze into your hand or into the air.  Wear a cloth face covering or face mask. Make sure your mask covers your nose and mouth. Where to find more information  Centers for Disease Control and Prevention: PurpleGadgets.be  World Health Organization: https://www.castaneda.info/ Contact a health care provider if:  You live in or have traveled to an area where COVID-19 is a risk and you have symptoms of the infection.  You have had contact with someone who has COVID-19 and you have symptoms of the infection. Get help right away if:  You have trouble breathing.  You have pain or pressure in your chest.  You have confusion.  You have bluish lips and fingernails.  You have difficulty waking from sleep.  You have symptoms that get worse. These symptoms may represent a serious problem that is an emergency. Do not wait to see if the symptoms will go away. Get medical help right away. Call  your local emergency services (911 in the [place] Do not drive yourself to the hospital. Let the emergency medical personnel know if you think you have COVID-19. Summary  COVID-19 is a respiratory infection that is caused by a virus. It is also known as coronavirus disease or novel coronavirus. It can cause serious infections, such as pneumonia, acute respiratory distress syndrome, acute respiratory failure, or sepsis.  The virus that causes COVID-19 is contagious. This means that it can spread from person to person through droplets from breathing, speaking, singing, coughing, or sneezing.  You are more likely to develop a serious illness if you are 59 years of age or older, have a weak immune system, live in a nursing home, or have chronic disease.  There is no medicine to treat COVID-19. Your health care provider will talk with you about ways to treat your symptoms.  Take steps to protect yourself and others from infection. Wash your hands often and disinfect objects and surfaces that are frequently touched every day. Stay away from people who are sick and wear a mask if you are sick. This information is not intended to replace advice given to you by your health care provider. Make sure you discuss any questions you have with your health care provider. Document Revised: 07/28/2019 Document Reviewed: 11/03/2018 Elsevier Patient Education  2020 [place].  COVID-19: How to Protect Yourself and Others Know how it spreads  There is currently no vaccine to prevent coronavirus disease 2019 (COVID-19).  The best way to prevent illness is to avoid being exposed to this virus.  The virus is thought to spread mainly from person-to-person. ? Between people who are in close contact with one another (within about 6 feet). ? Through respiratory droplets produced when an infected person coughs, sneezes or talks. ? These droplets can land in the mouths or noses of people  who are nearby or possibly be  inhaled into the lungs. ? COVID-19 may be spread by people who are not showing symptoms. Everyone should Clean your hands often  Wash your hands often with soap and water for at least 20 seconds especially after you have been in a public place, or after blowing your nose, coughing, or sneezing.  If soap and water are not readily available, use a hand sanitizer that contains at least 60% alcohol. Cover all surfaces of your hands and rub them together until they feel dry.  Avoid touching your eyes, nose, and mouth with unwashed hands. Avoid close contact  Limit contact with others as much as possible.  Avoid close contact with people who are sick.  Put distance between yourself and other people. ? Remember that some people without symptoms may be able to spread virus. ? This is especially important for people who are at higher risk of getting very GainPain.com.cy Cover your mouth and nose with a mask when around others  You could spread COVID-19 to others even if you do not feel sick.  Everyone should wear a mask in public settings and when around people not living in their household, especially when social distancing is difficult to maintain. ? Masks should not be placed on young children under age 45, anyone who has trouble breathing, or is unconscious, incapacitated or otherwise unable to remove the mask without assistance.  The mask is meant to protect other people in case you are infected.  Do NOT use a facemask meant for a Dietitian.  Continue to keep about 6 feet between yourself and others. The mask is not a substitute for social distancing. Cover coughs and sneezes  Always cover your mouth and nose with a tissue when you cough or sneeze or use the inside of your elbow.  Throw used tissues in the trash.  Immediately wash your hands with soap and water for at least 20 seconds. If soap and water  are not readily available, clean your hands with a hand sanitizer that contains at least 60% alcohol. Clean and disinfect  Clean AND disinfect frequently touched surfaces daily. This includes tables, doorknobs, light switches, countertops, handles, desks, phones, keyboards, toilets, faucets, and sinks. RackRewards.fr  If surfaces are dirty, clean them: Use detergent or soap and water prior to disinfection.  Then, use a household disinfectant. You can see a list of EPA-registered household disinfectants here. [email or web address] 06/14/2019 This information is not intended to replace advice given to you by your health care provider. Make sure you discuss any questions you have with your health care provider. Document Revised: 06/22/2019 Document Reviewed: 04/20/2019 Elsevier Patient Education  [place]. Follow with Primary MD Susy Frizzle, MD in 7 days   Get CBC, CMP, 2 view Chest X ray -  checked next visit within 1 week by Primary MD    Activity: As tolerated with Full fall precautions use walker/cane & assistance as needed  Disposition Home    Diet: Heart Healthy Low Carb  Accuchecks 4 times/day, Once in AM empty stomach and then before each meal. Log in all results and show them to your Prim.MD in 3 days. If any glucose reading is under 80 or above 300 call your Prim MD immidiately. Follow Low glucose instructions for glucose under 80 as instructed.   Special Instructions: If you have smoked or chewed Tobacco  in the last 2 yrs please stop smoking, stop any regular Alcohol  and or any Recreational  drug use.  On your next visit with your primary care physician please Get Medicines reviewed and adjusted.  Please request your Prim.MD to go over all Hospital Tests and Procedure/Radiological results at the follow up, please get all Hospital records sent to your Prim MD by signing hospital release before  you go home.  If you experience worsening of your admission symptoms, develop shortness of breath, life threatening emergency, suicidal or homicidal thoughts you must seek medical attention immediately by calling 911 or calling your MD immediately  if symptoms less severe.  You Must read complete instructions/literature along with all the possible adverse reactions/side effects for all the Medicines you take and that have been prescribed to you. Take any new Medicines after you have completely understood and accpet all the possible adverse reactions/side effects.          Person Under Monitoring Name: [name]  Location: [place] [address]   Infection Prevention Recommendations for Individuals Confirmed to have, or Being Evaluated for, 2019 Novel Coronavirus (COVID-19) Infection Who Receive Care at Home  Individuals who are confirmed to have, or are being evaluated for, COVID-19 should follow the prevention steps below until a healthcare provider or local or state health department says they can return to normal activities.  Stay home except to get medical care You should restrict activities outside your home, except for getting medical care. Do not go to work, school, or public areas, and do not use public transportation or taxis.  Call ahead before visiting your doctor Before your medical appointment, call the healthcare provider and tell them that you have, or are being evaluated for, COVID-19 infection. This will help the healthcare provider's office take steps to keep other people from getting infected. Ask your healthcare provider to call the local or state health department.  Monitor your symptoms Seek prompt medical attention if your illness is worsening (e.g., difficulty breathing). Before going to your medical appointment, call the healthcare provider and tell them that you have, or are being evaluated for, COVID-19  infection. Ask your healthcare provider to call the local or state health department.  Wear a facemask You should wear a facemask that covers your nose and mouth when you are in the same room with other people and when you visit a healthcare provider. People who live with or visit you should also wear a facemask while they are in the same room with you.  Separate yourself from other people in your home As much as possible, you should stay in a different room from other people in your home. Also, you should use a separate bathroom, if available.  Avoid sharing household items You should not share dishes, drinking glasses, cups, eating utensils, towels, bedding, or other items with other people in your home. After using these items, you should wash them thoroughly with soap and water.  Cover your coughs and sneezes Cover your mouth and nose with a tissue when you cough or sneeze, or you can cough or sneeze into your sleeve. Throw used tissues in a lined trash can, and immediately wash your hands with soap and water for at least 20 seconds or use an alcohol-based hand rub.  Wash your [facility] your hands often and thoroughly with soap and water for at least 20 seconds. You can use an alcohol-based hand sanitizer if soap and water are not available and if your hands are not visibly dirty. Avoid touching your eyes, nose, and mouth  with unwashed hands.   Prevention Steps for Caregivers and Household Members of Individuals Confirmed to have, or Being Evaluated for, COVID-19 Infection Being Cared for in the Home  If you live with, or provide care at home for, a person confirmed to have, or being evaluated for, COVID-19 infection please follow these guidelines to prevent infection:  Follow healthcare provider's instructions Make sure that you understand and can help the patient follow any healthcare provider instructions for all care.  Provide for the patient's basic needs You should  help the patient with basic needs in the home and provide support for getting groceries, prescriptions, and other personal needs.  Monitor the patient's symptoms If they are getting sicker, call his or her medical provider and tell them that the patient has, or is being evaluated for, COVID-19 infection. This will help the healthcare provider's office take steps to keep other people from getting infected. Ask the healthcare provider to call the local or state health department.  Limit the number of people who have contact with the patient  If possible, have only one caregiver for the patient.  Other household members should stay in another home or place of residence. If this is not possible, they should stay  in another room, or be separated from the patient as much as possible. Use a separate bathroom, if available.  Restrict visitors who do not have an essential need to be in the home.  Keep older adults, very young children, and other sick people away from the patient Keep older adults, very young children, and those who have compromised immune systems or chronic health conditions away from the patient. This includes people with chronic heart, lung, or kidney conditions, diabetes, and cancer.  Ensure good ventilation Make sure that shared spaces in the home have good air flow, such as from an air conditioner or an opened window, weather permitting.  Wash your hands often  Wash your hands often and thoroughly with soap and water for at least 20 seconds. You can use an alcohol based hand sanitizer if soap and water are not available and if your hands are not visibly dirty.  Avoid touching your eyes, nose, and mouth with unwashed hands.  Use disposable paper towels to dry your hands. If not available, use dedicated cloth towels and replace them when they become wet.  Wear a facemask and gloves  Wear a disposable facemask at all times in the room and gloves when you touch or have  contact with the patient's blood, body fluids, and/or secretions or excretions, such as sweat, saliva, sputum, nasal mucus, vomit, urine, or feces.  Ensure the mask fits over your nose and mouth tightly, and do not touch it during use.  Throw out disposable facemasks and gloves after using them. Do not reuse.  Wash your hands immediately after removing your facemask and gloves.  If your personal clothing becomes contaminated, carefully remove clothing and launder. Wash your hands after handling contaminated clothing.  Place all used disposable facemasks, gloves, and other waste in a lined container before disposing them with other household waste.  Remove gloves and wash your hands immediately after handling these items.  Do not share dishes, glasses, or other household items with the patient  Avoid sharing household items. You should not share dishes, drinking glasses, cups, eating utensils, towels, bedding, or other items with a patient who is confirmed to have, or being evaluated for, COVID-19 infection.  After the person uses these items, you should wash  them thoroughly with soap and water.  Wash laundry thoroughly  Immediately remove and wash clothes or bedding that have blood, body fluids, and/or secretions or excretions, such as sweat, saliva, sputum, nasal mucus, vomit, urine, or feces, on them.  Wear gloves when handling laundry from the patient.  Read and follow directions on labels of laundry or clothing items and detergent. In general, wash and dry with the warmest temperatures recommended on the label.  Clean all areas the individual has used often  Clean all touchable surfaces, such as counters, tabletops, doorknobs, bathroom fixtures, toilets, phones, keyboards, tablets, and bedside tables, every day. Also, clean any surfaces that may have blood, body fluids, and/or secretions or excretions on them.  Wear gloves when cleaning surfaces the patient has come in contact  with.  Use a diluted bleach solution (e.g., dilute bleach with 1 part bleach and 10 parts water) or a household disinfectant with a label that says EPA-registered for coronaviruses. To make a bleach solution at home, add 1 tablespoon of bleach to 1 quart (4 cups) of water. For a larger supply, add  cup of bleach to 1 gallon (16 cups) of water.  Read labels of cleaning products and follow recommendations provided on product labels. Labels contain instructions for safe and effective use of the cleaning product including precautions you should take when applying the product, such as wearing gloves or eye protection and making sure you have good ventilation during use of the product.  Remove gloves and wash hands immediately after cleaning.  Monitor yourself for signs and symptoms of illness Caregivers and household members are considered close contacts, should monitor their health, and will be asked to limit movement outside of the home to the extent possible. Follow the monitoring steps for close contacts listed on the symptom monitoring form.   ? If you have additional questions, contact your local health department or call the epidemiologist on call at [phone] (available 24/7). ? This guidance is subject to change. For the most up-to-date guidance from [facility], please refer to their website: [email or web address]        You are scheduled for an outpatient Remdesivir infusion at 12pm on Monday 11/29 at [facility]. Please park at [place], as staff will be escorting you through the [place] entrance of the hospital. Appointments take approximately 45 minutes.    The address for the infusion clinic site is:  --GPS address is [place] - the parking is located near [employer] building where you will see  COVID19 Infusion feather banner marking the entrance to parking.   (see photos below)             --Enter into the 2nd entrance where the "wave, flag banner" is at the road. Turn into this 2nd entrance and immediately turn left to park in 1 of the 5 parking spots.   --Please stay in your car and call the desk for assistance inside [phone].   The day of your visit you should: [name] Kitchen Get plenty of rest the night before and drink plenty of water . Eat a light meal/snack before coming and take your medications as prescribed  . Wear warm, comfortable clothes with a shirt that can roll-up over the elbow (will need IV start).  . Wear a mask  . Consider bringing some activity to help pass the time

## 2020-09-09 ENCOUNTER — Ambulatory Visit (HOSPITAL_COMMUNITY)
Admit: 2020-09-09 | Discharge: 2020-09-09 | Disposition: A | Discharge status: Home / Self Care | Payer: PPO | Source: Ambulatory Visit | Attending: Pulmonary Disease | Admitting: Pulmonary Disease

## 2020-09-09 DIAGNOSIS — U071 COVID-19: Secondary | ICD-10-CM | POA: Insufficient documentation

## 2020-09-09 DIAGNOSIS — J1289 Other viral pneumonia: Secondary | ICD-10-CM | POA: Insufficient documentation

## 2020-09-09 MED ORDER — FAMOTIDINE IN NACL 20-0.9 MG/50ML-% IV SOLN: 20.0000 mg | Freq: Once | INTRAVENOUS | Status: DC | PRN

## 2020-09-09 MED ORDER — ALBUTEROL SULFATE HFA 108 (90 BASE) MCG/ACT IN AERS: 2.0000 | INHALATION_SPRAY | Freq: Once | RESPIRATORY_TRACT | Status: DC | PRN

## 2020-09-09 MED ORDER — EPINEPHRINE 0.3 MG/0.3ML IJ SOAJ: 0.3000 mg | Freq: Once | INTRAMUSCULAR | Status: DC | PRN

## 2020-09-09 MED ORDER — METHYLPREDNISOLONE SODIUM SUCC 125 MG IJ SOLR: 125.0000 mg | Freq: Once | INTRAMUSCULAR | Status: DC | PRN

## 2020-09-09 MED ORDER — SODIUM CHLORIDE 0.9 % IV SOLN
100.0000 mg | Freq: Once | INTRAVENOUS | Status: AC
  Administered 2020-09-09: 100 mg via INTRAVENOUS

## 2020-09-09 MED ORDER — DIPHENHYDRAMINE HCL 50 MG/ML IJ SOLN: 50.0000 mg | Freq: Once | INTRAMUSCULAR | Status: DC | PRN

## 2020-09-09 MED ORDER — SODIUM CHLORIDE 0.9 % IV SOLN: INTRAVENOUS | Status: DC | PRN

## 2020-09-09 NOTE — Discharge Instructions (Signed)
10 Things You Can Do to Manage Your COVID-19 Symptoms at Home If you have possible or confirmed COVID-19: 1. Stay home from work and school. And stay away from other public places. If you must go out, avoid using any kind of public transportation, ridesharing, or taxis. 2. Monitor your symptoms carefully. If your symptoms get worse, call your healthcare provider immediately. 3. Get rest and stay hydrated. 4. If you have a medical appointment, call the healthcare provider ahead of time and tell them that you have or may have COVID-19. 5. For medical emergencies, call 911 and notify the dispatch personnel that you have or may have COVID-19. 6. Cover your cough and sneezes with a tissue or use the inside of your elbow. 7. Wash your hands often with soap and water for at least 20 seconds or clean your hands with an alcohol-based hand sanitizer that contains at least 60% alcohol. 8. As much as possible, stay in a specific room and away from other people in your home. Also, you should use a separate bathroom, if available. If you need to be around other people in or outside of the home, wear a mask. 9. Avoid sharing personal items with other people in your household, like dishes, towels, and bedding. 10. Clean all surfaces that are touched often, like counters, tabletops, and doorknobs. Use household cleaning sprays or wipes according to the label instructions. [email or web address] 04/12/2019 This information is not intended to replace advice given to you by your health care provider. Make sure you discuss any questions you have with your health care provider. Document Revised: 09/14/2019 Document Reviewed: 09/14/2019 Elsevier Patient Education  [employer].  If you have any questions or concerns after the infusion please call the Advanced Practice Provider on call at [phone]. This number is only intended for your use regarding questions or concerns about the infusion post-treatment  side-effects.  Please do not provide this number to others for use.   If someone you know is interested in receiving treatment please have them call the [place] hotline at [phone].

## 2020-09-09 NOTE — Progress Notes (Signed)
  Diagnosis: COVID-19  Physician: Asencion Noble, MD  Procedure: [facility] Infusion Clinic Med: remdesivir infusion - Provided patient with remdesivir fact sheet for patients, parents and caregivers prior to infusion.  Complications: No immediate complications noted.  Discharge: Discharged home   [name] 09/09/2020 ,

## 2020-09-10 LAB — CULTURE, BLOOD (ROUTINE X 2)
Culture: NO GROWTH
Culture: NO GROWTH
Special Requests: ADEQUATE

## 2020-09-13 DIAGNOSIS — N184 Chronic kidney disease, stage 4 (severe): Secondary | ICD-10-CM | POA: Diagnosis not present

## 2020-09-13 DIAGNOSIS — J1289 Other viral pneumonia: Secondary | ICD-10-CM | POA: Diagnosis not present

## 2020-09-13 DIAGNOSIS — M1710 Unilateral primary osteoarthritis, unspecified knee: Secondary | ICD-10-CM | POA: Diagnosis not present

## 2020-09-13 DIAGNOSIS — Z87891 Personal history of nicotine dependence: Secondary | ICD-10-CM | POA: Diagnosis not present

## 2020-09-13 DIAGNOSIS — D696 Thrombocytopenia, unspecified: Secondary | ICD-10-CM | POA: Diagnosis not present

## 2020-09-13 DIAGNOSIS — U071 COVID-19: Secondary | ICD-10-CM | POA: Diagnosis not present

## 2020-09-13 DIAGNOSIS — E785 Hyperlipidemia, unspecified: Secondary | ICD-10-CM | POA: Diagnosis not present

## 2020-09-13 DIAGNOSIS — E1122 Type 2 diabetes mellitus with diabetic chronic kidney disease: Secondary | ICD-10-CM | POA: Diagnosis not present

## 2020-09-13 DIAGNOSIS — J9691 Respiratory failure, unspecified with hypoxia: Secondary | ICD-10-CM | POA: Diagnosis not present

## 2020-09-13 DIAGNOSIS — I129 Hypertensive chronic kidney disease with stage 1 through stage 4 chronic kidney disease, or unspecified chronic kidney disease: Secondary | ICD-10-CM | POA: Diagnosis not present

## 2020-09-13 DIAGNOSIS — H9192 Unspecified hearing loss, left ear: Secondary | ICD-10-CM | POA: Diagnosis not present

## 2020-09-13 DIAGNOSIS — Z794 Long term (current) use of insulin: Secondary | ICD-10-CM | POA: Diagnosis not present

## 2020-09-13 DIAGNOSIS — N400 Enlarged prostate without lower urinary tract symptoms: Secondary | ICD-10-CM | POA: Diagnosis not present

## 2020-09-13 DIAGNOSIS — Z7982 Long term (current) use of aspirin: Secondary | ICD-10-CM | POA: Diagnosis not present

## 2020-09-13 DIAGNOSIS — Z9181 History of falling: Secondary | ICD-10-CM | POA: Diagnosis not present

## 2020-09-18 ENCOUNTER — Other Ambulatory Visit: Payer: Self-pay | Admitting: Family Medicine

## 2020-09-19 DIAGNOSIS — J9691 Respiratory failure, unspecified with hypoxia: Secondary | ICD-10-CM | POA: Diagnosis not present

## 2020-09-19 DIAGNOSIS — M1710 Unilateral primary osteoarthritis, unspecified knee: Secondary | ICD-10-CM | POA: Diagnosis not present

## 2020-09-19 DIAGNOSIS — I129 Hypertensive chronic kidney disease with stage 1 through stage 4 chronic kidney disease, or unspecified chronic kidney disease: Secondary | ICD-10-CM | POA: Diagnosis not present

## 2020-09-19 DIAGNOSIS — J1289 Other viral pneumonia: Secondary | ICD-10-CM | POA: Diagnosis not present

## 2020-09-19 DIAGNOSIS — U071 COVID-19: Secondary | ICD-10-CM | POA: Diagnosis not present

## 2020-09-19 DIAGNOSIS — Z87891 Personal history of nicotine dependence: Secondary | ICD-10-CM | POA: Diagnosis not present

## 2020-09-19 DIAGNOSIS — D696 Thrombocytopenia, unspecified: Secondary | ICD-10-CM | POA: Diagnosis not present

## 2020-09-19 DIAGNOSIS — N400 Enlarged prostate without lower urinary tract symptoms: Secondary | ICD-10-CM | POA: Diagnosis not present

## 2020-09-19 DIAGNOSIS — E1122 Type 2 diabetes mellitus with diabetic chronic kidney disease: Secondary | ICD-10-CM | POA: Diagnosis not present

## 2020-09-19 DIAGNOSIS — E785 Hyperlipidemia, unspecified: Secondary | ICD-10-CM | POA: Diagnosis not present

## 2020-09-19 DIAGNOSIS — Z9181 History of falling: Secondary | ICD-10-CM | POA: Diagnosis not present

## 2020-09-19 DIAGNOSIS — Z7982 Long term (current) use of aspirin: Secondary | ICD-10-CM | POA: Diagnosis not present

## 2020-09-19 DIAGNOSIS — Z794 Long term (current) use of insulin: Secondary | ICD-10-CM | POA: Diagnosis not present

## 2020-09-19 DIAGNOSIS — H9192 Unspecified hearing loss, left ear: Secondary | ICD-10-CM | POA: Diagnosis not present

## 2020-09-19 DIAGNOSIS — N184 Chronic kidney disease, stage 4 (severe): Secondary | ICD-10-CM | POA: Diagnosis not present

## 2020-09-28 ENCOUNTER — Other Ambulatory Visit: Payer: Self-pay | Admitting: Family Medicine

## 2020-10-09 ENCOUNTER — Ambulatory Visit (INDEPENDENT_AMBULATORY_CARE_PROVIDER_SITE_OTHER): Payer: PPO | Admitting: Nurse Practitioner

## 2020-10-09 ENCOUNTER — Other Ambulatory Visit: Payer: Self-pay

## 2020-10-09 VITALS — BP 138/84 | HR 95 | Temp 97.7°F | Ht 65.0 in | Wt 177.0 lb

## 2020-10-09 DIAGNOSIS — K5900 Constipation, unspecified: Secondary | ICD-10-CM

## 2020-10-09 MED ORDER — BISACODYL 10 MG RE SUPP: 10.0000 mg | RECTAL | 0 refills | Status: DC | PRN

## 2020-10-09 MED ORDER — DOCUSATE SODIUM 100 MG PO CAPS: 100.0000 mg | ORAL_CAPSULE | Freq: Two times a day (BID) | ORAL | 0 refills | Status: DC

## 2020-10-09 NOTE — Progress Notes (Signed)
Subjective:    Patient ID: [name], male    DOB: May 07, 1931, 86 y.o.   MRN: [id number]  HPI: [name] is a 86 y.o. male presenting with grandson for constipation.  Chief Complaint  Patient presents with  . Constipation    For 2 wks constipation, taking exlax, prune juice and miralax. Very painful, no stomach pain   CONSTIPATION Duration: 2 weeks Pain: no abdominal pain; pain with trying to make a BM Severity: moderate Aggravating factors: nothing Alleviating factors: nothing History of similar: does not remember Blood in stool: no Treatments attempted: prune juice, Miralax, Ex-lax  Allergies  Allergen Reactions  . Tape Other (See Comments)    PLEASE USE EASY-RELEASE TAPE!!!! THE SKIN IS VERY SENSITIVE!!    Outpatient Encounter Medications as of 10/09/2020  Medication Sig  . albuterol (VENTOLIN HFA) 108 (90 Base) MCG/ACT inhaler Inhale 2 puffs into the lungs every 6 (six) hours as needed for wheezing or shortness of breath.  [name] Kitchen aspirin 81 MG EC tablet Take 1 tablet (81 mg total) by mouth daily. Swallow whole.  [name] Kitchen atenolol (TENORMIN) 50 MG tablet TAKE 1 TABLET BY MOUTH EVERY DAY  . bisacodyl (DULCOLAX) 10 MG suppository Place 1 suppository (10 mg total) rectally as needed for moderate constipation.  . docusate sodium (COLACE) 100 MG capsule Take 1 capsule (100 mg total) by mouth 2 (two) times daily.  . furosemide (LASIX) 20 MG tablet TAKE 1 TABLET BY MOUTH EVERY DAY  . glipiZIDE (GLUCOTROL XL) 10 MG 24 hr tablet Take 1 tablet (10 mg total) by mouth daily with breakfast.  . Glucose Blood (BLOOD GLUCOSE TEST STRIPS) STRP Please dispense as One touch Ultra Blue. Use as directed to monitor FSBS 3x daily. Dx: E11.9.  . methylPREDNISolone (MEDROL DOSEPAK) 4 MG TBPK tablet follow package directions  . pioglitazone (ACTOS) 30 MG tablet TAKE 1 TABLET BY MOUTH EVERY DAY (Patient taking differently: Take 30 mg by mouth daily.)  . TRULICITY 2.02 RK/2.7CW SOPN  INJECT 0.75 MG INTO THE SKIN ONCE A WEEK. (Patient taking differently: Inject 0.75 mg into the skin every Sunday.)   No facility-administered encounter medications on file as of 10/09/2020.    Patient Active Problem List   Diagnosis Date Noted  . Constipation 10/09/2020  . Acute respiratory failure due to COVID-19 ([place]) 09/05/2020  . AKI (acute kidney injury) ([place]) 09/05/2020  . Hard of hearing 09/05/2020  . Infection of skin of neck 06/26/2020  . Aftercare following surgery of the circulatory system, [place] 05/15/2014  . Occlusion and stenosis of carotid artery without mention of cerebral infarction 02/23/2012  . HTN (hypertension) 01/13/2011  . Diabetes mellitus type II, uncontrolled ([place]) 01/13/2011  . BPH (benign prostatic hyperplasia) 01/13/2011  . ASCVD (arteriosclerotic cardiovascular disease) 01/13/2011  . History of CEA (carotid endarterectomy) 01/13/2011  . Tobacco abuse, 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    Past Medical History:  Diagnosis Date  . Arthritis    knee  . Carotid artery occlusion   . Diabetes mellitus    metformin,januvia,and glipizide daily  . Enlarged prostate    pt states no trouble with it  . Hyperlipidemia    takes Simvastatin daily  . Hypertension    takes Atenolol and Lisinopril daily  . Impaired hearing, left    but doesn't wear hearing aids  . Seasonal allergies     Relevant past medical, surgical, family and social history reviewed and updated as indicated. Interim medical history since our last visit reviewed.  Review  of Systems  Constitutional: Negative.  Negative for fever.  Gastrointestinal: Positive for constipation. Negative for abdominal pain, blood in stool, diarrhea, nausea, rectal pain and vomiting.  Skin: Negative.   Neurological: Negative.   Psychiatric/Behavioral: Negative.     Per HPI unless specifically indicated above     Objective:    BP 138/84   Pulse 95   Temp 97.7 F (36.5 C)   Ht 5\' 5"  (1.651 m)   Wt 177 lb (80.3  kg)   SpO2 96%   BMI 29.45 kg/m   Wt Readings from Last 3 Encounters:  10/09/20 177 lb (80.3 kg)  09/08/20 176 lb 2.4 oz (79.9 kg)  07/12/20 177 lb 9.6 oz (80.6 kg)    Physical Exam Vitals and nursing note reviewed. Exam conducted with a chaperone present.  Constitutional:      General: He is not in acute distress.    Appearance: Normal appearance. He is not toxic-appearing.  Abdominal:     General: Abdomen is flat. Bowel sounds are normal. There is no distension.     Tenderness: There is no abdominal tenderness. There is no right CVA tenderness, left CVA tenderness or guarding.  Genitourinary:    Prostate: Normal.     Rectum: Normal. No mass, tenderness, external hemorrhoid or internal hemorrhoid. Normal anal tone.  Skin:    General: Skin is warm and dry.     Coloration: Skin is not jaundiced or pale.     Findings: No erythema.  Neurological:     Mental Status: He is alert and oriented to person, place, and time.     Comments: Hard of hearing at baseline  Psychiatric:        Mood and Affect: Mood normal.        Behavior: Behavior normal.       Assessment & Plan:   Problem List Items Addressed This Visit      Other   Constipation - Primary    Acute, ongoing.  No red flags in history or on examination.  No stool palpated in rectal vault today.  Will start twice daily stool softener and 1x dose of Dulcolax for constipation.  Encouraged to drink plenty of water and eat diet high in fiber.  Return to clinic in 1 week if no relief.       Relevant Medications   docusate sodium (COLACE) 100 MG capsule   bisacodyl (DULCOLAX) 10 MG suppository       Follow up plan: Return if symptoms worsen or fail to improve.

## 2020-10-09 NOTE — Assessment & Plan Note (Addendum)
Acute, ongoing.  No red flags in history or on examination.  No stool palpated in rectal vault today.  Will start twice daily stool softener and 1x dose of Dulcolax for constipation.  Encouraged to drink plenty of water and eat diet high in fiber.  Return to clinic in 1 week if no relief.

## 2020-10-09 NOTE — Patient Instructions (Signed)

## 2020-10-17 ENCOUNTER — Telehealth: Payer: Self-pay

## 2020-10-17 NOTE — Telephone Encounter (Signed)
Needs different medicine. The docusate 100 mg is making him nausea and will not take this med anymore.  Pharmacy:  [facility]

## 2020-10-18 NOTE — Telephone Encounter (Signed)
Call placed to patient. No answer. No VM.  

## 2020-10-18 NOTE — Telephone Encounter (Signed)
I don't think the docusate is causing nausea.  It could be the trulicity.  However, he could try stopping docusate and replace with miralax 17 g poqday for constipation.  But if nausea persists, may need to change the trulicity.

## 2020-10-21 NOTE — Telephone Encounter (Signed)
Call placed to patient. LMTRC.  

## 2020-10-22 NOTE — Telephone Encounter (Signed)
Multiple calls placed to patient with no answer and no return call.   Message to be closed.  

## 2020-10-25 ENCOUNTER — Encounter: Payer: Self-pay | Admitting: Physician Assistant

## 2020-11-11 ENCOUNTER — Ambulatory Visit (INDEPENDENT_AMBULATORY_CARE_PROVIDER_SITE_OTHER): Payer: PPO | Admitting: Physician Assistant

## 2020-11-11 ENCOUNTER — Encounter: Payer: Self-pay | Admitting: Physician Assistant

## 2020-11-11 ENCOUNTER — Other Ambulatory Visit: Payer: Self-pay

## 2020-11-11 VITALS — BP 154/70 | HR 90 | Ht 63.0 in | Wt 176.0 lb

## 2020-11-11 DIAGNOSIS — K5900 Constipation, unspecified: Secondary | ICD-10-CM | POA: Diagnosis not present

## 2020-11-11 MED ORDER — LINACLOTIDE 145 MCG PO CAPS: 145.0000 ug | ORAL_CAPSULE | Freq: Every day | ORAL | 2 refills | Status: DC

## 2020-11-11 NOTE — Progress Notes (Signed)
Chief Complaint: Chronic constipation  HPI:    [name] is an 87 year old Caucasian male with a past medical history as listed below, who was referred to me by Susy Frizzle, MD for a complaint of chronic constipation.      10/09/2020 patient saw PCP and discuss constipation.  Apparently at that time had been occurring for 2 weeks and was taking Ex-Lax prune juice and MiraLAX.  That time rectal exam revealed no stool in the rectal vault.  He was started on twice daily stool softener and 1 Dulcolax suppository.    Today, patient presents to clinic accompanied by his daughter who does assist with history and exam as he is very hard of hearing.  He explains that for the past month or so he has had trouble with bowel movements, even when he "takes the medicine they gave me", which his daughter tells me is a Dulcolax daily or MiraLAX daily, he still does not have a bowel movement but once every 2 to 3 days and when he does, his wife apparently reports that there is a lot of "grunting and groaning and straining".  Denies previous colonoscopy, abdominal pain or bloating when constipated.    Denies fever, chills or blood in the stool.  Past Medical History:  Diagnosis Date  . Arthritis    knee  . Carotid artery occlusion   . Diabetes mellitus    metformin,januvia,and glipizide daily  . Enlarged prostate    pt states no trouble with it  . Hyperlipidemia    takes Simvastatin daily  . Hypertension    takes Atenolol and Lisinopril daily  . Impaired hearing, left    but doesn't wear hearing aids  . Seasonal allergies     Past Surgical History:  Procedure Laterality Date  . APPENDECTOMY     as a child  . CAROTID ANGIOGRAM N/A 05/09/2013   Procedure: CAROTID ANGIOGRAM;  Surgeon: Serafina Mitchell, MD;  Location: [facility] CATH LAB;  Service: Cardiovascular;  Laterality: N/A;  . CAROTID ENDARTERECTOMY Right 2008    Re-do Mar 04, 2012  . carotidendartectomy  2008   right side  . ENDARTERECTOMY   03/04/2012   Procedure: ENDARTERECTOMY CAROTID;  Surgeon: Rosetta Posner, MD;  Location: [facility] OR;  Service: Vascular;  Laterality: Right;  Redo Right Carotid Endarterectomy with hemasheild patch angioplasty  . ENDARTERECTOMY Right 06/26/2020   Procedure: REDO CAROTID ENDARTERECTOMY;  Surgeon: Rosetta Posner, MD;  Location: [facility];  Service: Vascular;  Laterality: Right;  . WOUND EXPLORATION Right 06/26/2020   Procedure: EXPLORATION OF RIGHT NECK INCISION;  Surgeon: Rosetta Posner, MD;  Location: [facility] OR;  Service: Vascular;  Laterality: Right;    Current Outpatient Medications  Medication Sig Dispense Refill  . albuterol (VENTOLIN HFA) 108 (90 Base) MCG/ACT inhaler Inhale 2 puffs into the lungs every 6 (six) hours as needed for wheezing or shortness of breath. 6.7 g 0  . aspirin 81 MG EC tablet Take 1 tablet (81 mg total) by mouth daily. Swallow whole. 30 tablet 0  . atenolol (TENORMIN) 50 MG tablet TAKE 1 TABLET BY MOUTH EVERY DAY 90 tablet 1  . bisacodyl (DULCOLAX) 10 MG suppository Place 1 suppository (10 mg total) rectally as needed for moderate constipation. 12 suppository 0  . docusate sodium (COLACE) 100 MG capsule Take 1 capsule (100 mg total) by mouth 2 (two) times daily. 20 capsule 0  . furosemide (LASIX) 20 MG tablet TAKE 1 TABLET BY MOUTH EVERY DAY 90  tablet 3  . glipiZIDE (GLUCOTROL XL) 10 MG 24 hr tablet Take 1 tablet (10 mg total) by mouth daily with breakfast. 90 tablet 3  . Glucose Blood (BLOOD GLUCOSE TEST STRIPS) STRP Please dispense as One touch Ultra Blue. Use as directed to monitor FSBS 3x daily. Dx: E11.9. 100 each 4  . pioglitazone (ACTOS) 30 MG tablet TAKE 1 TABLET BY MOUTH EVERY DAY (Patient taking differently: Take 30 mg by mouth daily.) 90 tablet 0  . TRULICITY [id number] 0000000 SOPN INJECT 0.75 MG INTO THE SKIN ONCE A WEEK. (Patient taking differently: Inject 0.75 mg into the skin every Sunday.) 2 mL 11   No current facility-administered medications for this visit.    Allergies  as of 11/11/2020 - Review Complete 11/11/2020  Allergen Reaction Noted  . Tape Other (See Comments) 09/05/2020    Family History  Problem Relation Age of Onset  . Cancer Mother        Leukemia  . Leukemia Mother   . Asthma Father   . Anesthesia problems Neg Hx   . Hypotension Neg Hx   . Malignant hyperthermia Neg Hx   . Pseudochol deficiency Neg Hx     Social History   Socioeconomic History  . Marital status: Married    Spouse name: Not on file  . Number of children: Not on file  . Years of education: Not on file  . Highest education level: Not on file  Occupational History  . Not on file  Tobacco Use  . Smoking status: Former Smoker    Years: 25.00    Types: Cigarettes    Quit date: 10/13/1991    Years since quitting: 29.1  . Smokeless tobacco: Never Used  Vaping Use  . Vaping Use: Never used  Substance and Sexual Activity  . Alcohol use: No    Comment: occassional glass of wine  . Drug use: No  . Sexual activity: Yes  Other Topics Concern  . Not on file  Social History Narrative  . Not on file   Social Determinants of Health   Financial Resource Strain: Not on file  Food Insecurity: Not on file  Transportation Needs: Not on file  Physical Activity: Not on file  Stress: Not on file  Social Connections: Not on file  Intimate Partner Violence: Not on file    Review of Systems:    Constitutional: No weight loss, fever or chills Skin: No rash  Cardiovascular: No chest pain Respiratory: No SOB  Gastrointestinal: See HPI and otherwise negative Genitourinary: No dysuria  Neurological: No headache, dizziness or syncope Musculoskeletal: No new muscle or joint pain Hematologic: No bleeding  Psychiatric: No history of depression or anxiety   Physical Exam:  Vital signs: BP (!) 154/70   Pulse 90   Ht '5\' 3"'$  (1.6 m)   Wt 176 lb (79.8 kg)   BMI 31.18 kg/m   Constitutional:   Pleasant Elderly Caucasian male appears to be in NAD, Well developed, Well  nourished, alert and cooperative Head:  Normocephalic and atraumatic. Eyes:   PEERL, EOMI. No icterus. Conjunctiva pink. Ears:  Very HOH Neck:  Supple Throat: Oral cavity and pharynx without inflammation, swelling or lesion.  Respiratory: Respirations even and unlabored. Lungs clear to auscultation bilaterally.   No wheezes, crackles, or rhonchi.  Cardiovascular: Normal S1, S2. No MRG. Regular rate and rhythm. No peripheral edema, cyanosis or pallor.  Gastrointestinal:  Soft, nondistended, nontender. No rebound or guarding. Normal bowel sounds. No appreciable masses or hepatomegaly.  Rectal:  Not performed.  Msk:  Symmetrical without gross deformities. Without edema, no deformity or joint abnormality.  Neurologic:  Alert and  oriented x4;  grossly normal neurologically.  Skin:   Dry and intact without significant lesions or rashes. Psychiatric:  Demonstrates good judgement and reason without abnormal affect or behaviors.  RELEVANT LABS AND IMAGING: CBC    Component Value Date/Time   WBC 9.0 09/08/2020 0232   RBC 4.07 (L) 09/08/2020 0232   HGB 12.4 (L) 09/08/2020 0232   HCT 37.0 (L) 09/08/2020 0232   PLT 167 09/08/2020 0232   MCV 90.9 09/08/2020 0232   MCH 30.5 09/08/2020 0232   MCHC 33.5 09/08/2020 0232   RDW 13.3 09/08/2020 0232   LYMPHSABS 1.1 09/08/2020 0232   MONOABS 0.8 09/08/2020 0232   EOSABS 0.0 09/08/2020 0232   BASOSABS 0.0 09/08/2020 0232    CMP     Component Value Date/Time   NA 136 09/08/2020 0232   K 4.0 09/08/2020 0232   CL 104 09/08/2020 0232   CO2 22 09/08/2020 0232   GLUCOSE 130 (H) 09/08/2020 0232   BUN 42 (H) 09/08/2020 0232   CREATININE 1.61 (H) 09/08/2020 0232   CREATININE 1.81 (H) 06/04/2020 1535   CALCIUM 8.5 (L) 09/08/2020 0232   PROT 5.5 (L) 09/08/2020 0232   ALBUMIN 2.7 (L) 09/08/2020 0232   AST 27 09/08/2020 0232   ALT 22 09/08/2020 0232   ALKPHOS 49 09/08/2020 0232   BILITOT 0.6 09/08/2020 0232   GFRNONAA 41 (L) 09/08/2020 0232    GFRNONAA 33 (L) 06/04/2020 1535   GFRAA 33 (L) 06/27/2020 0434   GFRAA 38 (L) 06/04/2020 1535    Assessment: 1.  Constipation: For the past month or more, no real help from Dulcolax or MiraLAX  Plan: 1.  Discussed with patient that due to this change in bowel habits and the fact that he has never had a colonoscopy this would be indicated.  He declines for now and would like to try medication first.  He is aware of risks of delayed diagnosis of colon cancer. 2.  Started the patient on Linzess 145 mcg daily, 30 minutes before breakfast #30 with 5 refills.  Also provided him with some samples.  Discussed that hopefully he can stop his other laxative medications when using this. 3.  Explained that the dose above is in the middle and we can go up or down as needed.  His daughter verbalized understanding. 4.  Patient to follow in clinic with [place] as needed if medication is not helpful.  He was assigned to Dr. Henrene Pastor this morning.  Ellouise Newer, PA-C [facility] 11/11/2020, 10:24 AM  Cc: Susy Frizzle, MD

## 2020-11-11 NOTE — Progress Notes (Signed)
Assessment and plan noted ?

## 2020-11-11 NOTE — Patient Instructions (Signed)
If you are age 85 or older, your body mass index should be between 23-30. Your Body mass index is 31.18 kg/m. If this is out of the aforementioned range listed, please consider follow up with your Primary Care Provider.  If you are age 32 or younger, your body mass index should be between 19-25. Your Body mass index is 31.18 kg/m. If this is out of the aformentioned range listed, please consider follow up with your Primary Care Provider.   Medication Samples have been provided to the patient.  Drug name: Linzess       Strength: 143mg        Qty: 12  LOT:[id number]  Exp.Date: 02-2021  Dosing instructions: 1 a day by mouth  The patient has been instructed regarding the correct time, dose, and frequency of taking this medication, including desired effects and most common side effects.   It was a pleasure to see you today!

## 2020-11-18 ENCOUNTER — Telehealth: Payer: Self-pay | Admitting: Physician Assistant

## 2020-11-18 NOTE — Telephone Encounter (Signed)
Spoke with patient's daughter, she reports that patient had a small bowel movement about four days ago, she states that he is taking Linzess 145 mcg daily as directed but not getting any relief, daughter states that patient stopped Miralax after office visit, denies any abdominal pain or other symptoms at this time. Daughter states that patient is concerned. Please advise, thank you.

## 2020-11-18 NOTE — Telephone Encounter (Signed)
Patients daughter called states the medication given is not helping the patient and he has not had a BM for a few days now. She also mentioned if he would still be ok to go ahead and schedule a colonoscopy

## 2020-11-18 NOTE — Telephone Encounter (Signed)
Please have patient increase to 2 Linzess, a total of 290 mcg for the next week, if he does well on this we can send in a new prescription for him.  They could also come in and pick up a sample if they do not have enough medicine at home to do this.  Thanks-JLL

## 2020-11-18 NOTE — Telephone Encounter (Signed)
Spoke with patient's daughter in regards to Jennifer's recommendations. She is aware that patient will need to increase Linzess to 2 tablets (total 290 mcg) daily for 1 week and we can see how well he does on this regimen. Advised daughter to give [place] a call with an update after 1 week. Daughter states that she will let us know if she needs any samples. Daughter wanted to know when the constipation would become serious problem, advised that if the patient develops severe abdominal pin and is not passing any amount of stool, advised that passing some stool is better than none.  Daughter verbalized understanding of all information and had no concerns at the end of the call.

## 2020-11-19 NOTE — Telephone Encounter (Signed)
Spoke with patient's daughter, she states that the patient did an enema yesterday and one dose of Miralax, along with 2 tablets of Linzess and has had no results. Daughter states that patient was told not to take anything along with the Linzess so he hadn't been. Advised daughter that it can take a couple of days since we just increased the Linzess yesterday. She states that the patient is starting to become desperate and is having some abdominal pain. Patient is wanting to be scheduled for a colonoscopy. Please advise, thank you.

## 2020-11-19 NOTE — Telephone Encounter (Signed)
Patients wife calling to advise that the patient has doubled up on medication and still has not been able to have a BM

## 2020-11-20 NOTE — Telephone Encounter (Signed)
Spoke with patient's daughter and advised of Jennifer's recommendations. Advised that they will need to mix 7 capfuls of Miralax in a 32 oz bottle of Gatorade and have patient drink 1 cup every 15 minutes until gone. She is aware that he will need to continue Linzess 290 mcg daily. Maudie Mercury is aware that she will need to call us with an update in 1 week, she is aware that if there has been no improvement then they can discuss colonoscopy. Kim verbalized understanding of all information and had no concerns at the end of the call.

## 2020-11-20 NOTE — Telephone Encounter (Signed)
Please have them go ahead and do Miralax prep- then continue Linzess at 290 mcg qd.  Let us know how he does after another week of this. If no change then we can discuss colonoscopy.  Thanks-JLL

## 2020-11-24 ENCOUNTER — Other Ambulatory Visit: Payer: Self-pay | Admitting: Family Medicine

## 2020-11-26 ENCOUNTER — Telehealth: Payer: Self-pay

## 2020-11-26 NOTE — Telephone Encounter (Signed)
Spoke with patient's daughter Maudie Mercury, she states that we were right and it did take a few days for the medication to work for patient's constipation but she states "so far so good". Maudie Mercury states that patient is having bowel movements now. Advised her to call if they had any further concerns. Kim verbalized understanding and had no further concerns.

## 2020-11-26 NOTE — Telephone Encounter (Signed)
-----   Message from Yevette Edwards, [place] sent at 11/18/2020 10:32 AM EST ----- Regarding: Symptom update Follow up on symptoms, see 11/18/20 telephone encounter.

## 2020-11-29 ENCOUNTER — Ambulatory Visit (INDEPENDENT_AMBULATORY_CARE_PROVIDER_SITE_OTHER): Payer: PPO | Admitting: Family Medicine

## 2020-11-29 ENCOUNTER — Other Ambulatory Visit: Payer: Self-pay

## 2020-11-29 ENCOUNTER — Encounter: Payer: Self-pay | Admitting: Family Medicine

## 2020-11-29 VITALS — BP 142/84 | HR 78 | Temp 98.2°F | Resp 16 | Ht 63.0 in | Wt 180.0 lb

## 2020-11-29 DIAGNOSIS — H538 Other visual disturbances: Secondary | ICD-10-CM
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
## 2020-12-30 ENCOUNTER — Other Ambulatory Visit: Payer: Self-pay | Admitting: Physician Assistant

## 2021-01-28 ENCOUNTER — Telehealth: Payer: Self-pay

## 2021-01-28 NOTE — Telephone Encounter (Signed)
Patient daughter Joelene Millin called said that [facility]  keeps receiving a referral fax through fax machine from [place]. They cannot accept must have a phone call over the phone to put in the referral. Patient daughter cb# [phone]

## 2021-01-28 NOTE — Telephone Encounter (Signed)
Please review

## 2021-01-30 ENCOUNTER — Encounter: Payer: Self-pay | Admitting: Family Medicine

## 2021-01-30 NOTE — Telephone Encounter (Signed)
Hey you have reached the wrong Lynn Ito, I am a student. thanks

## 2021-02-18 ENCOUNTER — Ambulatory Visit: Payer: PPO | Admitting: Family Medicine

## 2021-02-19 ENCOUNTER — Other Ambulatory Visit: Payer: Self-pay | Admitting: Family Medicine

## 2021-02-26 ENCOUNTER — Other Ambulatory Visit: Payer: Self-pay | Admitting: Family Medicine

## 2021-03-05 DIAGNOSIS — H2513 Age-related nuclear cataract, bilateral: Secondary | ICD-10-CM | POA: Diagnosis not present

## 2021-03-05 LAB — HM DIABETES EYE EXAM

## 2021-03-24 ENCOUNTER — Encounter: Payer: Self-pay | Admitting: Ophthalmology

## 2021-03-24 DIAGNOSIS — H2512 Age-related nuclear cataract, left eye: Secondary | ICD-10-CM | POA: Diagnosis not present

## 2021-03-28 ENCOUNTER — Telehealth: Payer: Self-pay | Admitting: Family Medicine

## 2021-03-28 NOTE — Telephone Encounter (Signed)
Left message for patient to call back and schedule Medicare Annual Wellness Visit (AWV) in office.   If not able to come in office, please offer to do virtually or by telephone.   No history of  AWV: Per [employer] eligible for AWVI as of  10/12/2009  Please schedule at anytime with [facility]-Nurse Health Advisor.  If any questions, please contact me at [phone]  

## 2021-04-01 ENCOUNTER — Other Ambulatory Visit: Payer: Self-pay

## 2021-04-01 MED ORDER — ATENOLOL 50 MG PO TABS
50.0000 mg | ORAL_TABLET | Freq: Every day | ORAL | 3 refills | Status: DC
Start: 2021-04-01 — End: 2021-06-30

## 2021-04-01 NOTE — Anesthesia Preprocedure Evaluation (Addendum)
Anesthesia Evaluation  Patient identified by MRN, date of birth, ID band Patient awake    Reviewed: Allergy & Precautions, NPO status , Patient's Chart, lab work & pertinent test results  History of Anesthesia Complications Negative for: history of anesthetic complications  Airway Mallampati: IV   Neck ROM: Full    Dental  (+) Edentulous Upper, Edentulous Lower   Pulmonary former smoker (quit 1993),    Pulmonary exam normal breath sounds clear to auscultation       Cardiovascular hypertension, + Peripheral Vascular Disease (s/p CEA)  Normal cardiovascular exam Rhythm:Regular Rate:Normal     Neuro/Psych Dementia, HOH    GI/Hepatic negative GI ROS,   Endo/Other  diabetes, Type 2  Renal/GU Renal disease (stage IV CKD)     Musculoskeletal   Abdominal   Peds  Hematology negative hematology ROS (+)   Anesthesia Other Findings   Reproductive/Obstetrics                            Anesthesia Physical Anesthesia Plan  ASA: 3  Anesthesia Plan: MAC   Post-op Pain Management:    Induction: Intravenous  PONV Risk Score and Plan: 1 and Midazolam, TIVA and Treatment may vary due to age or medical condition  Airway Management Planned: Nasal Cannula  Additional Equipment:   Intra-op Plan:   Post-operative Plan:   Informed Consent: I have reviewed the patients History and Physical, chart, labs and discussed the procedure including the risks, benefits and alternatives for the proposed anesthesia with the patient or authorized representative who has indicated his/her understanding and acceptance.       Plan Discussed with: CRNA  Anesthesia Plan Comments:        Anesthesia Quick Evaluation

## 2021-04-08 ENCOUNTER — Encounter
Admission: RE | Disposition: A | Discharge status: Home / Self Care | Payer: Self-pay | Source: Home / Self Care | Attending: Ophthalmology

## 2021-04-08 ENCOUNTER — Ambulatory Visit: Payer: PPO | Admitting: Anesthesiology

## 2021-04-08 ENCOUNTER — Other Ambulatory Visit: Payer: Self-pay

## 2021-04-08 ENCOUNTER — Ambulatory Visit
Admission: RE | Admit: 2021-04-08 | Discharge: 2021-04-08 | Disposition: A | Discharge status: Home / Self Care | Payer: PPO | Attending: Ophthalmology | Admitting: Ophthalmology

## 2021-04-08 DIAGNOSIS — H25812 Combined forms of age-related cataract, left eye: Secondary | ICD-10-CM | POA: Diagnosis not present

## 2021-04-08 DIAGNOSIS — E1136 Type 2 diabetes mellitus with diabetic cataract: Secondary | ICD-10-CM | POA: Insufficient documentation

## 2021-04-08 DIAGNOSIS — Z87891 Personal history of nicotine dependence: Secondary | ICD-10-CM | POA: Insufficient documentation

## 2021-04-08 DIAGNOSIS — Z7984 Long term (current) use of oral hypoglycemic drugs: Secondary | ICD-10-CM | POA: Diagnosis not present

## 2021-04-08 DIAGNOSIS — Z79899 Other long term (current) drug therapy: Secondary | ICD-10-CM | POA: Diagnosis not present

## 2021-04-08 DIAGNOSIS — Z7982 Long term (current) use of aspirin: Secondary | ICD-10-CM | POA: Diagnosis not present

## 2021-04-08 DIAGNOSIS — H2512 Age-related nuclear cataract, left eye: Secondary | ICD-10-CM | POA: Diagnosis not present

## 2021-04-08 DIAGNOSIS — Z7951 Long term (current) use of inhaled steroids: Secondary | ICD-10-CM | POA: Diagnosis not present

## 2021-04-08 DIAGNOSIS — Z806 Family history of leukemia: Secondary | ICD-10-CM | POA: Diagnosis not present

## 2021-04-08 DIAGNOSIS — E785 Hyperlipidemia, unspecified: Secondary | ICD-10-CM | POA: Insufficient documentation

## 2021-04-08 DIAGNOSIS — I10 Essential (primary) hypertension: Secondary | ICD-10-CM | POA: Insufficient documentation

## 2021-04-08 DIAGNOSIS — Z8616 Personal history of COVID-19: Secondary | ICD-10-CM | POA: Diagnosis not present

## 2021-04-08 DIAGNOSIS — Z825 Family history of asthma and other chronic lower respiratory diseases: Secondary | ICD-10-CM | POA: Insufficient documentation

## 2021-04-08 HISTORY — PX: CATARACT EXTRACTION W/PHACO: SHX586

## 2021-04-08 LAB — GLUCOSE, CAPILLARY
Glucose-Capillary: 135 mg/dL — ABNORMAL HIGH (ref 70–99)
Glucose-Capillary: 135 mg/dL — ABNORMAL HIGH (ref 70–99)

## 2021-04-08 SURGERY — PHACOEMULSIFICATION, CATARACT, WITH IOL INSERTION
Anesthesia: Monitor Anesthesia Care | Site: Eye | Laterality: Left

## 2021-04-08 MED ORDER — LIDOCAINE HCL (PF) 2 % IJ SOLN
INTRAOCULAR | Status: DC | PRN
  Administered 2021-04-08: 1 mL

## 2021-04-08 MED ORDER — LABETALOL HCL 5 MG/ML IV SOLN
INTRAVENOUS | Status: DC | PRN
  Administered 2021-04-08: 5 mg via INTRAVENOUS

## 2021-04-08 MED ORDER — FENTANYL CITRATE (PF) 100 MCG/2ML IJ SOLN
INTRAMUSCULAR | Status: DC | PRN
  Administered 2021-04-08: 50 ug via INTRAVENOUS

## 2021-04-08 MED ORDER — BRIMONIDINE TARTRATE-TIMOLOL 0.2-0.5 % OP SOLN
OPHTHALMIC | Status: DC | PRN
  Administered 2021-04-08: 1 [drp] via OPHTHALMIC

## 2021-04-08 MED ORDER — SIGHTPATH DOSE#1 BSS IO SOLN
INTRAOCULAR | Status: DC | PRN
  Administered 2021-04-08: 91 mL via OPHTHALMIC

## 2021-04-08 MED ORDER — MOXIFLOXACIN HCL 0.5 % OP SOLN
OPHTHALMIC | Status: DC | PRN
  Administered 2021-04-08: 0.2 mL via OPHTHALMIC

## 2021-04-08 MED ORDER — PHENYLEPHRINE HCL 10 % OP SOLN
1.0000 [drp] | OPHTHALMIC | Status: AC
  Administered 2021-04-08 (×3): 1 [drp] via OPHTHALMIC

## 2021-04-08 MED ORDER — CYCLOPENTOLATE HCL 2 % OP SOLN
1.0000 [drp] | OPHTHALMIC | Status: AC
  Administered 2021-04-08 (×3): 1 [drp] via OPHTHALMIC

## 2021-04-08 MED ORDER — TETRACAINE HCL 0.5 % OP SOLN
1.0000 [drp] | OPHTHALMIC | Status: DC | PRN
  Administered 2021-04-08 (×4): 1 [drp] via OPHTHALMIC

## 2021-04-08 MED ORDER — SIGHTPATH DOSE#1 NA CHONDROIT SULF-NA HYALURON 40-17 MG/ML IO SOLN
INTRAOCULAR | Status: DC | PRN
  Administered 2021-04-08: 1 mL via INTRAOCULAR

## 2021-04-08 MED ORDER — LACTATED RINGERS IV SOLN: INTRAVENOUS | Status: DC

## 2021-04-08 SURGICAL SUPPLY — 14 items
CANNULA ANT/CHMB 27GA (MISCELLANEOUS) ×6 IMPLANT
GLOVE SURG ENC TEXT LTX SZ8 (GLOVE) ×3 IMPLANT
GLOVE SURG TRIUMPH 8.0 PF LTX (GLOVE) ×3 IMPLANT
GOWN STRL REUS W/ TWL LRG LVL3 (GOWN DISPOSABLE) ×2 IMPLANT
GOWN STRL REUS W/TWL LRG LVL3 (GOWN DISPOSABLE) ×6
LENS IOL TECNIS EYHANCE 23.0 (Intraocular Lens) ×3 IMPLANT
MARKER SKIN DUAL TIP RULER LAB (MISCELLANEOUS) ×3 IMPLANT
NEEDLE FILTER BLUNT 18X 1/2SAF (NEEDLE) ×2
NEEDLE FILTER BLUNT 18X1 1/2 (NEEDLE) ×1 IMPLANT
PACK EYE AFTER SURG (MISCELLANEOUS) ×3 IMPLANT
SYR 3ML LL SCALE MARK (SYRINGE) ×3 IMPLANT
SYR TB 1ML LUER SLIP (SYRINGE) ×3 IMPLANT
WATER STERILE IRR 250ML POUR (IV SOLUTION) ×3 IMPLANT
WIPE NON LINTING 3.25X3.25 (MISCELLANEOUS) ×3 IMPLANT

## 2021-04-08 NOTE — Anesthesia Procedure Notes (Signed)
Procedure Name: MAC Date/Time: 04/08/2021 8:19 AM Performed by: Cameron Ali, CRNA Pre-anesthesia Checklist: Patient identified, Emergency Drugs available, Suction available, Timeout performed and Patient being monitored Patient Re-evaluated:Patient Re-evaluated prior to induction Oxygen Delivery Method: Nasal cannula Placement Confirmation: positive ETCO2

## 2021-04-08 NOTE — Anesthesia Postprocedure Evaluation (Signed)
Anesthesia Post Note  Patient: [name]  Procedure(s) Performed: CATARACT EXTRACTION PHACO AND INTRAOCULAR LENS PLACEMENT (IOC) LEFT DIABETIC 15.64 01:28.9 (Left: Eye)     Patient location during evaluation: PACU Anesthesia Type: MAC Level of consciousness: awake and alert, oriented and patient cooperative Pain management: pain level controlled Vital Signs Assessment: post-procedure vital signs reviewed and stable Respiratory status: spontaneous breathing, nonlabored ventilation and respiratory function stable Cardiovascular status: blood pressure returned to baseline and stable Postop Assessment: adequate PO intake Anesthetic complications: no   No notable events documented.  Darrin Nipper

## 2021-04-08 NOTE — Transfer of Care (Signed)
Immediate Anesthesia Transfer of Care Note  Patient: [name]  Procedure(s) Performed: CATARACT EXTRACTION PHACO AND INTRAOCULAR LENS PLACEMENT (IOC) LEFT DIABETIC 15.64 01:28.9 (Left: Eye)  Patient Location: PACU  Anesthesia Type: MAC  Level of Consciousness: awake, alert  and patient cooperative  Airway and Oxygen Therapy: Patient Spontanous Breathing and Patient connected to supplemental oxygen  Post-op Assessment: Post-op Vital signs reviewed, Patient's Cardiovascular Status Stable, Respiratory Function Stable, Patent Airway and No signs of Nausea or vomiting  Post-op Vital Signs: Reviewed and stable  Complications: No notable events documented.

## 2021-04-08 NOTE — Op Note (Signed)
PREOPERATIVE DIAGNOSIS:  Nuclear sclerotic cataract of the left eye.   POSTOPERATIVE DIAGNOSIS:  Nuclear sclerotic cataract of the left eye.   OPERATIVE PROCEDURE:ORPROCALL@   SURGEON:  Birder Robson, MD.   ANESTHESIA:  Anesthesiologist: Darrin Nipper, MD CRNA: Cameron Ali, CRNA  1.      Managed anesthesia care. 2.     0.66m of Shugarcaine was instilled following the paracentesis   COMPLICATIONS:  None.   TECHNIQUE:   Stop and chop   DESCRIPTION OF PROCEDURE:  The patient was examined and consented in the preoperative holding area where the aforementioned topical anesthesia was applied to the left eye and then brought back to the Operating Room where the left eye was prepped and draped in the usual sterile ophthalmic fashion and a lid speculum was placed. A paracentesis was created with the side port blade and the anterior chamber was filled with viscoelastic. A near clear corneal incision was performed with the steel keratome. A continuous curvilinear capsulorrhexis was performed with a cystotome followed by the capsulorrhexis forceps. Hydrodissection and hydrodelineation were carried out with BSS on a blunt cannula. The lens was removed in a stop and chop  technique and the remaining cortical material was removed with the irrigation-aspiration handpiece. The capsular bag was inflated with viscoelastic and the Technis [id number] lens was placed in the capsular bag without complication. The remaining viscoelastic was removed from the eye with the irrigation-aspiration handpiece. The wounds were hydrated. The anterior chamber was flushed with BSS and the eye was inflated to physiologic pressure. 0.18mVigamox was placed in the anterior chamber. The wounds were found to be water tight. The eye was dressed with Combigan. The patient was given protective glasses to wear throughout the day and a shield with which to sleep tonight. The patient was also given drops with which to begin a drop regimen  today and will follow-up with me in one day. Implant Name Type Inv. Item Serial No. Manufacturer Lot No. LRB No. Used Action  LENS IOL TECNIS EYHANCE 23.0 - [id number] Lens LENS IOL TECNIS EYHANCE 23.0 [id number]   Left 1 Implanted    Procedure(s): CATARACT EXTRACTION PHACO AND INTRAOCULAR LENS PLACEMENT (IOC) LEFT DIABETIC 15.64 01:28.9 (Left)  Electronically signed: WiBirder Robson/28/2022 8:36 AM

## 2021-04-08 NOTE — H&P (Signed)
[name]   Primary Care Physician:  Susy Frizzle, MD Ophthalmologist: Dr. George Ina  Pre-Procedure History & Physical: HPI:  [name] is a 87 y.o. male here for cataract surgery.   Past Medical History:  Diagnosis Date   Arthritis    knee   Carotid artery occlusion    Diabetes mellitus    metformin,januvia,and glipizide daily   Enlarged prostate    pt states no trouble with it   History of COVID-19 08/2020   Hyperlipidemia    takes Simvastatin daily   Hypertension    takes Atenolol and Lisinopril daily   Impaired hearing, left    but doesn't wear hearing aids   Seasonal allergies     Past Surgical History:  Procedure Laterality Date   APPENDECTOMY     as a child   CAROTID ANGIOGRAM N/A 05/09/2013   Procedure: CAROTID ANGIOGRAM;  Surgeon: Serafina Mitchell, MD;  Location: [facility] CATH LAB;  Service: Cardiovascular;  Laterality: N/A;   CAROTID ENDARTERECTOMY Right 2008    Re-do Mar 04, 2012   carotidendartectomy  2008   right side   ENDARTERECTOMY  03/04/2012   Procedure: ENDARTERECTOMY CAROTID;  Surgeon: Rosetta Posner, MD;  Location: [name] OR;  Service: Vascular;  Laterality: Right;  Redo Right Carotid Endarterectomy with hemasheild patch angioplasty   ENDARTERECTOMY Right 06/26/2020   Procedure: REDO CAROTID ENDARTERECTOMY;  Surgeon: Rosetta Posner, MD;  Location: [name] OR;  Service: Vascular;  Laterality: Right;   WOUND EXPLORATION Right 06/26/2020   Procedure: EXPLORATION OF RIGHT NECK INCISION;  Surgeon: Rosetta Posner, MD;  Location: [facility] OR;  Service: Vascular;  Laterality: Right;    Prior to Admission medications   Medication Sig Start Date End Date Taking? Authorizing Provider  aspirin 81 MG EC tablet Take 1 tablet (81 mg total) by mouth daily. Swallow whole. 09/08/20  Yes Thurnell Lose, MD  atenolol (TENORMIN) 50 MG tablet Take 1 tablet (50 mg total) by mouth daily. 04/01/21  Yes Susy Frizzle, MD  bisacodyl (DULCOLAX) 10 MG suppository Place 1  suppository (10 mg total) rectally as needed for moderate constipation. 10/09/20  Yes Noemi Chapel A, NP  docusate sodium (COLACE) 100 MG capsule Take 1 capsule (100 mg total) by mouth 2 (two) times daily. 10/09/20  Yes Noemi Chapel A, NP  furosemide (LASIX) 20 MG tablet TAKE 1 TABLET BY MOUTH EVERY DAY 09/18/20  Yes Susy Frizzle, MD  glipiZIDE (GLUCOTROL XL) 10 MG 24 hr tablet Take 1 tablet (10 mg total) by mouth daily with breakfast. 06/04/20  Yes Susy Frizzle, MD  LINZESS 145 MCG CAPS capsule TAKE 1 CAPSULE BY MOUTH DAILY BEFORE BREAKFAST. 12/31/20  Yes Levin Erp, PA  pioglitazone (ACTOS) 30 MG tablet TAKE 1 TABLET BY MOUTH EVERY DAY 02/26/21  Yes Susy Frizzle, MD  TRULICITY A999333 0000000 SOPN INJECT 0.75 MG INTO THE SKIN ONCE A WEEK. 02/19/21  Yes Susy Frizzle, MD  albuterol (VENTOLIN HFA) 108 (90 Base) MCG/ACT inhaler Inhale 2 puffs into the lungs every 6 (six) hours as needed for wheezing or shortness of breath. Patient not taking: Reported on 03/24/2021 09/08/20   Thurnell Lose, MD  Glucose Blood (BLOOD GLUCOSE TEST STRIPS) STRP Please dispense as One touch Ultra Blue. Use as directed to monitor FSBS 3x daily. Dx: E11.9. 02/07/19   Susy Frizzle, MD    Allergies as of 03/12/2021 - Review Complete 11/29/2020  Allergen Reaction Noted   Tape  Other (See Comments) 09/05/2020    Family History  Problem Relation Age of Onset   Leukemia Mother    Asthma Father    Anesthesia problems Neg Hx    Hypotension Neg Hx    Malignant hyperthermia Neg Hx    Pseudochol deficiency Neg Hx    Colon cancer Neg Hx    Stomach cancer Neg Hx    Esophageal cancer Neg Hx    Pancreatic cancer Neg Hx     Social History   Socioeconomic History   Marital status: Married    Spouse name: Not on file   Number of children: Not on file   Years of education: Not on file   Highest education level: Not on file  Occupational History   Not on file  Tobacco Use    Smoking status: Former    Years: 25.00    Pack years: 0.00    Types: Cigarettes    Quit date: 10/13/1991    Years since quitting: 29.5   Smokeless tobacco: Never  Vaping Use   Vaping Use: Never used  Substance and Sexual Activity   Alcohol use: No    Comment: occassional glass of wine   Drug use: No   Sexual activity: Yes  Other Topics Concern   Not on file  Social History Narrative   Not on file   Social Determinants of Health   Financial Resource Strain: Not on file  Food Insecurity: Not on file  Transportation Needs: Not on file  Physical Activity: Not on file  Stress: Not on file  Social Connections: Not on file  Intimate Partner Violence: Not on file    Review of Systems: See HPI, otherwise negative ROS  Physical Exam: BP (!) 187/96   Pulse 83   Temp 97.6 F (36.4 C) (Temporal)   Ht '5\' 4"'$  (1.626 m)   Wt 81.2 kg   SpO2 97%   BMI 30.73 kg/m  General:   Alert,  pleasant and cooperative in NAD Head:  Normocephalic and atraumatic. Respiratory:  Normal work of breathing. Cardiovascular:  RRR  Impression/Plan: [name] is here for cataract surgery.  Risks, benefits, limitations, and alternatives regarding cataract surgery have been reviewed with the patient.  Questions have been answered.  All parties agreeable.   Birder Robson, MD  04/08/2021, 8:00 AM

## 2021-04-09 ENCOUNTER — Other Ambulatory Visit: Payer: Self-pay

## 2021-04-14 ENCOUNTER — Other Ambulatory Visit: Payer: Self-pay | Admitting: Family Medicine

## 2021-04-16 ENCOUNTER — Encounter: Payer: Self-pay | Admitting: Ophthalmology

## 2021-04-16 DIAGNOSIS — H2511 Age-related nuclear cataract, right eye: Secondary | ICD-10-CM | POA: Diagnosis not present

## 2021-04-21 NOTE — Anesthesia Preprocedure Evaluation (Addendum)
Anesthesia Evaluation  Patient identified by MRN, date of birth, ID band Patient awake    Reviewed: Allergy & Precautions, NPO status , Patient's Chart, lab work & pertinent test results  History of Anesthesia Complications Negative for: history of anesthetic complications  Airway Mallampati: IV   Neck ROM: Full    Dental  (+) Edentulous Upper, Edentulous Lower   Pulmonary former smoker,    Pulmonary exam normal breath sounds clear to auscultation       Cardiovascular hypertension, + Peripheral Vascular Disease (s/p CEA)  Normal cardiovascular exam Rhythm:Regular Rate:Normal     Neuro/Psych Dementia, HOH    GI/Hepatic negative GI ROS,   Endo/Other  diabetes, Type 2  Renal/GU Renal disease (stage IV CKD)     Musculoskeletal   Abdominal   Peds  Hematology negative hematology ROS (+)   Anesthesia Other Findings   Reproductive/Obstetrics                             Anesthesia Physical  Anesthesia Plan  ASA: 3  Anesthesia Plan: MAC   Post-op Pain Management:    Induction: Intravenous  PONV Risk Score and Plan: 1 and Midazolam, TIVA and Treatment may vary due to age or medical condition  Airway Management Planned: Nasal Cannula  Additional Equipment:   Intra-op Plan:   Post-operative Plan:   Informed Consent: I have reviewed the patients History and Physical, chart, labs and discussed the procedure including the risks, benefits and alternatives for the proposed anesthesia with the patient or authorized representative who has indicated his/her understanding and acceptance.       Plan Discussed with: CRNA  Anesthesia Plan Comments:        Anesthesia Quick Evaluation

## 2021-04-22 ENCOUNTER — Ambulatory Visit: Payer: PPO | Admitting: Anesthesiology

## 2021-04-22 ENCOUNTER — Ambulatory Visit
Admission: RE | Admit: 2021-04-22 | Discharge: 2021-04-22 | Disposition: A | Discharge status: Home / Self Care | Payer: PPO | Attending: Ophthalmology | Admitting: Ophthalmology

## 2021-04-22 ENCOUNTER — Encounter: Payer: Self-pay | Admitting: Ophthalmology

## 2021-04-22 ENCOUNTER — Encounter
Admission: RE | Disposition: A | Discharge status: Home / Self Care | Payer: Self-pay | Source: Home / Self Care | Attending: Ophthalmology

## 2021-04-22 DIAGNOSIS — Z87891 Personal history of nicotine dependence: Secondary | ICD-10-CM | POA: Diagnosis not present

## 2021-04-22 DIAGNOSIS — Z7984 Long term (current) use of oral hypoglycemic drugs: Secondary | ICD-10-CM | POA: Insufficient documentation

## 2021-04-22 DIAGNOSIS — Z91048 Other nonmedicinal substance allergy status: Secondary | ICD-10-CM | POA: Insufficient documentation

## 2021-04-22 DIAGNOSIS — Z825 Family history of asthma and other chronic lower respiratory diseases: Secondary | ICD-10-CM | POA: Insufficient documentation

## 2021-04-22 DIAGNOSIS — I10 Essential (primary) hypertension: Secondary | ICD-10-CM | POA: Diagnosis not present

## 2021-04-22 DIAGNOSIS — H25811 Combined forms of age-related cataract, right eye: Secondary | ICD-10-CM | POA: Diagnosis not present

## 2021-04-22 DIAGNOSIS — E1136 Type 2 diabetes mellitus with diabetic cataract: Secondary | ICD-10-CM | POA: Insufficient documentation

## 2021-04-22 DIAGNOSIS — Z8616 Personal history of COVID-19: Secondary | ICD-10-CM | POA: Diagnosis not present

## 2021-04-22 DIAGNOSIS — Z79899 Other long term (current) drug therapy: Secondary | ICD-10-CM | POA: Insufficient documentation

## 2021-04-22 DIAGNOSIS — H2511 Age-related nuclear cataract, right eye: Secondary | ICD-10-CM | POA: Insufficient documentation

## 2021-04-22 DIAGNOSIS — Z7982 Long term (current) use of aspirin: Secondary | ICD-10-CM | POA: Diagnosis not present

## 2021-04-22 DIAGNOSIS — Z806 Family history of leukemia: Secondary | ICD-10-CM | POA: Diagnosis not present

## 2021-04-22 HISTORY — PX: CATARACT EXTRACTION W/PHACO: SHX586

## 2021-04-22 LAB — GLUCOSE, CAPILLARY
Glucose-Capillary: 240 mg/dL — ABNORMAL HIGH (ref 70–99)
Glucose-Capillary: 203 mg/dL — ABNORMAL HIGH (ref 70–99)

## 2021-04-22 SURGERY — PHACOEMULSIFICATION, CATARACT, WITH IOL INSERTION
Anesthesia: Monitor Anesthesia Care | Site: Eye | Laterality: Right

## 2021-04-22 MED ORDER — INSULIN LISPRO 100 UNIT/ML IJ SOLN
2.0000 [IU] | Freq: Once | INTRAMUSCULAR | Status: AC
  Administered 2021-04-22: 2 [IU] via SUBCUTANEOUS

## 2021-04-22 MED ORDER — MOXIFLOXACIN HCL 0.5 % OP SOLN
OPHTHALMIC | Status: DC | PRN
  Administered 2021-04-22: 0.2 mL via OPHTHALMIC

## 2021-04-22 MED ORDER — TETRACAINE HCL 0.5 % OP SOLN
1.0000 [drp] | OPHTHALMIC | Status: DC | PRN
  Administered 2021-04-22 (×3): 1 [drp] via OPHTHALMIC

## 2021-04-22 MED ORDER — FENTANYL CITRATE (PF) 100 MCG/2ML IJ SOLN
INTRAMUSCULAR | Status: DC | PRN
  Administered 2021-04-22: 50 ug via INTRAVENOUS

## 2021-04-22 MED ORDER — SIGHTPATH DOSE#1 BSS IO SOLN
INTRAOCULAR | Status: DC | PRN
  Administered 2021-04-22: 74 mL via OPHTHALMIC

## 2021-04-22 MED ORDER — LIDOCAINE HCL (PF) 2 % IJ SOLN
INTRAOCULAR | Status: DC | PRN
  Administered 2021-04-22: 2 mL

## 2021-04-22 MED ORDER — SIGHTPATH DOSE#1 NA CHONDROIT SULF-NA HYALURON 40-17 MG/ML IO SOLN
INTRAOCULAR | Status: DC | PRN
  Administered 2021-04-22: 1 mL via INTRAOCULAR

## 2021-04-22 MED ORDER — ONDANSETRON HCL 4 MG/2ML IJ SOLN: 4.0000 mg | Freq: Once | INTRAMUSCULAR | Status: DC | PRN

## 2021-04-22 MED ORDER — CYCLOPENTOLATE HCL 2 % OP SOLN
1.0000 [drp] | OPHTHALMIC | Status: AC
  Administered 2021-04-22 (×3): 1 [drp] via OPHTHALMIC

## 2021-04-22 MED ORDER — ACETAMINOPHEN 160 MG/5ML PO SOLN: 325.0000 mg | ORAL | Status: DC | PRN

## 2021-04-22 MED ORDER — PHENYLEPHRINE HCL 10 % OP SOLN
1.0000 [drp] | OPHTHALMIC | Status: AC
  Administered 2021-04-22 (×3): 1 [drp] via OPHTHALMIC

## 2021-04-22 MED ORDER — LACTATED RINGERS IV SOLN: INTRAVENOUS | Status: DC

## 2021-04-22 MED ORDER — ACETAMINOPHEN 325 MG PO TABS: 650.0000 mg | ORAL_TABLET | Freq: Once | ORAL | Status: DC | PRN

## 2021-04-22 MED ORDER — BRIMONIDINE TARTRATE-TIMOLOL 0.2-0.5 % OP SOLN
OPHTHALMIC | Status: DC | PRN
  Administered 2021-04-22: 1 [drp] via OPHTHALMIC

## 2021-04-22 SURGICAL SUPPLY — 14 items
CANNULA ANT/CHMB 27GA (MISCELLANEOUS) ×4 IMPLANT
GLOVE SURG ENC TEXT LTX SZ8 (GLOVE) ×2 IMPLANT
GLOVE SURG TRIUMPH 8.0 PF LTX (GLOVE) ×2 IMPLANT
GOWN STRL REUS W/ TWL LRG LVL3 (GOWN DISPOSABLE) ×2 IMPLANT
GOWN STRL REUS W/TWL LRG LVL3 (GOWN DISPOSABLE) ×4
LENS IOL TECNIS EYHANCE 23.5 (Intraocular Lens) ×2 IMPLANT
MARKER SKIN DUAL TIP RULER LAB (MISCELLANEOUS) ×2 IMPLANT
NEEDLE FILTER BLUNT 18X 1/2SAF (NEEDLE) ×1
NEEDLE FILTER BLUNT 18X1 1/2 (NEEDLE) ×1 IMPLANT
PACK EYE AFTER SURG (MISCELLANEOUS) ×2 IMPLANT
SYR 3ML LL SCALE MARK (SYRINGE) ×2 IMPLANT
SYR TB 1ML LUER SLIP (SYRINGE) ×2 IMPLANT
WATER STERILE IRR 250ML POUR (IV SOLUTION) ×2 IMPLANT
WIPE NON LINTING 3.25X3.25 (MISCELLANEOUS) ×2 IMPLANT

## 2021-04-22 NOTE — Transfer of Care (Signed)
Immediate Anesthesia Transfer of Care Note  Patient: [name]  Procedure(s) Performed: CATARACT EXTRACTION PHACO AND INTRAOCULAR LENS PLACEMENT (IOC) RIGHT DIABETIC 12.52 01:16.4 (Right: Eye)  Patient Location: PACU  Anesthesia Type: MAC  Level of Consciousness: awake, alert  and patient cooperative  Airway and Oxygen Therapy: Patient Spontanous Breathing and Patient connected to supplemental oxygen  Post-op Assessment: Post-op Vital signs reviewed, Patient's Cardiovascular Status Stable, Respiratory Function Stable, Patent Airway and No signs of Nausea or vomiting  Post-op Vital Signs: Reviewed and stable  Complications: No notable events documented.

## 2021-04-22 NOTE — Op Note (Signed)
PREOPERATIVE DIAGNOSIS:  Nuclear sclerotic cataract of the right eye.   POSTOPERATIVE DIAGNOSIS:  Cataract   OPERATIVE PROCEDURE:ORPROCALL@   SURGEON:  Maurice Robson, MD.   ANESTHESIA:  Anesthesiologist: Darrin Nipper, MD CRNA: Silvana Newness, CRNA  1.      Managed anesthesia care. 2.      0.18m of Shugarcaine was instilled in the eye following the paracentesis.   COMPLICATIONS:  None.   TECHNIQUE:   Stop and chop   DESCRIPTION OF PROCEDURE:  The patient was examined and consented in the preoperative holding area where the aforementioned topical anesthesia was applied to the right eye and then brought back to the Operating Room where the right eye was prepped and draped in the usual sterile ophthalmic fashion and a lid speculum was placed. A paracentesis was created with the side port blade and the anterior chamber was filled with viscoelastic. A near clear corneal incision was performed with the steel keratome. A continuous curvilinear capsulorrhexis was performed with a cystotome followed by the capsulorrhexis forceps. Hydrodissection and hydrodelineation were carried out with BSS on a blunt cannula. The lens was removed in a stop and chop  technique and the remaining cortical material was removed with the irrigation-aspiration handpiece. The capsular bag was inflated with viscoelastic and the Technis [id number]  lens was placed in the capsular bag without complication. The remaining viscoelastic was removed from the eye with the irrigation-aspiration handpiece. The wounds were hydrated. The anterior chamber was flushed with BSS and the eye was inflated to physiologic pressure. 0.168mof Vigamox was placed in the anterior chamber. The wounds were found to be water tight. The eye was dressed with Combigan. The patient was given protective glasses to wear throughout the day and a shield with which to sleep tonight. The patient was also given drops with which to begin a drop regimen today and will  follow-up with me in one day. Implant Name Type Inv. Item Serial No. Manufacturer Lot No. LRB No. Used Action  LENS IOL TECNIS EYHANCE 23.5 - [id number] Lens LENS IOL TECNIS EYHANCE 23.5 [id number]   Right 1 Implanted   Procedure(s): CATARACT EXTRACTION PHACO AND INTRAOCULAR LENS PLACEMENT (IOC) RIGHT DIABETIC 12.52 01:16.4 (Right)  Electronically signed: WiBirder [name]/09/2021 7:47 AM

## 2021-04-22 NOTE — Anesthesia Postprocedure Evaluation (Signed)
Anesthesia Post Note  Patient: Elin Seats  Procedure(s) Performed: CATARACT EXTRACTION PHACO AND INTRAOCULAR LENS PLACEMENT (IOC) RIGHT DIABETIC 12.52 01:16.4 (Right: Eye)     Patient location during evaluation: PACU Anesthesia Type: MAC Level of consciousness: awake and alert, oriented and patient cooperative Pain management: pain level controlled Vital Signs Assessment: post-procedure vital signs reviewed and stable Respiratory status: spontaneous breathing, nonlabored ventilation and respiratory function stable Cardiovascular status: blood pressure returned to baseline and stable Postop Assessment: adequate PO intake Anesthetic complications: no   No notable events documented.  Darrin Nipper

## 2021-04-22 NOTE — H&P (Signed)
[facility]   Primary Care Physician:  Susy Frizzle, MD Ophthalmologist: Dr. George Ina  Pre-Procedure History & Physical: HPI:  [name] is a 87 y.o. male here for cataract surgery.   Past Medical History:  Diagnosis Date   Arthritis    knee   Carotid artery occlusion    Diabetes mellitus    metformin,januvia,and glipizide daily   Enlarged prostate    pt states no trouble with it   History of COVID-19 08/2020   Hyperlipidemia    takes Simvastatin daily   Hypertension    takes Atenolol and Lisinopril daily   Impaired hearing, left    but doesn't wear hearing aids   Seasonal allergies     Past Surgical History:  Procedure Laterality Date   APPENDECTOMY     as a child   CAROTID ANGIOGRAM N/A 05/09/2013   Procedure: CAROTID ANGIOGRAM;  Surgeon: Serafina Mitchell, MD;  Location: [facility] CATH LAB;  Service: Cardiovascular;  Laterality: N/A;   CAROTID ENDARTERECTOMY Right 2008    Re-do Mar 04, 2012   carotidendartectomy  2008   right side   CATARACT EXTRACTION W/PHACO Left 04/08/2021   Procedure: CATARACT EXTRACTION PHACO AND INTRAOCULAR LENS PLACEMENT (IOC) LEFT DIABETIC 15.64 01:28.9;  Surgeon: Birder Robson, MD;  Location: [place];  Service: Ophthalmology;  Laterality: Left;   ENDARTERECTOMY  03/04/2012   Procedure: ENDARTERECTOMY CAROTID;  Surgeon: Rosetta Posner, MD;  Location: [facility] OR;  Service: Vascular;  Laterality: Right;  Redo Right Carotid Endarterectomy with hemasheild patch angioplasty   ENDARTERECTOMY Right 06/26/2020   Procedure: REDO CAROTID ENDARTERECTOMY;  Surgeon: Rosetta Posner, MD;  Location: [facility] OR;  Service: Vascular;  Laterality: Right;   WOUND EXPLORATION Right 06/26/2020   Procedure: EXPLORATION OF RIGHT NECK INCISION;  Surgeon: Rosetta Posner, MD;  Location: [facility] OR;  Service: Vascular;  Laterality: Right;    Prior to Admission medications   Medication Sig Start Date End Date Taking? Authorizing Provider  aspirin 81 MG EC tablet  Take 1 tablet (81 mg total) by mouth daily. Swallow whole. 09/08/20  Yes Thurnell Lose, MD  atenolol (TENORMIN) 50 MG tablet Take 1 tablet (50 mg total) by mouth daily. 04/01/21  Yes Susy Frizzle, MD  docusate sodium (COLACE) 100 MG capsule Take 1 capsule (100 mg total) by mouth 2 (two) times daily. 10/09/20  Yes Noemi Chapel A, NP  furosemide (LASIX) 20 MG tablet TAKE 1 TABLET BY MOUTH EVERY DAY 09/18/20  Yes Susy Frizzle, MD  glipiZIDE (GLUCOTROL XL) 10 MG 24 hr tablet TAKE 1 TABLET BY MOUTH EVERY DAY WITH BREAKFAST 04/15/21  Yes Susy Frizzle, MD  LINZESS 145 MCG CAPS capsule TAKE 1 CAPSULE BY MOUTH DAILY BEFORE BREAKFAST. 12/31/20  Yes Levin Erp, PA  pioglitazone (ACTOS) 30 MG tablet TAKE 1 TABLET BY MOUTH EVERY DAY 02/26/21  Yes Susy Frizzle, MD  TRULICITY [id number] SOPN INJECT 0.75 MG INTO THE SKIN ONCE A WEEK. 02/19/21  Yes Susy Frizzle, MD  albuterol (VENTOLIN HFA) 108 (90 Base) MCG/ACT inhaler Inhale 2 puffs into the lungs every 6 (six) hours as needed for wheezing or shortness of breath. Patient not taking: Reported on 03/24/2021 09/08/20   Thurnell Lose, MD  bisacodyl (DULCOLAX) 10 MG suppository Place 1 suppository (10 mg total) rectally as needed for moderate constipation. 10/09/20   Eulogio Bear, NP  Glucose Blood (BLOOD GLUCOSE TEST STRIPS) STRP Please dispense as One touch Ultra  Blue. Use as directed to monitor FSBS 3x daily. Dx: E11.9. 02/07/19   Susy Frizzle, MD    Allergies as of 03/12/2021 - Review Complete 11/29/2020  Allergen Reaction Noted   Tape Other (See Comments) 09/05/2020    Family History  Problem Relation Age of Onset   Leukemia Mother    Asthma Father    Anesthesia problems Neg Hx    Hypotension Neg Hx    Malignant hyperthermia Neg Hx    Pseudochol deficiency Neg Hx    Colon cancer Neg Hx    Stomach cancer Neg Hx    Esophageal cancer Neg Hx    Pancreatic cancer Neg Hx     Social History    Socioeconomic History   Marital status: Married    Spouse name: Not on file   Number of children: Not on file   Years of education: Not on file   Highest education level: Not on file  Occupational History   Not on file  Tobacco Use   Smoking status: Former    Years: 25.00    Pack years: 0.00    Types: Cigarettes    Quit date: 10/13/1991    Years since quitting: 29.5   Smokeless tobacco: Never  Vaping Use   Vaping Use: Never used  Substance and Sexual Activity   Alcohol use: No    Comment: occassional glass of wine   Drug use: No   Sexual activity: Yes  Other Topics Concern   Not on file  Social History Narrative   Not on file   Social Determinants of Health   Financial Resource Strain: Not on file  Food Insecurity: Not on file  Transportation Needs: Not on file  Physical Activity: Not on file  Stress: Not on file  Social Connections: Not on file  Intimate Partner Violence: Not on file    Review of Systems: See HPI, otherwise negative ROS  Physical Exam: BP (!) 163/99   Pulse 67   Temp (!) 97.5 F (36.4 C) (Temporal)   Resp 18   Ht '5\' 4"'$  (1.626 m)   Wt 81.6 kg   SpO2 99%   BMI 30.90 kg/m  General:   Alert, cooperative in NAD Head:  Normocephalic and atraumatic. Respiratory:  Normal work of breathing. Cardiovascular:  RRR  Impression/Plan: [name] is here for cataract surgery.  Risks, benefits, limitations, and alternatives regarding cataract surgery have been reviewed with the patient.  Questions have been answered.  All parties agreeable.   Birder Robson, MD  04/22/2021, 7:14 AM

## 2021-04-25 ENCOUNTER — Encounter: Payer: Self-pay | Admitting: Ophthalmology

## 2021-06-24 ENCOUNTER — Encounter: Payer: Self-pay | Admitting: Family Medicine

## 2021-06-30 ENCOUNTER — Other Ambulatory Visit: Payer: Self-pay | Admitting: Family Medicine

## 2021-07-21 ENCOUNTER — Other Ambulatory Visit (HOSPITAL_COMMUNITY): Payer: Self-pay | Admitting: Family Medicine

## 2021-07-21 ENCOUNTER — Other Ambulatory Visit: Payer: Self-pay

## 2021-07-21 ENCOUNTER — Ambulatory Visit (HOSPITAL_COMMUNITY)
Admission: RE | Admit: 2021-07-21 | Discharge: 2021-07-21 | Disposition: A | Discharge status: Home / Self Care | Payer: PPO | Source: Ambulatory Visit | Attending: Family Medicine | Admitting: Family Medicine

## 2021-07-21 DIAGNOSIS — I6523 Occlusion and stenosis of bilateral carotid arteries: Secondary | ICD-10-CM | POA: Diagnosis not present

## 2021-07-23 ENCOUNTER — Other Ambulatory Visit: Payer: Self-pay | Admitting: *Deleted

## 2021-07-23 DIAGNOSIS — I2510 Atherosclerotic heart disease of native coronary artery without angina pectoris: Secondary | ICD-10-CM

## 2021-07-23 DIAGNOSIS — I6522 Occlusion and stenosis of left carotid artery: Secondary | ICD-10-CM

## 2021-08-04 ENCOUNTER — Encounter (INDEPENDENT_AMBULATORY_CARE_PROVIDER_SITE_OTHER): Payer: PPO | Admitting: Nurse Practitioner

## 2021-08-25 NOTE — Progress Notes (Signed)
[facility] [place]  ASSESSMENT / PLAN: [name] is a 87 y.o. male with asymptomatic left 26 - 79 % carotid artery stenosis. History of right common-internal carotid artery bypass with GSV.  The patient should continue best medical therapy for carotid artery stenosis including: Complete cessation from all tobacco products. Blood glucose control with goal A1c < 7%. Blood pressure control with goal blood pressure < 140/90 mmHg. Lipid reduction therapy with goal LDL-C <100 mg/dL (<70 if symptomatic from carotid artery stenosis).  Aspirin 81mg  PO QD.  Atorvastatin 40-80mg  PO QD (or other "high intensity" statin therapy).  No need for intervention for left asymptomatic disease. Will start surveillance. Follow up with me in 1 year.   CHIEF COMPLAINT: carotid stenosis  HISTORY OF PRESENT ILLNESS: [name] is a 87 y.o. male well known to our service who returns to discuss left carotid artery stenosis. He has a strong history of cerebrovascular disease (see below). His primary care physician has been monitoring his carotid stenosis and identified progression of left carotid artery stenosis. He has had multiple interventions on his right carotid artery.   VASCULAR SURGICAL HISTORY:  Right carotid endarterectomy Right re-do carotid endarterectomy 03/04/12 Carotid and cerebral angiogram 05/09/13 Right common carotid to internal carotid artery bypass with greater saphenous vein 06/26/20 Past Medical History:  Diagnosis Date   Arthritis    knee   Carotid artery occlusion    Diabetes mellitus    metformin,januvia,and glipizide daily   Enlarged prostate    pt states no trouble with it   History of COVID-19 08/2020   Hyperlipidemia    takes Simvastatin daily   Hypertension    takes Atenolol and Lisinopril daily   Impaired hearing, left    but doesn't wear hearing aids   Seasonal allergies     Past Surgical History:  Procedure Laterality Date    APPENDECTOMY     as a child   CAROTID ANGIOGRAM N/A 05/09/2013   Procedure: CAROTID ANGIOGRAM;  Surgeon: Serafina Mitchell, MD;  Location: [facility] CATH LAB;  Service: Cardiovascular;  Laterality: N/A;   CAROTID ENDARTERECTOMY Right 2008    Re-do Mar 04, 2012   carotidendartectomy  2008   right side   CATARACT EXTRACTION W/PHACO Left 04/08/2021   Procedure: CATARACT EXTRACTION PHACO AND INTRAOCULAR LENS PLACEMENT (IOC) LEFT DIABETIC 15.64 01:28.9;  Surgeon: Birder Robson, MD;  Location: [employer];  Service: Ophthalmology;  Laterality: Left;   CATARACT EXTRACTION W/PHACO Right 04/22/2021   Procedure: CATARACT EXTRACTION PHACO AND INTRAOCULAR LENS PLACEMENT (IOC) RIGHT DIABETIC 12.52 01:16.4;  Surgeon: Birder Robson, MD;  Location: [place];  Service: Ophthalmology;  Laterality: Right;   ENDARTERECTOMY  03/04/2012   Procedure: ENDARTERECTOMY CAROTID;  Surgeon: Rosetta Posner, MD;  Location: [facility] OR;  Service: Vascular;  Laterality: Right;  Redo Right Carotid Endarterectomy with hemasheild patch angioplasty   ENDARTERECTOMY Right 06/26/2020   Procedure: REDO CAROTID ENDARTERECTOMY;  Surgeon: Rosetta Posner, MD;  Location: [facility] OR;  Service: Vascular;  Laterality: Right;   WOUND EXPLORATION Right 06/26/2020   Procedure: EXPLORATION OF RIGHT NECK INCISION;  Surgeon: Rosetta Posner, MD;  Location: [facility] OR;  Service: Vascular;  Laterality: Right;    Family History  Problem Relation Age of Onset   Leukemia Mother    Asthma Father    Anesthesia problems Neg Hx    Hypotension Neg Hx    Malignant hyperthermia Neg Hx    Pseudochol deficiency Neg Hx  Colon cancer Neg Hx    Stomach cancer Neg Hx    Esophageal cancer Neg Hx    Pancreatic cancer Neg Hx     Social History   Socioeconomic History   Marital status: Married    Spouse name: Not on file   Number of children: Not on file   Years of education: Not on file   Highest education level: Not on file  Occupational History   Not on  file  Tobacco Use   Smoking status: Former    Years: 25.00    Types: Cigarettes    Quit date: 10/13/1991    Years since quitting: 29.8   Smokeless tobacco: Never  Vaping Use   Vaping Use: Never used  Substance and Sexual Activity   Alcohol use: No    Comment: occassional glass of wine   Drug use: No   Sexual activity: Yes  Other Topics Concern   Not on file  Social History Narrative   Not on file   Social Determinants of Health   Financial Resource Strain: Not on file  Food Insecurity: Not on file  Transportation Needs: Not on file  Physical Activity: Not on file  Stress: Not on file  Social Connections: Not on file  Intimate Partner Violence: Not on file    Allergies  Allergen Reactions   Tape Other (See Comments)    PLEASE USE EASY-RELEASE TAPE!!!! THE SKIN IS VERY SENSITIVE!!    Current Outpatient Medications  Medication Sig Dispense Refill   aspirin 81 MG EC tablet Take 1 tablet (81 mg total) by mouth daily. Swallow whole. 30 tablet 0   atenolol (TENORMIN) 50 MG tablet TAKE 1 TABLET BY MOUTH EVERY DAY 90 tablet 1   bisacodyl (DULCOLAX) 10 MG suppository Place 1 suppository (10 mg total) rectally as needed for moderate constipation. 12 suppository 0   furosemide (LASIX) 20 MG tablet TAKE 1 TABLET BY MOUTH EVERY DAY 90 tablet 3   glipiZIDE (GLUCOTROL XL) 10 MG 24 hr tablet TAKE 1 TABLET BY MOUTH EVERY DAY WITH BREAKFAST 90 tablet 3   Glucose Blood (BLOOD GLUCOSE TEST STRIPS) STRP Please dispense as One touch Ultra Blue. Use as directed to monitor FSBS 3x daily. Dx: E11.9. 100 each 4   LINZESS 145 MCG CAPS capsule TAKE 1 CAPSULE BY MOUTH DAILY BEFORE BREAKFAST. 30 capsule 2   pioglitazone (ACTOS) 30 MG tablet TAKE 1 TABLET BY MOUTH EVERY DAY 90 tablet 2   TRULICITY 7.49 SW/9.6PR SOPN INJECT 0.75 MG INTO THE SKIN ONCE A WEEK. 4 mL 11   albuterol (VENTOLIN HFA) 108 (90 Base) MCG/ACT inhaler Inhale 2 puffs into the lungs every 6 (six) hours as needed for wheezing or  shortness of breath. (Patient not taking: No sig reported) 6.7 g 0   No current facility-administered medications for this visit.    REVIEW OF SYSTEMS:  [X]  denotes positive finding, [ ]  denotes negative finding Cardiac  Comments:  Chest pain or chest pressure:    Shortness of breath upon exertion:    Short of breath when lying flat:    Irregular heart rhythm:        Vascular    Pain in calf, thigh, or hip brought on by ambulation:    Pain in feet at night that wakes you up from your sleep:     Blood clot in your veins:    Leg swelling:         Pulmonary    Oxygen at home:  Productive cough:     Wheezing:         Neurologic    Sudden weakness in arms or legs:     Sudden numbness in arms or legs:     Sudden onset of difficulty speaking or slurred speech:    Temporary loss of vision in one eye:     Problems with dizziness:         Gastrointestinal    Blood in stool:     Vomited blood:         Genitourinary    Burning when urinating:     Blood in urine:        Psychiatric    Major depression:         Hematologic    Bleeding problems:    Problems with blood clotting too easily:        Skin    Rashes or ulcers:        Constitutional    Fever or chills:      PHYSICAL EXAM Vitals:   08/26/21 1135  BP: (!) 170/83  Pulse: 68  Resp: 20  Temp: 98.1 F (36.7 C)  SpO2: 93%  Weight: 177 lb (80.3 kg)  Height: 5\' 4"  (1.626 m)    Constitutional: elderly. No distress. Appears well nourished.  Neurologic: CN intact. Hard of hearing. No focal findings. No sensory loss. Psychiatric:  Mood and affect symmetric and appropriate. Eyes:  No icterus. No conjunctival pallor. Ears, nose, throat:  mucous membranes moist. Midline trachea.  Cardiac: regular rate and rhythm.  Respiratory:  unlabored. Abdominal:  soft, non-tender, non-distended.  Extremity: no edema. no cyanosis. no pallor.  Skin: no gangrene. no ulceration.  Lymphatic: no Stemmer's sign. no palpable  lymphadenopathy.  PERTINENT LABORATORY AND RADIOLOGIC DATA  Most recent CBC CBC Latest Ref Rng & Units 09/08/2020 09/07/2020 09/06/2020  WBC 4.0 - 10.5 K/uL 9.0 8.2 2.3(L)  Hemoglobin 13.0 - 17.0 g/dL 12.4(L) 12.7(L) 13.1  Hematocrit 39.0 - 52.0 % 37.0(L) 38.1(L) 40.9  Platelets 150 - 400 K/uL 167 144(L) 102(L)     Most recent CMP CMP Latest Ref Rng & Units 09/08/2020 09/07/2020 09/06/2020  Glucose 70 - 99 mg/dL 130(H) 167(H) 257(H)  BUN 8 - 23 mg/dL 42(H) 50(H) 31(H)  Creatinine 0.61 - 1.24 mg/dL 1.61(H) 2.13(H) 1.65(H)  Sodium 135 - 145 mmol/L 136 133(L) 135  Potassium 3.5 - 5.1 mmol/L 4.0 4.1 3.9  Chloride 98 - 111 mmol/L 104 100 104  CO2 22 - 32 mmol/L 22 21(L) 19(L)  Calcium 8.9 - 10.3 mg/dL 8.5(L) 8.4(L) 8.2(L)  Total Protein 6.5 - 8.1 g/dL 5.5(L) 5.7(L) 5.7(L)  Total Bilirubin 0.3 - 1.2 mg/dL 0.6 0.5 0.3  Alkaline Phos 38 - 126 U/L 49 54 55  AST 15 - 41 U/L 27 26 26   ALT 0 - 44 U/L 22 20 18     Renal function CrCl cannot be calculated (Patient's most recent lab result is older than the maximum 21 days allowed.).  Hgb A1c MFr Bld (% of total Hgb)  Date Value  06/04/2020 10.6 (H)    LDL Cholesterol (Calc)  Date Value Ref Range Status  01/05/2020 104 (H) mg/dL (calc) Final    Comment:    Reference range: <100 . Desirable range <100 mg/dL for primary prevention;   <70 mg/dL for patients with CHD or diabetic patients  with > or = 2 CHD risk factors. [name] Kitchen LDL-C is now calculated using the Martin-Hopkins  calculation, which is a validated novel  method providing  better accuracy than the Friedewald equation in the  estimation of LDL-C.  Cresenciano Genre et al. Annamaria Helling. 1712;787(18): 2061-2068  (http://education.[employer].com/faq/FAQ164)      Vascular Imaging: Right Carotid: Widely patent CCA to ICA bypass without evidence of  stenosis.   Left Carotid: Velocities in the left ICA are consistent with a 60-79%  stenosis.                The ECA appears >50% stenosed.    Vertebrals:  Bilateral vertebral arteries demonstrate antegrade flow.  Subclavians: Normal flow hemodynamics were seen in bilateral subclavian               arteries.   Yevonne Aline. Stanford Breed, MD [facility] Phone Number: [phone] 08/26/2021 5:12 PM  Total time spent on preparing this encounter including chart review, data review, collecting history, examining the patient, coordinating care for this established patient, 40 minutes.  Portions of this report may have been transcribed using voice recognition software.  Every effort has been made to ensure accuracy; however, inadvertent computerized transcription errors may still be present.

## 2021-08-26 ENCOUNTER — Other Ambulatory Visit: Payer: Self-pay

## 2021-08-26 ENCOUNTER — Ambulatory Visit (INDEPENDENT_AMBULATORY_CARE_PROVIDER_SITE_OTHER): Payer: PPO | Admitting: Vascular Surgery

## 2021-08-26 VITALS — BP 170/83 | HR 68 | Temp 98.1°F | Resp 20 | Ht 64.0 in | Wt 177.0 lb

## 2021-08-26 DIAGNOSIS — I6523 Occlusion and stenosis of bilateral carotid arteries: Secondary | ICD-10-CM | POA: Diagnosis not present

## 2021-09-11 ENCOUNTER — Telehealth: Payer: Self-pay | Admitting: Family Medicine

## 2021-09-11 NOTE — Telephone Encounter (Signed)
LVM 09/11/21 @10 :07am to r/s AWV appt on 09/12/21@ 330pm. Please rescheduled khc

## 2021-09-12 ENCOUNTER — Ambulatory Visit: Payer: PPO

## 2021-09-19 ENCOUNTER — Telehealth: Payer: Self-pay | Admitting: *Deleted

## 2021-09-19 NOTE — Chronic Care Management (AMB) (Signed)
  Chronic Care Management   Note  09/19/2021 Name: Kaedan Richert MRN: [id number] DOB: Apr 15, 1931  Kenyatte Chatmon is a 87 y.o. year old male who is a primary care patient of Susy Frizzle, MD. I reached out to Boris Lown by phone today in response to a referral sent by Mr. Idriss Quackenbush Perrell's PCP.  Mr. Detter was given information about Chronic Care Management services today including:  CCM service includes personalized support from designated clinical staff supervised by his physician, including individualized plan of care and coordination with other care providers 24/7 contact phone numbers for assistance for urgent and routine care needs. Service will only be billed when office clinical staff spend 20 minutes or more in a month to coordinate care. Only one practitioner may furnish and bill the service in a calendar month. The patient may stop CCM services at any time (effective at the end of the month) by phone call to the office staff. The patient is responsible for co-pay (up to 20% after annual deductible is met) if co-pay is required by the individual health plan.   Patient agreed to services and verbal consent obtained.   Follow up plan: Telephone appointment with care management team member scheduled for:09/22/21  [facility]  Direct Dial: [phone]

## 2021-09-22 ENCOUNTER — Telehealth: Payer: Self-pay | Admitting: *Deleted

## 2021-09-22 ENCOUNTER — Telehealth: Payer: PPO

## 2021-09-22 NOTE — Telephone Encounter (Signed)
  Care Management   Follow Up Note   09/22/2021 Name: [name] MRN: [id number] DOB: 1931-05-08   Referred by: Susy Frizzle, MD Reason for referral : Chronic Care Management (DM2, ASCVD, HTN)   An unsuccessful telephone outreach was attempted today. The patient was referred to the case management team for assistance with care management and care coordination.   Follow Up Plan: Telephone follow up appointment with care management team member scheduled for:  upon care guide rescheduling.  Jacqlyn Larsen RNC, BSN RN Case Manager [facility] [phone]

## 2021-10-10 ENCOUNTER — Telehealth: Payer: PPO

## 2021-10-10 ENCOUNTER — Telehealth: Payer: Self-pay | Admitting: *Deleted

## 2021-10-10 NOTE — Telephone Encounter (Signed)
°  Care Management   Follow Up Note   10/10/2021 Name: [name] MRN: [id number] DOB: 1931-08-30   Referred by: Susy Frizzle, MD Reason for referral : Chronic Care Management (DM2, ASCVD w/ HTN)   A second unsuccessful telephone outreach was attempted today. The patient was referred to the case management team for assistance with care management and care coordination.   Follow Up Plan: Telephone follow up appointment with care management team member scheduled for: upon care guide rescheduling.  Jacqlyn Larsen RNC, BSN RN Case Manager [facility] [phone]

## 2021-10-16 ENCOUNTER — Telehealth: Payer: Self-pay | Admitting: Family Medicine

## 2021-10-16 NOTE — Telephone Encounter (Signed)
I left a message for patient's daughter, Maudie Mercury, to call back and schedule Medicare Annual Wellness Visit (AWV) in office.   If not able to come in office, please offer to do virtually or by telephone.  Left office number and my jabber [phone].  Due for AWVI  Please schedule at anytime with Nurse Health Advisor.

## 2021-10-31 ENCOUNTER — Ambulatory Visit (INDEPENDENT_AMBULATORY_CARE_PROVIDER_SITE_OTHER): Payer: PPO | Admitting: *Deleted

## 2021-10-31 DIAGNOSIS — I2510 Atherosclerotic heart disease of native coronary artery without angina pectoris: Secondary | ICD-10-CM

## 2021-10-31 DIAGNOSIS — I10 Essential (primary) hypertension: Secondary | ICD-10-CM

## 2021-10-31 DIAGNOSIS — E1165 Type 2 diabetes mellitus with hyperglycemia: Secondary | ICD-10-CM

## 2021-10-31 NOTE — Chronic Care Management (AMB) (Signed)
Chronic Care Management   CCM RN Visit Note  10/31/2021 Name: [name] MRN: [id number] DOB: 08/27/1931  Subjective: [name] is a 88 y.o. year old male who is a primary care patient of [name], [name], MD. The care management team was consulted for assistance with disease management and care coordination needs.    Engaged with patient by telephone for initial visit in response to provider referral for case management and/or care coordination services.   Consent to Services:  The patient was given the following information about Chronic Care Management services today, agreed to services, and gave verbal consent: 1. CCM service includes personalized support from designated clinical staff supervised by the primary care provider, including individualized plan of care and coordination with other care providers 2. 24/7 contact phone numbers for assistance for urgent and routine care needs. 3. Service will only be billed when office clinical staff spend 20 minutes or more in a month to coordinate care. 4. Only one practitioner may furnish and bill the service in a calendar month. 5.The patient may stop CCM services at any time (effective at the end of the month) by phone call to the office staff. 6. The patient will be responsible for cost sharing (co-pay) of up to 20% of the service fee (after annual deductible is met). Patient agreed to services and consent obtained.  Patient agreed to services and verbal consent obtained.   Assessment: Review of patient past medical history, allergies, medications, health status, including review of consultants reports, laboratory and other test data, was performed as part of comprehensive evaluation and provision of chronic care management services.   SDOH (Social Determinants of Health) assessments and interventions performed:  SDOH Interventions    Flowsheet Row Most Recent Value  SDOH Interventions   Food Insecurity Interventions  Intervention Not Indicated  Transportation Interventions Intervention Not Indicated        CCM Care Plan  Allergies  Allergen Reactions   Tape Other (See Comments)    PLEASE USE EASY-RELEASE TAPE!!!! THE SKIN IS VERY SENSITIVE!!    Outpatient Encounter Medications as of 10/31/2021  Medication Sig   aspirin 81 MG EC tablet Take 1 tablet (81 mg total) by mouth daily. Swallow whole.   atenolol (TENORMIN) 50 MG tablet TAKE 1 TABLET BY MOUTH EVERY DAY   bisacodyl (DULCOLAX) 10 MG suppository Place 1 suppository (10 mg total) rectally as needed for moderate constipation.   furosemide (LASIX) 20 MG tablet TAKE 1 TABLET BY MOUTH EVERY DAY   glipiZIDE (GLUCOTROL XL) 10 MG 24 hr tablet TAKE 1 TABLET BY MOUTH EVERY DAY WITH BREAKFAST   Glucose Blood (BLOOD GLUCOSE TEST STRIPS) STRP Please dispense as One touch Ultra Blue. Use as directed to monitor FSBS 3x daily. Dx: E11.9.   LINZESS 145 MCG CAPS capsule TAKE 1 CAPSULE BY MOUTH DAILY BEFORE BREAKFAST.   pioglitazone (ACTOS) 30 MG tablet TAKE 1 TABLET BY MOUTH EVERY DAY   TRULICITY 6.73 AL/9.3XT SOPN INJECT 0.75 MG INTO THE SKIN ONCE A WEEK.   albuterol (VENTOLIN HFA) 108 (90 Base) MCG/ACT inhaler Inhale 2 puffs into the lungs every 6 (six) hours as needed for wheezing or shortness of breath. (Patient not taking: Reported on 03/24/2021)   No facility-administered encounter medications on file as of 10/31/2021.    Patient Active Problem List   Diagnosis Date Noted   Constipation 10/09/2020   Acute respiratory failure due to COVID-19 [facility]) 09/05/2020   AKI (acute kidney injury) ([place]) 09/05/2020  Hard of hearing 09/05/2020   Infection of skin of neck 06/26/2020   Aftercare following surgery of the circulatory system, NEC 05/15/2014   Carotid artery occlusion 02/23/2012   HTN (hypertension) 01/13/2011   Diabetes mellitus type II, uncontrolled 01/13/2011   BPH (benign prostatic hyperplasia) 01/13/2011   ASCVD (arteriosclerotic cardiovascular  disease) 01/13/2011   History of CEA (carotid endarterectomy) 01/13/2011   Tobacco abuse, 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    Conditions to be addressed/monitored:CAD, HTN, and DMII  Care Plan : RN Care Manager Plan of Care  Updates made by Kassie Mends, RN since 10/31/2021 12:00 AM     Problem: No plan of care established for management of chronic disease states  (DM2, ASCVD with HTN)   Priority: High     Long-Range Goal: Development of plan of care for chronic disease management  (DM2, ASCVD with HTN)   Start Date: 10/31/2021  Expected End Date: 04/29/2022  Priority: High  Note:   Current Barriers:  Knowledge Deficits related to plan of care for management of CAD, HTN, and DMII  Spoke with patient and spouse [name] (permission given by pt) as patient is hard of hearing, spouse reports pt is overall independent with his care, still drives around town and close to home, gets outside and walks. Patient/ spouse reports checks CBG once daily with most readings in low 200's range, does not eat a special diet, tries to avoid fast food, does drink sweet tea and sodas at times. Blood pressure checked on occasion by adult daughter (she is a Marine scientist)  RNCM Clinical Goal(s):  Patient will verbalize understanding of plan for management of CAD, HTN, and DMII as evidenced by patient report, review of EHR and  through collaboration with RN Care manager, provider, and care team.   Interventions: 1:1 collaboration with primary care provider regarding development and update of comprehensive plan of care as evidenced by provider attestation and co-signature Inter-disciplinary care team collaboration (see longitudinal plan of care) Evaluation of current treatment plan related to  self management and patient's adherence to plan as established by provider   CAD Interventions: (Status:  New goal. and Goal on track:  Yes.) Long Term Goal Assessed understanding of CAD diagnosis Medications reviewed  including medications utilized in CAD treatment plan Provided education on importance of blood pressure control in management of CAD Reviewed Importance of taking all medications as prescribed Screening for signs and symptoms of depression related to chronic disease state Assessed social determinant of health barriers Reviewed importance of adherence to heart healthy diet  Diabetes Interventions:  (Status:  New goal. and Goal on track:  Yes.) Long Term Goal Assessed patient's understanding of A1c goal: <7% Provided education to patient about basic DM disease process Reviewed medications with patient and discussed importance of medication adherence Counseled on importance of regular laboratory monitoring as prescribed Discussed plans with patient for ongoing care management follow up and provided patient with direct contact information for care management team Provided patient with written educational materials related to hypo and hyperglycemia and importance of correct treatment Review of patient status, including review of consultants reports, relevant laboratory and other test results, and medications completed Reviewed carbohydrate modified diet Reviewed importance of staying active Lab Results  Component Value Date   HGBA1C 10.6 (H) 06/04/2020   Hypertension Interventions:  (Status:  New goal. and Goal on track:  Yes.) Long Term Goal Last practice recorded BP readings:  BP Readings from Last 3 Encounters:  08/26/21 (!) 170/83  04/22/21 [name] Kitchen)  177/99  04/08/21 135/78  Most recent eGFR/CrCl: No results found for: EGFR  No components found for: CRCL  Evaluation of current treatment plan related to hypertension self management and patient's adherence to plan as established by provider Provided education to patient re: stroke prevention, s/s of heart attack and stroke Reviewed medications with patient and discussed importance of compliance Discussed plans with patient for ongoing care  management follow up and provided patient with direct contact information for care management team Advised patient, providing education and rationale, to monitor blood pressure daily and record, calling PCP for findings outside established parameters Education sent via My Chart- low sodium diet Reviewed importance of following low sodium diet, limiting/ avoiding salty snacks, foods high in sodium content   Patient Goals/Self-Care Activities: Take medications as prescribed   Attend all scheduled provider appointments Perform all self care activities independently  Call provider office for new concerns or questions  check blood sugar at prescribed times: once daily check feet daily for cuts, sores or redness enter blood sugar readings and medication or insulin into daily log take the blood sugar log to all doctor visits take the blood sugar meter to all doctor visits read food labels for fat, fiber, carbohydrates and portion size check blood pressure weekly choose a place to take my blood pressure (home, clinic or office, retail store) write blood pressure results in a log or diary keep a blood pressure log take blood pressure log to all doctor appointments take medications for blood pressure exactly as prescribed Follow low sodium diet Be mindful of carbohydrate intake at each meal- too much bread, pasta, rice, potatoes, etc will elevate blood sugar Look over education sent via My Chart- low sodium diet and hypoglycemia       Plan:Telephone follow up appointment with care management team member scheduled for:  12/26/2021   Jacqlyn Larsen [facility], BSN RN Case Manager [facility] [phone]

## 2021-10-31 NOTE — Patient Instructions (Signed)
Visit Information   Thank you for taking time to visit with me today. Please don't hesitate to contact me if I can be of assistance to you before our next scheduled telephone appointment.  Following are the goals we discussed today:  Take medications as prescribed   Attend all scheduled provider appointments Perform all self care activities independently  Call provider office for new concerns or questions  check blood sugar at prescribed times: once daily check feet daily for cuts, sores or redness enter blood sugar readings and medication or insulin into daily log take the blood sugar log to all doctor visits take the blood sugar meter to all doctor visits read food labels for fat, fiber, carbohydrates and portion size check blood pressure weekly choose a place to take my blood pressure (home, clinic or office, retail store) write blood pressure results in a log or diary keep a blood pressure log take blood pressure log to all doctor appointments take medications for blood pressure exactly as prescribed Follow low sodium diet Be mindful of carbohydrate intake at each meal- too much bread, pasta, rice, potatoes, etc will elevate blood sugar Look over education sent via My Chart- low sodium diet and hypoglycemia  Our next appointment is by telephone on 12/26/2021 at 9 am  Please call the care guide team at [phone] if you need to cancel or reschedule your appointment.   If you are experiencing a Mental Health or [place] or need someone to talk to, please call the Suicide and Crisis Lifeline: 988 call the [place] National Suicide Prevention Lifeline: (769)615-8893 or TTY: 203 195 1236 TTY (563)395-3807) to talk to a trained counselor call 1-800-273-TALK (toll free, 24 hour hotline) go to [facility] [address] [place] [phone]) call 911   Following is a copy of your full care plan:  Care Plan : [place]  of Care  Updates made by Kassie Mends, RN since 10/31/2021 12:00 AM     Problem: No plan of care established for management of chronic disease states  (DM2, ASCVD with HTN)   Priority: High     Long-Range Goal: Development of plan of care for chronic disease management  (DM2, ASCVD with HTN)   Start Date: 10/31/2021  Expected End Date: 04/29/2022  Priority: High  Note:   Current Barriers:  Knowledge Deficits related to plan of care for management of CAD, HTN, and DMII  Spoke with patient and spouse [name] (permission given by pt) as patient is hard of hearing, spouse reports pt is overall independent with his care, still drives around town and close to home, gets outside and walks. Patient/ spouse reports checks CBG once daily with most readings in low 200's range, does not eat a special diet, tries to avoid fast food, does drink sweet tea and sodas at times. Blood pressure checked on occasion by adult daughter (she is a Marine scientist)  RNCM Clinical Goal(s):  Patient will verbalize understanding of plan for management of CAD, HTN, and DMII as evidenced by patient report, review of EHR and  through collaboration with RN Care manager, provider, and care team.   Interventions: 1:1 collaboration with primary care provider regarding development and update of comprehensive plan of care as evidenced by provider attestation and co-signature Inter-disciplinary care team collaboration (see longitudinal plan of care) Evaluation of current treatment plan related to  self management and patient's adherence to plan as established by provider   CAD Interventions: (Status:  New goal. and Goal on track:  Yes.) Long Term Goal Assessed understanding of CAD diagnosis Medications reviewed including medications utilized in CAD treatment plan Provided education on importance of blood pressure control in management of CAD Reviewed Importance of taking all medications as prescribed Screening for signs and  symptoms of depression related to chronic disease state Assessed social determinant of health barriers Reviewed importance of adherence to heart healthy diet  Diabetes Interventions:  (Status:  New goal. and Goal on track:  Yes.) Long Term Goal Assessed patient's understanding of A1c goal: <7% Provided education to patient about basic DM disease process Reviewed medications with patient and discussed importance of medication adherence Counseled on importance of regular laboratory monitoring as prescribed Discussed plans with patient for ongoing care management follow up and provided patient with direct contact information for care management team Provided patient with written educational materials related to hypo and hyperglycemia and importance of correct treatment Review of patient status, including review of consultants reports, relevant laboratory and other test results, and medications completed Reviewed carbohydrate modified diet Reviewed importance of staying active Lab Results  Component Value Date   HGBA1C 10.6 (H) 06/04/2020   Hypertension Interventions:  (Status:  New goal. and Goal on track:  Yes.) Long Term Goal Last practice recorded BP readings:  BP Readings from Last 3 Encounters:  08/26/21 (!) 170/83  04/22/21 (!) 177/99  04/08/21 135/78  Most recent eGFR/CrCl: No results found for: EGFR  No components found for: CRCL  Evaluation of current treatment plan related to hypertension self management and patient's adherence to plan as established by provider Provided education to patient re: stroke prevention, s/s of heart attack and stroke Reviewed medications with patient and discussed importance of compliance Discussed plans with patient for ongoing care management follow up and provided patient with direct contact information for care management team Advised patient, providing education and rationale, to monitor blood pressure daily and record, calling PCP for findings  outside established parameters Education sent via My Chart- low sodium diet Reviewed importance of following low sodium diet, limiting/ avoiding salty snacks, foods high in sodium content   Patient Goals/Self-Care Activities: Take medications as prescribed   Attend all scheduled provider appointments Perform all self care activities independently  Call provider office for new concerns or questions  check blood sugar at prescribed times: once daily check feet daily for cuts, sores or redness enter blood sugar readings and medication or insulin into daily log take the blood sugar log to all doctor visits take the blood sugar meter to all doctor visits read food labels for fat, fiber, carbohydrates and portion size check blood pressure weekly choose a place to take my blood pressure (home, clinic or office, retail store) write blood pressure results in a log or diary keep a blood pressure log take blood pressure log to all doctor appointments take medications for blood pressure exactly as prescribed Follow low sodium diet Be mindful of carbohydrate intake at each meal- too much bread, pasta, rice, potatoes, etc will elevate blood sugar Look over education sent via My Chart- low sodium diet and hypoglycemia       Consent to CCM Services: [name] was given information about Chronic Care Management services including:  CCM service includes personalized support from designated clinical staff supervised by his physician, including individualized plan of care and coordination with other care providers 24/7 contact phone numbers for assistance for urgent and routine care needs. Service will only be billed when office clinical  staff spend 20 minutes or more in a month to coordinate care. Only one practitioner may furnish and bill the service in a calendar month. The patient may stop CCM services at any time (effective at the end of the month) by phone call to the office staff. The  patient will be responsible for cost sharing (co-pay) of up to 20% of the service fee (after annual deductible is met).  Patient agreed to services and verbal consent obtained.   Patient verbalizes understanding of instructions and care plan provided today and agrees to view in [place]. Active MyChart status confirmed with patient.    Telephone follow up appointment with care management team member scheduled for:  12/26/2021 Low-Sodium Eating Plan Sodium, which is an element that makes up salt, helps you maintain a healthy balance of fluids in your body. Too much sodium can increase your blood pressure and cause fluid and waste to be held in your body. Your health care provider or dietitian may recommend following this plan if you have high blood pressure (hypertension), kidney disease, liver disease, or heart failure. Eating less sodium can help lower your blood pressure, reduce swelling, and protect your heart, liver, and kidneys. What are tips for following this plan? Reading food labels The Nutrition Facts label lists the amount of sodium in one serving of the food. If you eat more than one serving, you must multiply the listed amount of sodium by the number of servings. Choose foods with less than 140 mg of sodium per serving. Avoid foods with 300 mg of sodium or more per serving. Shopping  Look for lower-sodium products, often labeled as "low-sodium" or "no salt added." Always check the sodium content, even if foods are labeled as "unsalted" or "no salt added." Buy fresh foods. Avoid canned foods and pre-made or frozen meals. Avoid canned, cured, or processed meats. Buy breads that have less than 80 mg of sodium per slice. Cooking  Eat more home-cooked food and less restaurant, buffet, and fast food. Avoid adding salt when cooking. Use salt-free seasonings or herbs instead of table salt or sea salt. Check with your health care provider or pharmacist before using salt substitutes. Cook  with plant-based oils, such as canola, sunflower, or olive oil. Meal planning When eating at a restaurant, ask that your food be prepared with less salt or no salt, if possible. Avoid dishes labeled as brined, pickled, cured, smoked, or made with soy sauce, miso, or teriyaki sauce. Avoid foods that contain MSG (monosodium glutamate). MSG is sometimes added to [place] food, bouillon, and some canned foods. Make meals that can be grilled, baked, poached, roasted, or steamed. These are generally made with less sodium. General information Most people on this plan should limit their sodium intake to 1,500-2,000 mg (milligrams) of sodium each day. What foods should I eat? Fruits Fresh, frozen, or canned fruit. Fruit juice. Vegetables Fresh or frozen vegetables. "No salt added" canned vegetables. "No salt added" tomato sauce and paste. Low-sodium or reduced-sodium tomato and vegetable juice. Grains Low-sodium cereals, including oats, puffed wheat and rice, and shredded wheat. Low-sodium crackers. Unsalted rice. Unsalted pasta. Low-sodium bread. Whole-grain breads and whole-grain pasta. Meats and other proteins Fresh or frozen (no salt added) meat, poultry, seafood, and fish. Low-sodium canned tuna and salmon. Unsalted nuts. Dried peas, beans, and lentils without added salt. Unsalted canned beans. Eggs. Unsalted nut butters. Dairy Milk. Soy milk. Cheese that is naturally low in sodium, such as ricotta cheese, fresh mozzarella, or Swiss cheese. Low-sodium  or reduced-sodium cheese. Cream cheese. Yogurt. Seasonings and condiments Fresh and dried herbs and spices. Salt-free seasonings. Low-sodium mustard and ketchup. Sodium-free salad dressing. Sodium-free light mayonnaise. Fresh or refrigerated horseradish. Lemon juice. Vinegar. Other foods Homemade, reduced-sodium, or low-sodium soups. Unsalted popcorn and pretzels. Low-salt or salt-free chips. The items listed above may not be a complete list of foods  and beverages you can eat. Contact a dietitian for more information. What foods should I avoid? Vegetables Sauerkraut, pickled vegetables, and relishes. Olives. [place] fries. Onion rings. Regular canned vegetables (not low-sodium or reduced-sodium). Regular canned tomato sauce and paste (not low-sodium or reduced-sodium). Regular tomato and vegetable juice (not low-sodium or reduced-sodium). Frozen vegetables in sauces. Grains Instant hot cereals. Bread stuffing, pancake, and biscuit mixes. Croutons. Seasoned rice or pasta mixes. Noodle soup cups. Boxed or frozen macaroni and cheese. Regular salted crackers. Self-rising flour. Meats and other proteins Meat or fish that is salted, canned, smoked, spiced, or pickled. Precooked or cured meat, such as sausages or meat loaves. Berniece Salines. Ham. Pepperoni. Hot dogs. Corned beef. Chipped beef. Salt pork. Jerky. Pickled herring. Anchovies and sardines. Regular canned tuna. Salted nuts. Dairy Processed cheese and cheese spreads. Hard cheeses. Cheese curds. Blue cheese. Feta cheese. String cheese. Regular cottage cheese. Buttermilk. Canned milk. Fats and oils Salted butter. Regular margarine. Ghee. Bacon fat. Seasonings and condiments Onion salt, garlic salt, seasoned salt, table salt, and sea salt. Canned and packaged gravies. Worcestershire sauce. Tartar sauce. Barbecue sauce. Teriyaki sauce. Soy sauce, including reduced-sodium. Steak sauce. Fish sauce. Oyster sauce. Cocktail sauce. Horseradish that you find on the shelf. Regular ketchup and mustard. Meat flavorings and tenderizers. Bouillon cubes. Hot sauce. Pre-made or packaged marinades. Pre-made or packaged taco seasonings. Relishes. Regular salad dressings. Salsa. Other foods Salted popcorn and pretzels. Corn chips and puffs. Potato and tortilla chips. Canned or dried soups. Pizza. Frozen entrees and pot pies. The items listed above may not be a complete list of foods and beverages you should avoid. Contact a  dietitian for more information. Summary Eating less sodium can help lower your blood pressure, reduce swelling, and protect your heart, liver, and kidneys. Most people on this plan should limit their sodium intake to 1,500-2,000 mg (milligrams) of sodium each day. Canned, boxed, and frozen foods are high in sodium. Restaurant foods, fast foods, and pizza are also very high in sodium. You also get sodium by adding salt to food. Try to cook at home, eat more fresh fruits and vegetables, and eat less fast food and canned, processed, or prepared foods. This information is not intended to replace advice given to you by your health care provider. Make sure you discuss any questions you have with your health care provider. Document Revised: 11/03/2019 Document Reviewed: 08/30/2019 Elsevier Patient Education  2022 [place]. Hypoglycemia Hypoglycemia is when the sugar (glucose) level in your blood is too low. Low blood sugar can happen to people who have diabetes and people who do not have diabetes. Low blood sugar can happen quickly, and it can be an emergency. What are the causes? This condition happens most often in people who have diabetes. It may be caused by: Diabetes medicine. Not eating enough, or not eating often enough. Doing more physical activity. Drinking alcohol on an empty stomach. If you do not have diabetes, this condition may be caused by: A tumor in the pancreas. Not eating enough, or not eating for long periods at a time (fasting). A very bad infection or illness. Problems after having weight  loss (bariatric) surgery. Kidney failure or liver failure. Certain medicines. What increases the risk? This condition is more likely to develop in people who: Have diabetes and take medicines to lower their blood sugar. Abuse alcohol. Have a very bad illness. What are the signs or symptoms? Mild Hunger. Sweating and feeling clammy. Feeling dizzy or light-headed. Being sleepy or  having trouble sleeping. Feeling like you may vomit (nauseous). A fast heartbeat. A headache. Blurry vision. Mood changes, such as: Being grouchy. Feeling worried or nervous (anxious). Tingling or loss of feeling (numbness) around your mouth, lips, or tongue. Moderate Confusion and poor judgment. Behavior changes. Weakness. Uneven heartbeat. Trouble with moving (coordination). Very low Very low blood sugar (severe hypoglycemia) is a medical emergency. It can cause: Fainting. Seizures. Loss of consciousness (coma). Death. How is this treated? Treating low blood sugar Low blood sugar is often treated by eating or drinking something that has sugar in it right away. The food or drink should contain 15 grams of a fast-acting carb (carbohydrate). Options include: 4 oz (120 mL) of fruit juice. 4 oz (120

## 2021-11-04 ENCOUNTER — Other Ambulatory Visit: Payer: Self-pay | Admitting: Physician Assistant

## 2021-11-11 DIAGNOSIS — I2510 Atherosclerotic heart disease of native coronary artery without angina pectoris: Secondary | ICD-10-CM | POA: Diagnosis not present

## 2021-11-11 DIAGNOSIS — E1165 Type 2 diabetes mellitus with hyperglycemia: Secondary | ICD-10-CM | POA: Diagnosis not present

## 2021-11-11 DIAGNOSIS — I10 Essential (primary) hypertension: Secondary | ICD-10-CM

## 2021-11-14 ENCOUNTER — Ambulatory Visit: Payer: PPO

## 2021-11-21 ENCOUNTER — Ambulatory Visit (INDEPENDENT_AMBULATORY_CARE_PROVIDER_SITE_OTHER): Payer: PPO

## 2021-11-21 ENCOUNTER — Other Ambulatory Visit: Payer: Self-pay

## 2021-11-21 VITALS — BP 126/68 | HR 98 | Ht 64.0 in | Wt 180.0 lb

## 2021-11-21 DIAGNOSIS — Z0000 Encounter for general adult medical examination without abnormal findings: Secondary | ICD-10-CM | POA: Diagnosis not present

## 2021-11-21 NOTE — Progress Notes (Signed)
Subjective:   [name] is a 88 y.o. male who presents for an Initial Medicare Annual Wellness Visit.  Review of Systems     Cardiac Risk Factors include: advanced age (>54men, >49 women);diabetes mellitus;hypertension;sedentary lifestyle;obesity (BMI >30kg/m2);male gender  IN OFFICE VISIT.    Objective:    Today's Vitals   11/21/21 1109 11/21/21 1117  BP: 126/68   Pulse: 98   SpO2: 96%   Weight: 180 lb (81.6 kg)   Height: 5\' 4"  (1.626 m)   PainSc:  0-No pain   Body mass index is 30.9 kg/m.  Advanced Directives 11/21/2021 10/31/2021 04/22/2021 04/08/2021 06/19/2020 05/16/2019 01/12/2017  Does Patient Have a Medical Advance Directive? Yes Yes Yes Yes No No No  Type of Paramedic of [place];Living will Living will;Healthcare Power of [place];Living will - - -  Does patient want to make changes to medical advance directive? - No - Patient declined No - Patient declined No - Patient declined - - -  Copy of [place] in Chart? Yes - validated most recent copy scanned in chart (See row information) No - copy requested No - copy requested Yes - validated most recent copy scanned in chart (See row information) - - -  Would patient like information on creating a medical advance directive? - - - - No - Patient declined No - Patient declined No - Patient declined  Pre-existing out of facility DNR order (yellow form or pink MOST form) - - - - - - -    Current Medications (verified) Outpatient Encounter Medications as of 11/21/2021  Medication Sig   albuterol (VENTOLIN HFA) 108 (90 Base) MCG/ACT inhaler Inhale 2 puffs into the lungs every 6 (six) hours as needed for wheezing or shortness of breath.   ASPIRIN LOW DOSE 81 MG EC tablet TAKE 1 TABLET (81 MG TOTAL) BY MOUTH DAILY. SWALLOW WHOLE.   atenolol (TENORMIN) 50 MG tablet TAKE 1 TABLET BY MOUTH EVERY DAY   bisacodyl (DULCOLAX) 10 MG  suppository Place 1 suppository (10 mg total) rectally as needed for moderate constipation.   furosemide (LASIX) 20 MG tablet TAKE 1 TABLET BY MOUTH EVERY DAY   glipiZIDE (GLUCOTROL XL) 10 MG 24 hr tablet TAKE 1 TABLET BY MOUTH EVERY DAY WITH BREAKFAST   Glucose Blood (BLOOD GLUCOSE TEST STRIPS) STRP Please dispense as One touch Ultra Blue. Use as directed to monitor FSBS 3x daily. Dx: E11.9.   LINZESS 145 MCG CAPS capsule TAKE 1 CAPSULE BY MOUTH DAILY BEFORE BREAKFAST.   pioglitazone (ACTOS) 30 MG tablet TAKE 1 TABLET BY MOUTH EVERY DAY   TRULICITY 3.41 PF/7.9KW SOPN INJECT 0.75 MG INTO THE SKIN ONCE A WEEK.   No facility-administered encounter medications on file as of 11/21/2021.    Allergies (verified) Tape   History: Past Medical History:  Diagnosis Date   Arthritis    knee   Carotid artery occlusion    Diabetes mellitus    metformin,januvia,and glipizide daily   Enlarged prostate    pt states no trouble with it   History of COVID-19 08/2020   Hyperlipidemia    takes Simvastatin daily   Hypertension    takes Atenolol and Lisinopril daily   Impaired hearing, left    but doesn't wear hearing aids   Seasonal allergies    Past Surgical History:  Procedure Laterality Date   APPENDECTOMY     as a child   CAROTID ANGIOGRAM  N/A 05/09/2013   Procedure: CAROTID ANGIOGRAM;  Surgeon: Serafina Mitchell, MD;  [facility] CATH LAB;  Service: Cardiovascular;  Laterality: N/A;   CAROTID ENDARTERECTOMY Right 2008    Re-do Mar 04, 2012   carotidendartectomy  2008   right side   CATARACT EXTRACTION W/PHACO Left 04/08/2021   Procedure: CATARACT EXTRACTION PHACO AND INTRAOCULAR LENS PLACEMENT (IOC) LEFT DIABETIC 15.64 01:28.9;  Surgeon: Birder Robson, MD;  Location: [place];  Service: Ophthalmology;  Laterality: Left;   CATARACT EXTRACTION W/PHACO Right 04/22/2021   Procedure: CATARACT EXTRACTION PHACO AND INTRAOCULAR LENS PLACEMENT (IOC) RIGHT DIABETIC 12.52 01:16.4;   Surgeon: Birder Robson, MD;  Location: [place];  Service: Ophthalmology;  Laterality: Right;   ENDARTERECTOMY  03/04/2012   Procedure: ENDARTERECTOMY CAROTID;  Surgeon: Rosetta Posner, MD;  Location: [facility] OR;  Service: Vascular;  Laterality: Right;  Redo Right Carotid Endarterectomy with hemasheild patch angioplasty   ENDARTERECTOMY Right 06/26/2020   Procedure: REDO CAROTID ENDARTERECTOMY;  Surgeon: Rosetta Posner, MD;  Location: [facility] OR;  Service: Vascular;  Laterality: Right;   WOUND EXPLORATION Right 06/26/2020   Procedure: EXPLORATION OF RIGHT NECK INCISION;  Surgeon: Rosetta Posner, MD;  Location: [facility] OR;  Service: Vascular;  Laterality: Right;   Family History  Problem Relation Age of Onset   Leukemia Mother    Asthma Father    Anesthesia problems Neg Hx    Hypotension Neg Hx    Malignant hyperthermia Neg Hx    Pseudochol deficiency Neg Hx    Colon cancer Neg Hx    Stomach cancer Neg Hx    Esophageal cancer Neg Hx    Pancreatic cancer Neg Hx    Social History   Socioeconomic History   Marital status: Married    Spouse name: Nadiene   Number of children: 2   Years of education: Not on file   Highest education level: Not on file  Occupational History   Not on file  Tobacco Use   Smoking status: Former    Years: 25.00    Types: Cigarettes    Quit date: 10/13/1991    Years since quitting: 30.1   Smokeless tobacco: Never  Vaping Use   Vaping Use: Never used  Substance and Sexual Activity   Alcohol use: No    Comment: occassional glass of wine   Drug use: No   Sexual activity: Not Currently  Other Topics Concern   Not on file  Social History Narrative   2 daughters.    Wife is currently undergoing cancer treatments.    Social Determinants of Health   Financial Resource Strain: Low Risk    Difficulty of Paying Living Expenses: Not hard at all  Food Insecurity: No Food Insecurity   Worried About Charity fundraiser in the Last Year: Never true   [place] in the Last Year: Never true  Transportation Needs: No Transportation Needs   Lack of Transportation (Medical): No   Lack of Transportation (Non-Medical): No  Physical Activity: Inactive   Days of Exercise per Week: 0 days   Minutes of Exercise per Session: 0 min  Stress: No Stress Concern Present   Feeling of Stress : Not at all  Social Connections: Socially Integrated   Frequency of Communication with Friends and Family: More than three times a week   Frequency of Social Gatherings with Friends and Family: More than three times a week   Attends Religious Services: 1 to 4 times  per year   Active Member of Clubs or Organizations: Yes   Attends Archivist Meetings: 1 to 4 times per year   Marital Status: Married    Tobacco Counseling Counseling given: Not Answered   Clinical Intake:  Pre-visit preparation completed: Yes  Pain : No/denies pain Pain Score: 0-No pain     BMI - recorded: 30.9 Nutritional Status: BMI > 30  Obese Nutritional Risks: None Diabetes: Yes  How often do you need to have someone help you when you read instructions, pamphlets, or other written materials from your doctor or pharmacy?: 1 - Never  Diabetic?Nutrition Risk Assessment:  Has the patient had any N/V/D within the last 2 months?  No  Does the patient have any non-healing wounds?  No  Has the patient had any unintentional weight loss or weight gain?  No   Diabetes:  Is the patient diabetic?  Yes  If diabetic, was a CBG obtained today?  No  Did the patient bring in their glucometer from home?  No  How often do you monitor your CBG's? 2-3 x per month.   Financial Strains and Diabetes Management:  Are you having any financial strains with the device, your supplies or your medication? No .  Does the patient want to be seen by Chronic Care Management for management of their diabetes?  No  Would the patient like to be referred to a Nutritionist or for Diabetic Management?  No    Diabetic Exams:  Diabetic Eye Exam: Completed 2022. Pt has been advised about the importance in completing this exam.  Diabetic Foot Exam: Completed 05/20/2017. Pt has been advised about the importance in completing this exam.  Interpreter Needed?: No  Information entered by :: mj Suresh Audi, lpn   Activities of Daily Living In your present state of health, do you have any difficulty performing the following activities: 11/21/2021 04/22/2021  Hearing? Y N  Comment Pt is very hard of hearing. -  Vision? N N  Difficulty concentrating or making decisions? N N  Walking or climbing stairs? N N  Dressing or bathing? N N  Doing errands, shopping? N -  Preparing Food and eating ? N -  Using the Toilet? N -  In the past six months, have you accidently leaked urine? N -  Do you have problems with loss of bowel control? N -  Managing your Medications? N -  Managing your Finances? N -  Housekeeping or managing your Housekeeping? N -  Some recent data might be hidden    Patient Care Team: Susy Frizzle, MD as PCP - General (Family Medicine) Kassie Mends, RN as [place] any recent [place] you may have received from other than [facility] providers in the past year (date may be approximate).     Assessment:   This is a routine wellness examination for [place].  Hearing/Vision screen Hearing Screening - Comments:: VERY HARD OF HEARING Vision Screening - Comments:: Eye md in [place]  Dietary issues and exercise activities discussed: Current Exercise Habits: The patient does not participate in regular exercise at present, Exercise limited by: cardiac condition(s)   Goals Addressed             This Visit's Progress    Exercise 3x per week (30 min per time)       Increase exercise as tolerated.       Depression Screen PHQ 2/9 Scores 11/21/2021 10/31/2021 09/13/2014  PHQ - 2  Score 0 0 0    Fall Risk Fall Risk  11/21/2021 10/31/2021  10/09/2020 03/29/2018 09/13/2014  Falls in the past year? - 0 0 No No  Number falls in past yr: 0 - 0 - -  Injury with Fall? 0 - 0 - -  Risk for fall due to : Impaired balance/gait - Impaired balance/gait;Impaired mobility - -  Follow up Falls prevention discussed - - - -    FALL RISK PREVENTION PERTAINING TO THE HOME:  Any stairs in or around the home? Yes  If so, are there any without handrails? No  Home free of loose throw rugs in walkways, pet beds, electrical cords, etc? Yes  Adequate lighting in your home to reduce risk of falls? Yes   ASSISTIVE DEVICES UTILIZED TO PREVENT FALLS:  Life alert? No  Use of a cane, walker or w/c? No  Grab bars in the bathroom? Yes  Shower chair or bench in shower? Yes  Elevated toilet seat or a handicapped toilet? Yes   TIMED UP AND GO:  Was the test performed? Yes .  Length of time to ambulate 10 feet: 10 sec.   Gait steady and fast without use of assistive device  Cognitive Function:     6CIT Screen 11/21/2021  What Year? 0 points  What month? 0 points  What time? 0 points  Count back from 20 4 points  Months in reverse 4 points  Repeat phrase 10 points  Total Score 18    Immunizations Immunization History  Administered Date(s) Administered   Fluad Quad(high Dose 65+) 06/22/2019   Influenza, High Dose Seasonal PF 07/13/2018, 08/13/2020   Influenza,inj,Quad PF,6+ Mos 08/17/2014, 09/10/2016   PFIZER(Purple Top)SARS-COV-2 Vaccination 11/18/2019, 12/12/2019   Pneumococcal Conjugate-13 08/17/2014   Pneumococcal Polysaccharide-23 10/13/2003    TDAP status: Due, Education has been provided regarding the importance of this vaccine. Advised may receive this vaccine at local pharmacy or Health Dept. Aware to provide a copy of the vaccination record if obtained from local pharmacy or Health Dept. Verbalized acceptance and understanding.  Flu Vaccine status: Up to date  Pneumococcal vaccine status: Up to date  Covid-19 vaccine status:  Completed vaccines  Qualifies for Shingles Vaccine? Yes   Zostavax completed No   Shingrix Completed?: No.    Education has been provided regarding the importance of this vaccine. Patient has been advised to call insurance company to determine out of pocket expense if they have not yet received this vaccine. Advised may also receive vaccine at local pharmacy or Health Dept. Verbalized acceptance and understanding.  Screening Tests Health Maintenance  Topic Date Due   TETANUS/TDAP  Never done   Zoster Vaccines- Shingrix (1 of 2) Never done   FOOT EXAM  05/20/2018   URINE MICROALBUMIN  03/30/2019   COVID-19 Vaccine (3 - Booster for Pfizer series) 02/06/2020   HEMOGLOBIN A1C  12/05/2020   INFLUENZA VACCINE  05/12/2021   OPHTHALMOLOGY EXAM  03/05/2022   Pneumonia Vaccine 11+ Years old  Completed   HPV VACCINES  Aged Out    Health Maintenance  Health Maintenance Due  Topic Date Due   TETANUS/TDAP  Never done   Zoster Vaccines- Shingrix (1 of 2) Never done   FOOT EXAM  05/20/2018   URINE MICROALBUMIN  03/30/2019   COVID-19 Vaccine (3 - Booster for Pfizer series) 02/06/2020   HEMOGLOBIN A1C  12/05/2020   INFLUENZA VACCINE  05/12/2021    Colorectal cancer screening: No longer required.   Lung Cancer Screening: (  Low Dose CT Chest recommended if Age 52-80 years, 30 pack-year currently smoking OR have quit w/in 15years.) does not qualify.   Additional Screening:  Hepatitis C Screening: does not qualify;   Vision Screening: Recommended annual ophthalmology exams for early detection of glaucoma and other disorders of the eye. Is the patient up to date with their annual eye exam?  Yes  Who is the provider or what is the name of the office in which the patient attends annual eye exams? Optometrist in [place] per pt. If pt is not established with a provider, would they like to be referred to a provider to establish care? No .   Dental Screening: Recommended annual dental exams for  proper oral hygiene  Community Resource Referral / Chronic Care Management: CRR required this visit?  No   CCM required this visit?  No      Plan:     I have personally reviewed and noted the following in the patients chart:   Medical and social history Use of alcohol, tobacco or illicit drugs  Current medications and supplements including opioid prescriptions. Patient is not currently taking opioid prescriptions. Functional ability and status Nutritional status Physical activity Advanced directives List of other physicians Hospitalizations, surgeries, and ER visits in previous 12 months Vitals Screenings to include cognitive, depression, and falls Referrals and appointments  In addition, I have reviewed and discussed with patient certain preventive protocols, quality metrics, and best practice recommendations. A written personalized care plan for preventive services as well as general preventive health recommendations were provided to patient.     Chriss Driver, LPN   [id number]   Nurse Notes: Overdue for diabetic foot exam. Discussed shingrix vaccine and how to obtain.

## 2021-11-21 NOTE — Patient Instructions (Signed)
[name] , Thank you for taking time to come for your Medicare Wellness Visit. I appreciate your ongoing commitment to your health goals. Please review the following plan we discussed and let me know if I can assist you in the future.   Screening recommendations/referrals: Colonoscopy: No longer required due to age.  Recommended yearly ophthalmology/optometry visit for glaucoma screening and checkup Recommended yearly dental visit for hygiene and checkup  Vaccinations: Influenza vaccine: 2022. Repeat annually  Pneumococcal vaccine: Done 10/13/2003 and 08/17/2014 Tdap vaccine: Due. Repeat in 10 years  Shingles vaccine: Discussed.   Covid-19: Declined.   Advanced directives: Please bring a copy of your health care power of attorney and living will to the office to be added to your chart at your convenience.   Conditions/risks identified: Aim for 30 minutes of exercise or brisk walking each day, drink 6-8 glasses of water and eat lots of fruits and vegetables.   Next appointment: Follow up in one year for your annual wellness visit. 2024   Preventive Care 86 Years and Older, Male  Preventive care refers to lifestyle choices and visits with your health care provider that can promote health and wellness. What does preventive care include? A yearly physical exam. This is also called an annual well check. Dental exams once or twice a year. Routine eye exams. Ask your health care provider how often you should have your eyes checked. Personal lifestyle choices, including: Daily care of your teeth and gums. Regular physical activity. Eating a healthy diet. Avoiding tobacco and drug use. Limiting alcohol use. Practicing safe sex. Taking low doses of aspirin every day. Taking vitamin and mineral supplements as recommended by your health care provider. What happens during an annual well check? The services and screenings done by your health care provider during your annual well check  will depend on your age, overall health, lifestyle risk factors, and family history of disease. Counseling  Your health care provider may ask you questions about your: Alcohol use. Tobacco use. Drug use. Emotional well-being. Home and relationship well-being. Sexual activity. Eating habits. History of falls. Memory and ability to understand (cognition). Work and work Statistician. Screening  You may have the following tests or measurements: Height, weight, and BMI. Blood pressure. Lipid and cholesterol levels. These may be checked every 5 years, or more frequently if you are over 24 years old. Skin check. Lung cancer screening. You may have this screening every year starting at age 24 if you have a 30-pack-year history of smoking and currently smoke or have quit within the past 15 years. Fecal occult blood test (FOBT) of the stool. You may have this test every year starting at age 72. Flexible sigmoidoscopy or colonoscopy. You may have a sigmoidoscopy every 5 years or a colonoscopy every 10 years starting at age 21. Prostate cancer screening. Recommendations will vary depending on your family history and other risks. Hepatitis C blood test. Hepatitis B blood test. Sexually transmitted disease (STD) testing. Diabetes screening. This is done by checking your blood sugar (glucose) after you have not eaten for a while (fasting). You may have this done every 1-3 years. Abdominal aortic aneurysm (AAA) screening. You may need this if you are a current or former smoker. Osteoporosis. You may be screened starting at age 76 if you are at high risk. Talk with your health care provider about your test results, treatment options, and if necessary, the need for more tests. Vaccines  Your health care provider may recommend certain vaccines, such  as: Influenza vaccine. This is recommended every year. Tetanus, diphtheria, and acellular pertussis (Tdap, Td) vaccine. You may need a Td booster every 10  years. Zoster vaccine. You may need this after age 54. Pneumococcal 13-valent conjugate (PCV13) vaccine. One dose is recommended after age 79. Pneumococcal polysaccharide (PPSV23) vaccine. One dose is recommended after age 57. Talk to your health care provider about which screenings and vaccines you need and how often you need them. This information is not intended to replace advice given to you by your health care provider. Make sure you discuss any questions you have with your health care provider. Document Released: 10/25/2015 Document Revised: 06/17/2016 Document Reviewed: 07/30/2015 Elsevier Interactive Patient Education  2017 [place] Prevention in the Home Falls can cause injuries. They can happen to people of all ages. There are many things you can do to make your home safe and to help prevent falls. What can I do on the outside of my home? Regularly fix the edges of walkways and driveways and fix any cracks. Remove anything that might make you trip as you walk through a door, such as a raised step or threshold. Trim any bushes or trees on the path to your home. Use bright outdoor lighting. Clear any walking paths of anything that might make someone trip, such as rocks or tools. Regularly check to see if handrails are loose or broken. Make sure that both sides of any steps have handrails. Any raised decks and porches should have guardrails on the edges. Have any leaves, snow, or ice cleared regularly. Use sand or salt on walking paths during winter. Clean up any spills in your garage right away. This includes oil or grease spills. What can I do in the bathroom? Use night lights. Install grab bars by the toilet and in the tub and shower. Do not use towel bars as grab bars. Use non-skid mats or decals in the tub or shower. If you need to sit down in the shower, use a plastic, non-slip stool. Keep the floor dry. Clean up any water that spills on the floor as soon as it  happens. Remove soap buildup in the tub or shower regularly. Attach bath mats securely with double-sided non-slip rug tape. Do not have throw rugs and other things on the floor that can make you trip. What can I do in the bedroom? Use night lights. Make sure that you have a light by your bed that is easy to reach. Do not use any sheets or blankets that are too big for your bed. They should not hang down onto the floor. Have a firm chair that has side arms. You can use this for support while you get dressed. Do not have throw rugs and other things on the floor that can make you trip. What can I do in the kitchen? Clean up any spills right away. Avoid walking on wet floors. Keep items that you use a lot in easy-to-reach places. If you need to reach something above you, use a strong step stool that has a grab bar. Keep electrical cords out of the way. Do not use floor polish or wax that makes floors slippery. If you must use wax, use non-skid floor wax. Do not have throw rugs and other things on the floor that can make you trip. What can I do with my stairs? Do not leave any items on the stairs. Make sure that there are handrails on both sides of the stairs and use  them. Fix handrails that are broken or loose. Make sure that handrails are as long as the stairways. Check any carpeting to make sure that it is firmly attached to the stairs. Fix any carpet that is loose or worn. Avoid having throw rugs at the top or bottom of the stairs. If you do have throw rugs, attach them to the floor with carpet tape. Make sure that you have a light switch at the top of the stairs and the bottom of the stairs. If you do not have them, ask someone to add them for you. What else can I do to help prevent falls? Wear shoes that: Do not have high heels. Have rubber bottoms. Are comfortable and fit you well. Are closed at the toe. Do not wear sandals. If you use a stepladder: Make sure that it is fully opened.  Do not climb a closed stepladder. Make sure that both sides of the stepladder are locked into place. Ask someone to hold it for you, if possible. Clearly mark and make sure that you can see: Any grab bars or handrails. First and last steps. Where the edge of each step is. Use tools that help you move around (mobility aids) if they are needed. These include: Canes. Walkers. Scooters. Crutches. Turn on the lights when you go into a dark area. Replace any light bulbs as soon as they burn out. Set up your furniture so you have a clear path. Avoid moving your furniture around. If any of your floors are uneven, fix them. If there are any pets around you, be aware of where they are. Review your medicines with your doctor. Some medicines can make you feel dizzy. This can increase your chance of falling. Ask your doctor what other things that you can do to help prevent falls. This information is not intended to replace advice given to you by your health care provider. Make sure you discuss any questions you have with your health care provider. Document Released: 07/25/2009 Document Revised: 03/05/2016 Document Reviewed: 11/02/2014 Elsevier Interactive Patient Education  2017 [employer].

## 2021-12-26 ENCOUNTER — Ambulatory Visit (INDEPENDENT_AMBULATORY_CARE_PROVIDER_SITE_OTHER): Payer: PPO | Admitting: *Deleted

## 2021-12-26 DIAGNOSIS — I10 Essential (primary) hypertension: Secondary | ICD-10-CM

## 2021-12-26 DIAGNOSIS — I2510 Atherosclerotic heart disease of native coronary artery without angina pectoris: Secondary | ICD-10-CM

## 2021-12-26 DIAGNOSIS — E1165 Type 2 diabetes mellitus with hyperglycemia: Secondary | ICD-10-CM

## 2021-12-26 NOTE — Patient Instructions (Signed)
Visit Information ? ?Thank you for taking time to visit with me today. Please don't hesitate to contact me if I can be of assistance to you before our next scheduled telephone appointment. ? ?Following are the goals we discussed today:  ?Take medications as prescribed   ?Attend all scheduled provider appointments ?Perform all self care activities independently  ?Call provider office for new concerns or questions  ?check blood sugar at prescribed times: once daily ?check feet daily for cuts, sores or redness ?enter blood sugar readings and medication or insulin into daily log ?take the blood sugar log to all doctor visits ?take the blood sugar meter to all doctor visits ?limit fast food meals to no more than 1 per week ?prepare main meal at home 3 to 5 days each week ?wash and dry feet carefully every day ?check blood pressure weekly ?choose a place to take my blood pressure (home, clinic or office, retail store) ?write blood pressure results in a log or diary ?keep a blood pressure log ?take blood pressure log to all doctor appointments ?keep all doctor appointments ?take medications for blood pressure exactly as prescribed ?Follow low sodium diet, limit fast food and read food labels ?Be mindful of carbohydrate intake at each meal- too much bread, pasta, rice, potatoes, etc will elevate blood sugar ? ?Our next appointment is by telephone on 03/13/22 at 9 am ? ?Please call the care guide team at [phone] if you need to cancel or reschedule your appointment.  ? ?If you are experiencing a Mental Health or [place] or need someone to talk to, please call the Suicide and Crisis Lifeline: 988 ?call the [place] National Suicide Prevention Lifeline: 7126507011 or TTY: 808-558-8407 TTY 931-341-2894) to talk to a trained counselor ?call 1-800-273-TALK (toll free, 24 hour hotline) ?go to [facility] [address] [place] [phone]) ?call 911  ? ?Patient  verbalizes understanding of instructions and care plan provided today and agrees to view in [facility]. Active MyChart status confirmed with patient.   ? ?Jacqlyn Larsen RNC, BSN ?RN Case Manager ?[place] ?[phone] ? ?

## 2021-12-26 NOTE — Chronic Care Management (AMB) (Signed)
?Chronic Care Management  ? ?CCM RN Visit Note ? ?12/26/2021 ?Name: [name] MRN: [id number] DOB: 1930/10/16 ? ?Subjective: ?[name] is a 88 y.o. year old male who is a primary care patient of [name], [name], MD. The care management team was consulted for assistance with disease management and care coordination needs.   ? ?Engaged with patient by telephone for follow up visit in response to provider referral for case management and/or care coordination services.  ? ?Consent to Services:  ?The patient was given information about Chronic Care Management services, agreed to services, and gave verbal consent prior to initiation of services.  Please see initial visit note for detailed documentation.  ? ?Patient agreed to services and verbal consent obtained.  ? ?Assessment: Review of patient past medical history, allergies, medications, health status, including review of consultants reports, laboratory and other test data, was performed as part of comprehensive evaluation and provision of chronic care management services.  ? ?SDOH (Social Determinants of Health) assessments and interventions performed:   ? ?CCM Care Plan ? ?Allergies  ?Allergen Reactions  ? Tape Other (See Comments)  ?  PLEASE USE EASY-RELEASE TAPE!!!! THE SKIN IS VERY SENSITIVE!!  ? ? ?Outpatient Encounter Medications as of 12/26/2021  ?Medication Sig  ? albuterol (VENTOLIN HFA) 108 (90 Base) MCG/ACT inhaler Inhale 2 puffs into the lungs every 6 (six) hours as needed for wheezing or shortness of breath.  ? ASPIRIN LOW DOSE 81 MG EC tablet TAKE 1 TABLET (81 MG TOTAL) BY MOUTH DAILY. SWALLOW WHOLE.  ? atenolol (TENORMIN) 50 MG tablet TAKE 1 TABLET BY MOUTH EVERY DAY  ? bisacodyl (DULCOLAX) 10 MG suppository Place 1 suppository (10 mg total) rectally as needed for moderate constipation.  ? furosemide (LASIX) 20 MG tablet TAKE 1 TABLET BY MOUTH EVERY DAY  ? glipiZIDE (GLUCOTROL XL) 10 MG 24 hr tablet TAKE 1 TABLET BY MOUTH EVERY DAY  WITH BREAKFAST  ? Glucose Blood (BLOOD GLUCOSE TEST STRIPS) STRP Please dispense as One touch Ultra Blue. Use as directed to monitor FSBS 3x daily. Dx: E11.9.  ? LINZESS 145 MCG CAPS capsule TAKE 1 CAPSULE BY MOUTH DAILY BEFORE BREAKFAST.  ? pioglitazone (ACTOS) 30 MG tablet TAKE 1 TABLET BY MOUTH EVERY DAY  ? TRULICITY 2.95 JO/8.4ZY SOPN INJECT 0.75 MG INTO THE SKIN ONCE A WEEK.  ? ?No facility-administered encounter medications on file as of 12/26/2021.  ? ? ?Patient Active Problem List  ? Diagnosis Date Noted  ? Constipation 10/09/2020  ? Acute respiratory failure due to COVID-19 [facility]) 09/05/2020  ? AKI (acute kidney injury) ([place]) 09/05/2020  ? Hard of hearing 09/05/2020  ? Infection of skin of neck 06/26/2020  ? Aftercare following surgery of the circulatory system, [place] 05/15/2014  ? Carotid artery occlusion 02/23/2012  ? HTN (hypertension) 01/13/2011  ? Diabetes mellitus type II, uncontrolled 01/13/2011  ? BPH (benign prostatic hyperplasia) 01/13/2011  ? ASCVD (arteriosclerotic cardiovascular disease) 01/13/2011  ? History of CEA (carotid endarterectomy) 01/13/2011  ? 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 ? ?Conditions to be addressed/monitored:HTN and DMII ? ?Care Plan : RN Care Manager Plan of Care  ?Updates made by Kassie Mends, RN since 12/26/2021 12:00 AM  ?  ? ?Problem: No plan of care established for management of chronic disease states  (DM2, ASCVD with HTN)   ?Priority: High  ?  ? ?Long-Range Goal: Development of plan of care for chronic disease management  (DM2, ASCVD with HTN)   ?Start Date:  10/31/2021  ?Expected End Date: 04/29/2022  ?Priority: High  ?Note:   ?Current Barriers:  ?Knowledge Deficits related to plan of care for management of CAD, HTN, and DMII  ?Spoke with patient and spouse Sanjay Broadfoot (permission given by pt) as patient is hard of hearing, spouse reports pt is overall independent with his care, still drives around town and close to home, gets outside and walks. ?Patient/  spouse reports usually checks CBG once daily but has not checked this week due to patient's adult daughter is out of state, will resume checking CBG when she gets back, reports "blood sugar is always about the same", does not eat a special diet, tries to avoid fast food, does drink sweet tea and sodas at times. ?Blood pressure checked on occasion by adult daughter (she is a Marine scientist)- spouse is unsure of the readings ? ?RNCM Clinical Goal(s):  ?Patient will verbalize understanding of plan for management of CAD, HTN, and DMII as evidenced by patient report, review of EHR and  through collaboration with RN Care manager, provider, and care team.  ? ?Interventions: ?1:1 collaboration with primary care provider regarding development and update of comprehensive plan of care as evidenced by provider attestation and co-signature ?Inter-disciplinary care team collaboration (see longitudinal plan of care) ?Evaluation of current treatment plan related to  self management and patient's adherence to plan as established by provider ? ? ?CAD Interventions: (Status:  New goal. and Goal on track:  Yes.) Long Term Goal ?Assessed understanding of CAD diagnosis ?Medications reviewed including medications utilized in CAD treatment plan ?Reviewed Importance of taking all medications as prescribed ?Reinforced importance of adherence to heart healthy diet and eating lean meats, fresh/ frozen vegetables if possible ? ?Diabetes Interventions:  (Status:  New goal. and Goal on track:  Yes.) Long Term Goal ?Assessed patient's understanding of A1c goal: <7% ?Reviewed medications with patient and discussed importance of medication adherence ?Review of patient status, including review of consultants reports, relevant laboratory and other test results, and medications completed ?Reinforced carbohydrate modified diet ?Reinforced importance of staying active ?Lab Results  ?Component Value Date  ? HGBA1C 10.6 (H) 06/04/2020  ? ?Hypertension  Interventions:  (Status:  New goal. and Goal on track:  Yes.) Long Term Goal ?Last practice recorded BP readings:  ?BP Readings from Last 3 Encounters:  ?08/26/21 (!) 170/83  ?04/22/21 (!) 177/99  ?04/08/21 135/78  ?Most recent eGFR/CrCl: No results found for: EGFR  No components found for: CRCL ? ?Evaluation of current treatment plan related to hypertension self management and patient's adherence to plan as established by provider ?Reviewed medications with patient and discussed importance of compliance ?Advised patient, providing education and rationale, to monitor blood pressure daily and record, calling PCP for findings outside established parameters ?Discussed complications of poorly controlled blood pressure such as heart disease, stroke, circulatory complications, vision complications, kidney impairment, sexual dysfunction ?Reinforced importance of following low sodium diet, limiting/ avoiding salty snacks, foods high in sodium content  ? ?Patient Goals/Self-Care Activities: ?Take medications as prescribed   ?Attend all scheduled provider appointments ?Perform all self care activities independently  ?Call provider office for new concerns or questions  ?check blood sugar at prescribed times: once daily ?check feet daily for cuts, sores or redness ?enter blood sugar readings and medication or insulin into daily log ?take the blood sugar log to all doctor visits ?take the blood sugar meter to all doctor visits ?limit fast food meals to no more than 1 per week ?prepare main meal at  home 3 to 5 days each week ?wash and dry feet carefully every day ?check blood pressure weekly ?choose a place to take my blood pressure (home, clinic or office, retail store) ?write blood pressure results in a log or diary ?keep a blood pressure log ?take blood pressure log to all doctor appointments ?keep all doctor appointments ?take medications for blood pressure exactly as prescribed ?Follow low sodium diet, limit fast food and  read food labels ?Be mindful of carbohydrate intake at each meal- too much bread, pasta, rice, potatoes, etc will elevate blood sugar ? ? ?  ? ? ?Plan:Telephone follow up appointment with care management team member sch

## 2022-01-09 DIAGNOSIS — I10 Essential (primary) hypertension: Secondary | ICD-10-CM

## 2022-01-09 DIAGNOSIS — E1165 Type 2 diabetes mellitus with hyperglycemia: Secondary | ICD-10-CM

## 2022-01-09 DIAGNOSIS — I2510 Atherosclerotic heart disease of native coronary artery without angina pectoris: Secondary | ICD-10-CM

## 2022-03-13 ENCOUNTER — Ambulatory Visit (INDEPENDENT_AMBULATORY_CARE_PROVIDER_SITE_OTHER): Payer: PPO | Admitting: *Deleted

## 2022-03-13 DIAGNOSIS — I2510 Atherosclerotic heart disease of native coronary artery without angina pectoris: Secondary | ICD-10-CM

## 2022-03-13 DIAGNOSIS — E1165 Type 2 diabetes mellitus with hyperglycemia: Secondary | ICD-10-CM

## 2022-03-13 DIAGNOSIS — I10 Essential (primary) hypertension: Secondary | ICD-10-CM

## 2022-03-13 NOTE — Chronic Care Management (AMB) (Signed)
Chronic Care Management   CCM RN Visit Note  03/13/2022 Name: [name] MRN: [id number] DOB: 1931-09-08  Subjective: [name] is a 88 y.o. year old male who is a primary care patient of [name], [name], MD. The care management team was consulted for assistance with disease management and care coordination needs.    Engaged with patient by telephone for follow up visit in response to provider referral for case management and/or care coordination services.   Consent to Services:  The patient was given information about Chronic Care Management services, agreed to services, and gave verbal consent prior to initiation of services.  Please see initial visit note for detailed documentation.   Patient agreed to services and verbal consent obtained.   Assessment: Review of patient past medical history, allergies, medications, health status, including review of consultants reports, laboratory and other test data, was performed as part of comprehensive evaluation and provision of chronic care management services.   SDOH (Social Determinants of Health) assessments and interventions performed:    CCM Care Plan  Allergies  Allergen Reactions   Tape Other (See Comments)    PLEASE USE EASY-RELEASE TAPE!!!! THE SKIN IS VERY SENSITIVE!!    Outpatient Encounter Medications as of 03/13/2022  Medication Sig   albuterol (VENTOLIN HFA) 108 (90 Base) MCG/ACT inhaler Inhale 2 puffs into the lungs every 6 (six) hours as needed for wheezing or shortness of breath.   ASPIRIN LOW DOSE 81 MG EC tablet TAKE 1 TABLET (81 MG TOTAL) BY MOUTH DAILY. SWALLOW WHOLE.   atenolol (TENORMIN) 50 MG tablet TAKE 1 TABLET BY MOUTH EVERY DAY   bisacodyl (DULCOLAX) 10 MG suppository Place 1 suppository (10 mg total) rectally as needed for moderate constipation.   furosemide (LASIX) 20 MG tablet TAKE 1 TABLET BY MOUTH EVERY DAY   glipiZIDE (GLUCOTROL XL) 10 MG 24 hr tablet TAKE 1 TABLET BY MOUTH EVERY DAY  WITH BREAKFAST   Glucose Blood (BLOOD GLUCOSE TEST STRIPS) STRP Please dispense as One touch Ultra Blue. Use as directed to monitor FSBS 3x daily. Dx: E11.9.   LINZESS 145 MCG CAPS capsule TAKE 1 CAPSULE BY MOUTH DAILY BEFORE BREAKFAST.   pioglitazone (ACTOS) 30 MG tablet TAKE 1 TABLET BY MOUTH EVERY DAY   TRULICITY 3.89 HT/3.4KA SOPN INJECT 0.75 MG INTO THE SKIN ONCE A WEEK.   No facility-administered encounter medications on file as of 03/13/2022.    Patient Active Problem List   Diagnosis Date Noted   Constipation 10/09/2020   Acute respiratory failure due to COVID-19 [facility]) 09/05/2020   AKI (acute kidney injury) ([place]) 09/05/2020   Hard of hearing 09/05/2020   Infection of skin of neck 06/26/2020   Aftercare following surgery of the circulatory system, [place] 05/15/2014   Carotid artery occlusion 02/23/2012   HTN (hypertension) 01/13/2011   Diabetes mellitus type II, uncontrolled 01/13/2011   BPH (benign prostatic hyperplasia) 01/13/2011   ASCVD (arteriosclerotic cardiovascular disease) 01/13/2011   History of CEA (carotid endarterectomy) 01/13/2011   Tobacco abuse, 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    Conditions to be addressed/monitored:CAD, HTN, and DMII  Care Plan : RN Care Manager Plan of Care  Updates made by Kassie Mends, RN since 03/13/2022 12:00 AM     Problem: No plan of care established for management of chronic disease states  (DM2, ASCVD with HTN)   Priority: High     Long-Range Goal: Development of plan of care for chronic disease management  (DM2, ASCVD with HTN)   Start  Date: 10/31/2021  Expected End Date: 09/09/2022  Priority: High  Note:   Current Barriers:  Knowledge Deficits related to plan of care for management of CAD, HTN, and DMII  Spoke with patient and spouse Amar Sippel (permission given by pt) as patient is hard of hearing, spouse reports pt is overall independent with his care, still drives around town and close to home, gets outside and walks. Patient/  spouse reports usually checks CBG once daily but has not checked recently (patient's daughter is a Marine scientist and checks CBG) will resume checking CBG, reports "blood sugar is always about the same", does not eat a special diet, tries to avoid fast food, does drink sweet tea and sodas at times. Blood pressure checked on occasion by adult daughter (she is a Marine scientist)- spouse is unsure of the readings Patient reports he is feeling well, appetite is good, no new changes to report.  RNCM Clinical Goal(s):  Patient will verbalize understanding of plan for management of CAD, HTN, and DMII as evidenced by patient report, review of EHR and  through collaboration with RN Care manager, provider, and care team.   Interventions: 1:1 collaboration with primary care provider regarding development and update of comprehensive plan of care as evidenced by provider attestation and co-signature Inter-disciplinary care team collaboration (see longitudinal plan of care) Evaluation of current treatment plan related to  self management and patient's adherence to plan as established by provider   CAD Interventions: (Status:  New goal. and Goal on track:  Yes.) Long Term Goal Assessed understanding of CAD diagnosis Medications reviewed including medications utilized in CAD treatment plan Reviewed Importance of taking all medications as prescribed Reviewed importance of adherence to heart healthy diet and eating lean meats, fresh/ frozen vegetables if possible Reviewed importance of exercise even if only few minutes per day  Diabetes Interventions:  (Status:  New goal. and Goal on track:  Yes.) Long Term Goal Assessed patient's understanding of A1c goal: <7% Reviewed medications with patient and discussed importance of medication adherence Review of patient status, including review of consultants reports, relevant laboratory and other test results, and medications completed Reviewed carbohydrate modified diet Reinforced  importance of staying active Reviewed importance of having daughter check CBG at least once daily and record Lab Results  Component Value Date   HGBA1C 10.6 (H) 06/04/2020   Hypertension Interventions:  (Status:  New goal. and Goal on track:  Yes.) Long Term Goal Last practice recorded BP readings:  BP Readings from Last 3 Encounters:  08/26/21 (!) 170/83  04/22/21 (!) 177/99  04/08/21 135/78  Most recent eGFR/CrCl: No results found for: EGFR  No components found for: CRCL  Evaluation of current treatment plan related to hypertension self management and patient's adherence to plan as established by provider Reviewed medications with patient and discussed importance of compliance Advised patient, providing education and rationale, to monitor blood pressure daily and record, calling PCP for findings outside established parameters Discussed complications of poorly controlled blood pressure such as heart disease, stroke, circulatory complications, vision complications, kidney impairment, sexual dysfunction Reviewed importance of following low sodium diet, limiting/ avoiding salty snacks, foods high in sodium content   Patient Goals/Self-Care Activities: Take medications as prescribed   Attend all scheduled provider appointments Perform all self care activities independently  Call provider office for new concerns or questions  check blood sugar at prescribed times: once daily check feet daily for cuts, sores or redness enter blood sugar readings and medication or insulin into daily log  take the blood sugar log to all doctor visits take the blood sugar meter to all doctor visits trim toenails straight across wash and dry feet carefully every day wear comfortable, cotton socks wear comfortable, well-fitting shoes check blood pressure weekly choose a place to take my blood pressure (home, clinic or office, retail store) write blood pressure results in a log or diary keep a blood pressure  log take blood pressure log to all doctor appointments keep all doctor appointments take medications for blood pressure exactly as prescribed eat more whole grains, fruits and vegetables, lean meats and healthy fats Continue to follow low sodium diet, limit fast food and read food labels Be mindful of carbohydrate intake at each meal- too much bread, pasta, rice, potatoes, etc will elevate blood sugar Try to get outdoors daily and walk, even if only for a few minutes Talk with your daughter about checking your blood sugar daily and recording       Plan:Telephone follow up appointment with care management team member scheduled for:  06/01/22  Jacqlyn Larsen [facility], BSN RN Case Manager [facility] [phone]

## 2022-03-13 NOTE — Patient Instructions (Signed)
Visit Information  Thank you for taking time to visit with me today. Please don't hesitate to contact me if I can be of assistance to you before our next scheduled telephone appointment.  Following are the goals we discussed today:  Take medications as prescribed   Attend all scheduled provider appointments Perform all self care activities independently  Call provider office for new concerns or questions  check blood sugar at prescribed times: once daily check feet daily for cuts, sores or redness enter blood sugar readings and medication or insulin into daily log take the blood sugar log to all doctor visits take the blood sugar meter to all doctor visits trim toenails straight across wash and dry feet carefully every day wear comfortable, cotton socks wear comfortable, well-fitting shoes check blood pressure weekly choose a place to take my blood pressure (home, clinic or office, retail store) write blood pressure results in a log or diary keep a blood pressure log take blood pressure log to all doctor appointments keep all doctor appointments take medications for blood pressure exactly as prescribed eat more whole grains, fruits and vegetables, lean meats and healthy fats Continue to follow low sodium diet, limit fast food and read food labels Be mindful of carbohydrate intake at each meal- too much bread, pasta, rice, potatoes, etc will elevate blood sugar Try to get outdoors daily and walk, even if only for a few minutes Talk with your daughter about checking your blood sugar daily and recording  Our next appointment is by telephone on 06/01/22 at 9 am  Please call the care guide team at [phone] if you need to cancel or reschedule your appointment.   If you are experiencing a Mental Health or [place] or need someone to talk to, please call the Suicide and Crisis Lifeline: 988 call the [place] National Suicide Prevention Lifeline: (252) 630-0731 or TTY:  725-763-3556 TTY 630-201-6310) to talk to a trained counselor call 1-800-273-TALK (toll free, 24 hour hotline) go to [facility] [address], [place] [phone]) call 911   Patient verbalizes understanding of instructions and care plan provided today and agrees to view in [facility]. Active MyChart status and patient understanding of how to access instructions and care plan via MyChart confirmed with patient.     Jacqlyn Larsen RNC, BSN RN Case Manager [facility] [phone]

## 2022-03-18 ENCOUNTER — Other Ambulatory Visit: Payer: Self-pay | Admitting: Family Medicine

## 2022-03-18 NOTE — Telephone Encounter (Signed)
Requested medication (s) are due for refill today: yes  Requested medication (s) are on the active medication list: yes  Last refill:  02/19/21  Future visit scheduled: no  Notes to clinic:  Unable to refill per protocol, appointment needed.      Requested Prescriptions  Pending Prescriptions Disp Refills   TRULICITY 7.37 TG/6.2IR SOPN [Pharmacy Med Name: TRULICITY 4.85 IO/2.7 ML PEN]  11    Sig: INJECT 0.75 MG INTO THE SKIN ONCE A WEEK.     Endocrinology:  Diabetes - GLP-1 Receptor Agonists Failed - 03/18/2022  3:36 AM      Failed - HBA1C is between 0 and 7.9 and within 180 days    Hgb A1c MFr Bld  Date Value Ref Range Status  06/04/2020 10.6 (H) <5.7 % of total Hgb Final    Comment:    For someone without known diabetes, a hemoglobin A1c value of 6.5% or greater indicates that they may have  diabetes and this should be confirmed with a follow-up  test. . For someone with known diabetes, a value <7% indicates  that their diabetes is well controlled and a value  greater than or equal to 7% indicates suboptimal  control. A1c targets should be individualized based on  duration of diabetes, age, comorbid conditions, and  other considerations. . Currently, no consensus exists regarding use of hemoglobin A1c for diagnosis of diabetes for children. .          Failed - Valid encounter within last 6 months    Recent Outpatient Visits           1 year ago Blurry vision, bilateral   [place] [name], [name], MD   1 year ago Constipation, unspecified constipation type   [place] Eulogio Bear, NP   1 year ago Uncontrolled type 2 diabetes mellitus with hyperosmolarity without coma, with long-term current use of insulin ([place])   [place] Susy Frizzle, MD   1 year ago Type 2 diabetes mellitus with hyperglycemia, without long-term current use of insulin ([place])   [place] Susy Frizzle, MD    2 years ago Uncontrolled type 2 diabetes mellitus with hyperosmolarity without coma, with long-term current use of insulin ([place])   [facility] [name], [name], MD

## 2022-03-19 NOTE — Telephone Encounter (Signed)
Medication was refilled 03/18/22 by PCP. This is a duplicate request will refuse this request.  Requested Prescriptions  Pending Prescriptions Disp Refills  . TRULICITY 7.90 WI/0.9BD SOPN [Pharmacy Med Name: TRULICITY 5.32 DJ/2.4 ML PEN]  11    Sig: INJECT 0.75 MG INTO THE SKIN ONCE A WEEK.     Endocrinology:  Diabetes - GLP-1 Receptor Agonists Failed - 03/18/2022  5:59 PM      Failed - HBA1C is between 0 and 7.9 and within 180 days    Hgb A1c MFr Bld  Date Value Ref Range Status  06/04/2020 10.6 (H) <5.7 % of total Hgb Final    Comment:    For someone without known diabetes, a hemoglobin A1c value of 6.5% or greater indicates that they may have  diabetes and this should be confirmed with a follow-up  test. . For someone with known diabetes, a value <7% indicates  that their diabetes is well controlled and a value  greater than or equal to 7% indicates suboptimal  control. A1c targets should be individualized based on  duration of diabetes, age, comorbid conditions, and  other considerations. . Currently, no consensus exists regarding use of hemoglobin A1c for diagnosis of diabetes for children. .          Failed - Valid encounter within last 6 months    Recent Outpatient Visits          1 year ago Blurry vision, bilateral   [place] [name], [name], MD   1 year ago Constipation, unspecified constipation type   [place] Eulogio Bear, NP   1 year ago Uncontrolled type 2 diabetes mellitus with hyperosmolarity without coma, with long-term current use of insulin ([place])   [place] Susy Frizzle, MD   1 year ago Type 2 diabetes mellitus with hyperglycemia, without long-term current use of insulin ([place])   [place] Susy Frizzle, MD   2 years ago Uncontrolled type 2 diabetes mellitus with hyperosmolarity without coma, with long-term current use of insulin ([place])   [facility]  Medicine [name], [name], MD

## 2022-03-25 ENCOUNTER — Encounter: Payer: Self-pay | Admitting: Family Medicine

## 2022-03-25 ENCOUNTER — Other Ambulatory Visit: Payer: Self-pay | Admitting: Physician Assistant

## 2022-03-29 ENCOUNTER — Other Ambulatory Visit: Payer: Self-pay | Admitting: Family Medicine

## 2022-03-30 ENCOUNTER — Other Ambulatory Visit: Payer: Self-pay | Admitting: Family Medicine

## 2022-03-30 NOTE — Telephone Encounter (Signed)
Called [phone] and reports # no longer in service.  Calling to schedule appt

## 2022-03-30 NOTE — Telephone Encounter (Signed)
Requested medication (s) are due for refill today: yes   Requested medication (s) are on the active medication list: yes   Last refill:  03/18/22 #2 ml 0 refills  Future visit scheduled: yes in 1 week  Notes to clinic:  patient's daughter reports completely out of medication. Called to scheduled CPE for 04/09/22. Please advise if appt change needed. Can patient continue to get refill on Rx?     Requested Prescriptions  Pending Prescriptions Disp Refills   TRULICITY 1.61 WR/6.0AV SOPN [Pharmacy Med Name: TRULICITY 4.09 WJ/1.9 ML PEN]  11    Sig: INJECT 0.75 MG INTO THE SKIN ONCE A WEEK.     Endocrinology:  Diabetes - GLP-1 Receptor Agonists Failed - 03/30/2022  3:02 PM      Failed - HBA1C is between 0 and 7.9 and within 180 days    Hgb A1c MFr Bld  Date Value Ref Range Status  06/04/2020 10.6 (H) <5.7 % of total Hgb Final    Comment:    For someone without known diabetes, a hemoglobin A1c value of 6.5% or greater indicates that they may have  diabetes and this should be confirmed with a follow-up  test. . For someone with known diabetes, a value <7% indicates  that their diabetes is well controlled and a value  greater than or equal to 7% indicates suboptimal  control. A1c targets should be individualized based on  duration of diabetes, age, comorbid conditions, and  other considerations. . Currently, no consensus exists regarding use of hemoglobin A1c for diagnosis of diabetes for children. .          Failed - Valid encounter within last 6 months    Recent Outpatient Visits           1 year ago Blurry vision, bilateral   [place] [name], [name], MD   1 year ago Constipation, unspecified constipation type   [place] Eulogio Bear, NP   1 year ago Uncontrolled type 2 diabetes mellitus with hyperosmolarity without coma, with long-term current use of insulin ([place])   [place] Susy Frizzle, MD   1 year  ago Type 2 diabetes mellitus with hyperglycemia, without long-term current use of insulin ([place])   [place] Susy Frizzle, MD   2 years ago Uncontrolled type 2 diabetes mellitus with hyperosmolarity without coma, with long-term current use of insulin ([place])   [place] [name], [name], MD       Future Appointments             In 1 week [name], [name], MD [place]

## 2022-03-31 MED ORDER — TRULICITY 0.75 MG/0.5ML ~~LOC~~ SOAJ: 0.7500 mg | SUBCUTANEOUS | 0 refills | Status: DC

## 2022-03-31 NOTE — Telephone Encounter (Signed)
Courtesy refill. appt scheduled in 1 week. Patient out of medication per daughter.  Requested Prescriptions  Pending Prescriptions Disp Refills  . Dulaglutide (TRULICITY) 3.88 IL/5.7VJ SOPN 2 mL 0    Sig: Inject 0.75 mg into the skin once a week.     Endocrinology:  Diabetes - GLP-1 Receptor Agonists Failed - 03/31/2022 10:14 AM      Failed - HBA1C is between 0 and 7.9 and within 180 days    Hgb A1c MFr Bld  Date Value Ref Range Status  06/04/2020 10.6 (H) <5.7 % of total Hgb Final    Comment:    For someone without known diabetes, a hemoglobin A1c value of 6.5% or greater indicates that they may have  diabetes and this should be confirmed with a follow-up  test. . For someone with known diabetes, a value <7% indicates  that their diabetes is well controlled and a value  greater than or equal to 7% indicates suboptimal  control. A1c targets should be individualized based on  duration of diabetes, age, comorbid conditions, and  other considerations. . Currently, no consensus exists regarding use of hemoglobin A1c for diagnosis of diabetes for children. .          Failed - Valid encounter within last 6 months    Recent Outpatient Visits          1 year ago Blurry vision, bilateral   [place] [name], [name], MD   1 year ago Constipation, unspecified constipation type   [place] Eulogio Bear, NP   1 year ago Uncontrolled type 2 diabetes mellitus with hyperosmolarity without coma, with long-term current use of insulin ([place])   [place] Susy Frizzle, MD   1 year ago Type 2 diabetes mellitus with hyperglycemia, without long-term current use of insulin ([place])   [place] Susy Frizzle, MD   2 years ago Uncontrolled type 2 diabetes mellitus with hyperosmolarity without coma, with long-term current use of insulin ([place])   [place] [name], [name], MD      Future  Appointments            In 1 week [name], [name], MD [place]

## 2022-04-09 ENCOUNTER — Ambulatory Visit (INDEPENDENT_AMBULATORY_CARE_PROVIDER_SITE_OTHER): Payer: PPO | Admitting: Family Medicine

## 2022-04-09 ENCOUNTER — Encounter: Payer: Self-pay | Admitting: Family Medicine

## 2022-04-09 VITALS — BP 152/82 | HR 77 | Temp 97.8°F | Ht 64.0 in | Wt 182.0 lb

## 2022-04-09 DIAGNOSIS — I6522 Occlusion and stenosis of left carotid artery: Secondary | ICD-10-CM | POA: Diagnosis not present

## 2022-04-09 DIAGNOSIS — I10 Essential (primary) hypertension: Secondary | ICD-10-CM

## 2022-04-09 DIAGNOSIS — Z794 Long term (current) use of insulin: Secondary | ICD-10-CM | POA: Diagnosis not present

## 2022-04-09 DIAGNOSIS — I2510 Atherosclerotic heart disease of native coronary artery without angina pectoris: Secondary | ICD-10-CM | POA: Diagnosis not present

## 2022-04-09 DIAGNOSIS — N1832 Chronic kidney disease, stage 3b: Secondary | ICD-10-CM | POA: Diagnosis not present

## 2022-04-09 DIAGNOSIS — E1100 Type 2 diabetes mellitus with hyperosmolarity without nonketotic hyperglycemic-hyperosmolar coma (NKHHC): Secondary | ICD-10-CM

## 2022-04-09 MED ORDER — LOSARTAN POTASSIUM 25 MG PO TABS: 25.0000 mg | ORAL_TABLET | Freq: Every day | ORAL | 3 refills | Status: DC
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
## 2022-04-10 DIAGNOSIS — I2510 Atherosclerotic heart disease of native coronary artery without angina pectoris: Secondary | ICD-10-CM | POA: Diagnosis not present

## 2022-04-10 DIAGNOSIS — E1159 Type 2 diabetes mellitus with other circulatory complications: Secondary | ICD-10-CM

## 2022-04-10 DIAGNOSIS — Z7984 Long term (current) use of oral hypoglycemic drugs: Secondary | ICD-10-CM | POA: Diagnosis not present

## 2022-04-10 DIAGNOSIS — I10 Essential (primary) hypertension: Secondary | ICD-10-CM

## 2022-04-10 DIAGNOSIS — Z7985 Long-term (current) use of injectable non-insulin antidiabetic drugs: Secondary | ICD-10-CM

## 2022-04-10 LAB — HEMOGLOBIN A1C
Hgb A1c MFr Bld: 8.7 % of total Hgb — ABNORMAL HIGH (ref <5.7)
Mean Plasma Glucose: 203 mg/dL
eAG (mmol/L): 11.2 mmol/L

## 2022-04-10 LAB — CBC WITH DIFFERENTIAL/PLATELET
Absolute Monocytes: 775 cells/uL (ref 200–950)
Basophils Absolute: 27 cells/uL (ref 0–200)
Basophils Relative: 0.4 %
Eosinophils Absolute: 190 cells/uL (ref 15–500)
Eosinophils Relative: 2.8 %
HCT: 46 % (ref 38.5–50.0)
Hemoglobin: 15 g/dL (ref 13.2–17.1)
Lymphs Abs: 2217 cells/uL (ref 850–3900)
MCH: 31.1 pg (ref 27.0–33.0)
MCHC: 32.6 g/dL (ref 32.0–36.0)
MCV: 95.2 fL (ref 80.0–100.0)
MPV: 9.9 fL (ref 7.5–12.5)
Monocytes Relative: 11.4 %
Neutro Abs: 3590 cells/uL (ref 1500–7800)
Neutrophils Relative %: 52.8 %
Platelets: 167 10*3/uL (ref 140–400)
RBC: 4.83 10*6/uL (ref 4.20–5.80)
RDW: 12.5 % (ref 11.0–15.0)
Total Lymphocyte: 32.6 %
WBC: 6.8 10*3/uL (ref 3.8–10.8)

## 2022-04-10 LAB — COMPLETE METABOLIC PANEL WITH GFR
AG Ratio: 1.7 (calc) (ref 1.0–2.5)
ALT: 12 U/L (ref 9–46)
AST: 12 U/L (ref 10–35)
Albumin: 4.2 g/dL (ref 3.6–5.1)
Alkaline phosphatase (APISO): 97 U/L (ref 35–144)
BUN/Creatinine Ratio: 16 (calc) (ref 6–22)
BUN: 25 mg/dL (ref 7–25)
CO2: 28 mmol/L (ref 20–32)
Calcium: 9.4 mg/dL (ref 8.6–10.3)
Chloride: 101 mmol/L (ref 98–110)
Creat: 1.52 mg/dL — ABNORMAL HIGH (ref 0.70–1.22)
Globulin: 2.5 g/dL (calc) (ref 1.9–3.7)
Glucose, Bld: 203 mg/dL — ABNORMAL HIGH (ref 65–99)
Potassium: 4.7 mmol/L (ref 3.5–5.3)
Sodium: 136 mmol/L (ref 135–146)
Total Bilirubin: 0.9 mg/dL (ref 0.2–1.2)
Total Protein: 6.7 g/dL (ref 6.1–8.1)
eGFR: 44 mL/min/{1.73_m2} — ABNORMAL LOW (ref >=60)

## 2022-04-10 LAB — LIPID PANEL
Cholesterol: 176 mg/dL (ref <200)
HDL: 41 mg/dL (ref >=40)
LDL Cholesterol (Calc): 107 mg/dL (calc) — ABNORMAL HIGH
Non-HDL Cholesterol (Calc): 135 mg/dL (calc) — ABNORMAL HIGH (ref <130)
Total CHOL/HDL Ratio: 4.3 (calc) (ref <5.0)
Triglycerides: 168 mg/dL — ABNORMAL HIGH (ref <150)

## 2022-04-15 ENCOUNTER — Other Ambulatory Visit: Payer: Self-pay

## 2022-04-15 ENCOUNTER — Encounter: Payer: Self-pay | Admitting: Family Medicine

## 2022-04-15 MED ORDER — DAPAGLIFLOZIN PROPANEDIOL 10 MG PO TABS: 10.0000 mg | ORAL_TABLET | Freq: Every day | ORAL | 0 refills | Status: DC

## 2022-04-22 ENCOUNTER — Other Ambulatory Visit: Payer: Self-pay | Admitting: Family Medicine

## 2022-04-22 NOTE — Telephone Encounter (Signed)
Courtesy refill. Pt has appt 06/01/22 Requested Prescriptions  Pending Prescriptions Disp Refills  . TRULICITY 1.97 JO/8.3GP SOPN [Pharmacy Med Name: TRULICITY 4.98 YM/4.1 ML PEN] 2 mL 0    Sig: INJECT 0.75 MG SUBCUTANEOUSLY ONE TIME PER WEEK     Endocrinology:  Diabetes - GLP-1 Receptor Agonists Failed - 04/22/2022 11:37 AM      Failed - HBA1C is between 0 and 7.9 and within 180 days    Hgb A1c MFr Bld  Date Value Ref Range Status  04/09/2022 8.7 (H) <5.7 % of total Hgb Final    Comment:    For someone without known diabetes, a hemoglobin A1c value of 6.5% or greater indicates that they may have  diabetes and this should be confirmed with a follow-up  test. . For someone with known diabetes, a value <7% indicates  that their diabetes is well controlled and a value  greater than or equal to 7% indicates suboptimal  control. A1c targets should be individualized based on  duration of diabetes, age, comorbid conditions, and  other considerations. . Currently, no consensus exists regarding use of hemoglobin A1c for diagnosis of diabetes for children. .          Failed - Valid encounter within last 6 months    Recent Outpatient Visits          1 year ago Blurry vision, bilateral   [place] [name], [name], MD   1 year ago Constipation, unspecified constipation type   [place] Eulogio Bear, NP   1 year ago Uncontrolled type 2 diabetes mellitus with hyperosmolarity without coma, with long-term current use of insulin ([place])   [place] Susy Frizzle, MD   1 year ago Type 2 diabetes mellitus with hyperglycemia, without long-term current use of insulin ([place])   [place] Susy Frizzle, MD   2 years ago Uncontrolled type 2 diabetes mellitus with hyperosmolarity without coma, with long-term current use of insulin ([place])   [facility] [name], [name], MD

## 2022-05-11 ENCOUNTER — Other Ambulatory Visit: Payer: Self-pay

## 2022-05-11 NOTE — Telephone Encounter (Signed)
Pharmacy faxed a refill request for atenolol (TENORMIN) 50 MG tablet [[id number]]    Order Details Dose, Route, Frequency: As Directed  Dispense Quantity: 90 tablet Refills: 1        Sig: TAKE 1 TABLET BY MOUTH EVERY DAY       Start Date: 06/30/21 End Date: --  Written Date: 06/30/21 Expiration Date: 06/30/22

## 2022-05-12 MED ORDER — ATENOLOL 50 MG PO TABS: 50.0000 mg | ORAL_TABLET | Freq: Every day | ORAL | 1 refills | Status: DC

## 2022-05-12 NOTE — Telephone Encounter (Signed)
Requested Prescriptions  Pending Prescriptions Disp Refills  . atenolol (TENORMIN) 50 MG tablet 90 tablet 1    Sig: Take 1 tablet (50 mg total) by mouth daily.     Cardiovascular: Beta Blockers 2 Failed - 05/11/2022  1:46 PM      Failed - Cr in normal range and within 360 days    Creat  Date Value Ref Range Status  04/09/2022 1.52 (H) 0.70 - 1.22 mg/dL Final         Failed - Last BP in normal range    BP Readings from Last 1 Encounters:  04/09/22 (!) 152/82         Failed - Valid encounter within last 6 months    Recent Outpatient Visits          1 year ago Blurry vision, bilateral   [place] [name], [name], MD   1 year ago Constipation, unspecified constipation type   [place] Eulogio Bear, NP   1 year ago Uncontrolled type 2 diabetes mellitus with hyperosmolarity without coma, with long-term current use of insulin ([place])   [place] Susy Frizzle, MD   1 year ago Type 2 diabetes mellitus with hyperglycemia, without long-term current use of insulin ([place])   [place] Susy Frizzle, MD   2 years ago Uncontrolled type 2 diabetes mellitus with hyperosmolarity without coma, with long-term current use of insulin ([place])   [place] [name], [name], MD             Passed - Last Heart Rate in normal range    Pulse Readings from Last 1 Encounters:  04/09/22 77

## 2022-05-14 ENCOUNTER — Other Ambulatory Visit: Payer: Self-pay | Admitting: Family Medicine

## 2022-05-14 NOTE — Telephone Encounter (Signed)
Had check up 04/09/2022 so 3 mo. Refill given.  Requested Prescriptions  Pending Prescriptions Disp Refills  . FARXIGA 10 MG TABS tablet [Pharmacy Med Name: FARXIGA 10 MG TABLET] 30 tablet 0    Sig: TAKE 1 TABLET BY MOUTH DAILY BEFORE BREAKFAST.     Endocrinology:  Diabetes - SGLT2 Inhibitors Failed - 05/14/2022  2:35 AM      Failed - Cr in normal range and within 360 days    Creat  Date Value Ref Range Status  04/09/2022 1.52 (H) 0.70 - 1.22 mg/dL Final         Failed - HBA1C is between 0 and 7.9 and within 180 days    Hgb A1c MFr Bld  Date Value Ref Range Status  04/09/2022 8.7 (H) <5.7 % of total Hgb Final    Comment:    For someone without known diabetes, a hemoglobin A1c value of 6.5% or greater indicates that they may have  diabetes and this should be confirmed with a follow-up  test. . For someone with known diabetes, a value <7% indicates  that their diabetes is well controlled and a value  greater than or equal to 7% indicates suboptimal  control. A1c targets should be individualized based on  duration of diabetes, age, comorbid conditions, and  other considerations. . Currently, no consensus exists regarding use of hemoglobin A1c for diagnosis of diabetes for children. .          Failed - eGFR in normal range and within 360 days    GFR, Est African American  Date Value Ref Range Status  06/04/2020 38 (L) > OR = 60 mL/min/1.60m Final   GFR calc Af Amer  Date Value Ref Range Status  06/27/2020 33 (L) >60 mL/min Final   GFR, Est Non African American  Date Value Ref Range Status  06/04/2020 33 (L) > OR = 60 mL/min/1.756mFinal   GFR, Estimated  Date Value Ref Range Status  09/08/2020 41 (L) >60 mL/min Final    Comment:    (NOTE) Calculated using the CKD-EPI Creatinine Equation (2021)    eGFR  Date Value Ref Range Status  04/09/2022 44 (L) > OR = 60 mL/min/1.7360minal    Comment:    The eGFR is based on the CKD-EPI 2021 equation. To calculate  the  new eGFR from a previous Creatinine or Cystatin C result, go to https://www.kidney.org/professionals/ kdoqi/gfr%5Fcalculator          Failed - Valid encounter within last 6 months    Recent Outpatient Visits          1 year ago Blurry vision, bilateral   [place], [place] McgeeD   1 year ago Constipation, unspecified constipation type   [place]   1 year ago Uncontrolled type 2 diabetes mellitus with hyperosmolarity without coma, with long-term current use of insulin ([place] [facility]   1 year ago Type 2 diabetes mellitus with hyperglycemia, without long-term current use of insulin ([place] [facility]   2 years ago Uncontrolled type 2 diabetes mellitus with hyperosmolarity without coma, with long-term current use of insulin ([facility] [name], War[place]ammie McgeeD

## 2022-05-20 ENCOUNTER — Other Ambulatory Visit: Payer: Self-pay | Admitting: Family Medicine

## 2022-05-20 NOTE — Telephone Encounter (Signed)
Appointment 04/09/22- meets criteria for RF Requested Prescriptions  Pending Prescriptions Disp Refills  . TRULICITY 4.68 EH/2.1YY SOPN [Pharmacy Med Name: TRULICITY 4.82 NO/0.3 ML PEN]      Sig: INJECT 0.75 MG SUBCUTANEOUSLY ONE TIME PER WEEK     Endocrinology:  Diabetes - GLP-1 Receptor Agonists Failed - 05/20/2022  9:13 AM      Failed - HBA1C is between 0 and 7.9 and within 180 days    Hgb A1c MFr Bld  Date Value Ref Range Status  04/09/2022 8.7 (H) <5.7 % of total Hgb Final    Comment:    For someone without known diabetes, a hemoglobin A1c value of 6.5% or greater indicates that they may have  diabetes and this should be confirmed with a follow-up  test. . For someone with known diabetes, a value <7% indicates  that their diabetes is well controlled and a value  greater than or equal to 7% indicates suboptimal  control. A1c targets should be individualized based on  duration of diabetes, age, comorbid conditions, and  other considerations. . Currently, no consensus exists regarding use of hemoglobin A1c for diagnosis of diabetes for children. .          Failed - Valid encounter within last 6 months    Recent Outpatient Visits          1 year ago Blurry vision, bilateral   [place] [name], [name], MD   1 year ago Constipation, unspecified constipation type   [place] Eulogio Bear, NP   1 year ago Uncontrolled type 2 diabetes mellitus with hyperosmolarity without coma, with long-term current use of insulin ([place])   [place] Susy Frizzle, MD   1 year ago Type 2 diabetes mellitus with hyperglycemia, without long-term current use of insulin ([place])   [place] Susy Frizzle, MD   2 years ago Uncontrolled type 2 diabetes mellitus with hyperosmolarity without coma, with long-term current use of insulin ([place])   [facility] [name], [name], MD

## 2022-05-24 ENCOUNTER — Encounter: Payer: Self-pay | Admitting: Family Medicine

## 2022-05-25 ENCOUNTER — Encounter: Payer: Self-pay | Admitting: *Deleted

## 2022-05-25 ENCOUNTER — Ambulatory Visit (INDEPENDENT_AMBULATORY_CARE_PROVIDER_SITE_OTHER): Payer: PPO | Admitting: *Deleted

## 2022-05-25 DIAGNOSIS — E1100 Type 2 diabetes mellitus with hyperosmolarity without nonketotic hyperglycemic-hyperosmolar coma (NKHHC): Secondary | ICD-10-CM

## 2022-05-25 DIAGNOSIS — I10 Essential (primary) hypertension: Secondary | ICD-10-CM

## 2022-05-25 NOTE — Chronic Care Management (AMB) (Signed)
Chronic Care Management   CCM RN Visit Note  05/25/2022 Name: [name] MRN: [id number] DOB: 01/20/1931  Subjective: [name] is a 88 y.o. year old male who is a primary care patient of [name], Warren T, MD. The care management team was consulted for assistance with disease management and care coordination needs.    Engaged with patient by telephone for follow up visit in response to provider referral for case management and/or care coordination services.   Consent to Services:  The patient was given information about Chronic Care Management services, agreed to services, and gave verbal consent prior to initiation of services.  Please see initial visit note for detailed documentation.   Patient agreed to services and verbal consent obtained.   Assessment: Review of patient past medical history, allergies, medications, health status, including review of consultants reports, laboratory and other test data, was performed as part of comprehensive evaluation and provision of chronic care management services.   SDOH (Social Determinants of Health) assessments and interventions performed:    CCM Care Plan  Allergies  Allergen Reactions   Tape Other (See Comments)    PLEASE USE EASY-RELEASE TAPE!!!! THE SKIN IS VERY SENSITIVE!!    Outpatient Encounter Medications as of 05/25/2022  Medication Sig   albuterol (VENTOLIN HFA) 108 (90 Base) MCG/ACT inhaler Inhale 2 puffs into the lungs every 6 (six) hours as needed for wheezing or shortness of breath.   ASPIRIN LOW DOSE 81 MG EC tablet TAKE 1 TABLET (81 MG TOTAL) BY MOUTH DAILY. SWALLOW WHOLE.   atenolol (TENORMIN) 50 MG tablet Take 1 tablet (50 mg total) by mouth daily.   bisacodyl (DULCOLAX) 10 MG suppository Place 1 suppository (10 mg total) rectally as needed for moderate constipation.   FARXIGA 10 MG TABS tablet TAKE 1 TABLET BY MOUTH DAILY BEFORE BREAKFAST.   furosemide (LASIX) 20 MG tablet TAKE 1 TABLET BY MOUTH  EVERY DAY   glipiZIDE (GLUCOTROL XL) 10 MG 24 hr tablet TAKE 1 TABLET BY MOUTH EVERY DAY WITH BREAKFAST   Glucose Blood (BLOOD GLUCOSE TEST STRIPS) STRP Please dispense as One touch Ultra Blue. Use as directed to monitor FSBS 3x daily. Dx: E11.9.   LINZESS 145 MCG CAPS capsule TAKE 1 CAPSULE BY MOUTH EVERY DAY BEFORE BREAKFAST   losartan (COZAAR) 25 MG tablet Take 1 tablet (25 mg total) by mouth daily.   pioglitazone (ACTOS) 30 MG tablet TAKE 1 TABLET BY MOUTH EVERY DAY   TRULICITY 0.75 MG/0.5ML SOPN INJECT 0.75 MG SUBCUTANEOUSLY ONE TIME PER WEEK   No facility-administered encounter medications on file as of 05/25/2022.    Patient Active Problem List   Diagnosis Date Noted   Constipation 10/09/2020   Acute respiratory failure due to COVID-19 (HCC) 09/05/2020   AKI (acute kidney injury) (HCC) 09/05/2020   Hard of hearing 09/05/2020   Infection of skin of neck 06/26/2020   Aftercare following surgery of the circulatory system, NEC 05/15/2014   Carotid artery occlusion 02/23/2012   HTN (hypertension) 01/13/2011   Diabetes mellitus type II, uncontrolled 01/13/2011   BPH (benign prostatic hyperplasia) 01/13/2011   ASCVD (arteriosclerotic cardiovascular disease) 01/13/2011   History of CEA (carotid endarterectomy) 01/13/2011   Tobacco abuse, 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    Conditions to be addressed/monitored:HTN and DMII  Care Plan : RN Care Manager Plan of Care  Updates made by Farmer, Julie A, RN since 05/25/2022 12:00 AM  Completed 05/25/2022   Problem: No plan of care established for management of chronic disease   states  (DM2, ASCVD with HTN) Resolved 05/25/2022  Priority: High     Long-Range Goal: Development of plan of care for chronic disease management  (DM2, ASCVD with HTN) Completed 05/25/2022  Start Date: 10/31/2021  Expected End Date: 09/09/2022  Priority: High  Note:   Current Barriers:  Knowledge Deficits related to plan of care for management of CAD, HTN, and DMII  Spoke  with patient and spouse [name] (permission given by pt) as patient is hard of hearing, spouse reports pt is overall independent with his care, still drives around town and close to home, gets outside and walks. Patient/ spouse reports usually checks CBG once daily but has not checked recently (patient's daughter is a nurse and checks CBG) will resume checking CBG, reports "blood sugar is always about the same", does not eat a special diet, tries to avoid fast food, does drink sweet tea and sodas at times. Blood pressure checked on occasion by adult daughter (she is a nurse)- spouse is unsure of the readings Patient reports he is feeling well, appetite is good, no new changes to report. 05/25/22- spoke with patient's wife who reports "he's doing good, no changes"  spouse unsure of CBG readings and blood pressure is checked on occasion, has all medications and taking as presribed.  RNCM Clinical Goal(s):  Patient will verbalize understanding of plan for management of CAD, HTN, and DMII as evidenced by patient report, review of EHR and  through collaboration with RN Care manager, provider, and care team.   Interventions: 1:1 collaboration with primary care provider regarding development and update of comprehensive plan of care as evidenced by provider attestation and co-signature Inter-disciplinary care team collaboration (see longitudinal plan of care) Evaluation of current treatment plan related to  self management and patient's adherence to plan as established by provider   CAD Interventions: (Status:  New goal. and Goal on track:  Yes.) Long Term Goal Assessed understanding of CAD diagnosis Medications reviewed including medications utilized in CAD treatment plan Reviewed Importance of taking all medications as prescribed Reviewed importance of checking blood pressure and recording Reinforced importance of adherence to heart healthy diet and eating lean meats, fresh/ frozen vegetables if  possible Reinforced importance of exercise even if only few minutes per day Reviewed plan of care with spouse including case closure  Diabetes Interventions:  (Status:  New goal. and Goal on track:  Yes.) Long Term Goal Assessed patient's understanding of A1c goal: <7% Reviewed medications with patient and discussed importance of medication adherence Review of patient status, including review of consultants reports, relevant laboratory and other test results, and medications completed Reinforced carbohydrate modified diet Reviewed importance of staying active Reviewed importance of having daughter check CBG at least once daily and record Lab Results  Component Value Date   HGBA1C 10.6 (H) 06/04/2020   Hypertension Interventions:  (Status:  New goal. and Goal on track:  Yes.) Long Term Goal Last practice recorded BP readings:  BP Readings from Last 3 Encounters:  08/26/21 (!) 170/83  04/22/21 (!) 177/99  04/08/21 135/78  Most recent eGFR/CrCl: No results found for: EGFR  No components found for: CRCL  Evaluation of current treatment plan related to hypertension self management and patient's adherence to plan as established by provider Reviewed medications with patient and discussed importance of compliance Advised patient, providing education and rationale, to monitor blood pressure daily and record, calling PCP for findings outside established parameters Discussed complications of poorly controlled blood pressure such as heart disease,   stroke, circulatory complications, vision complications, kidney impairment, sexual dysfunction Reviewed importance of following low sodium diet, limiting/ avoiding salty snacks, foods high in sodium content   Patient Goals/Self-Care Activities: Take medications as prescribed   Attend all scheduled provider appointments Perform all self care activities independently  Call provider office for new concerns or questions  check blood sugar at prescribed  times: once daily check feet daily for cuts, sores or redness enter blood sugar readings and medication or insulin into daily log take the blood sugar log to all doctor visits take the blood sugar meter to all doctor visits trim toenails straight across limit fast food meals to no more than 1 per week read food labels for fat, fiber, carbohydrates and portion size keep feet up while sitting wash and dry feet carefully every day check blood pressure weekly choose a place to take my blood pressure (home, clinic or office, retail store) write blood pressure results in a log or diary keep a blood pressure log take blood pressure log to all doctor appointments keep all doctor appointments take medications for blood pressure exactly as prescribed eat more whole grains, fruits and vegetables, lean meats and healthy fats Continue to follow low sodium diet, limit fast food and read food labels Be mindful of carbohydrate intake at each meal- too much bread, pasta, rice, potatoes, etc will elevate blood sugar Try to get outdoors daily and walk, even if only for a few minutes Talk with your daughter about checking your blood sugar daily and recording Case closure today- please talk with your doctor if you have any future care management needs       Plan:No further follow up required: case closure today  Jacqlyn Larsen [facility], BSN RN Case Manager [place] [phone]

## 2022-05-25 NOTE — Patient Instructions (Signed)
Visit Information  Thank you for taking time to visit with me today. Please don't hesitate to contact me if I can be of assistance to you before our next scheduled telephone appointment.  Following are the goals we discussed today:  Take medications as prescribed   Attend all scheduled provider appointments Perform all self care activities independently  Call provider office for new concerns or questions  check blood sugar at prescribed times: once daily check feet daily for cuts, sores or redness enter blood sugar readings and medication or insulin into daily log take the blood sugar log to all doctor visits take the blood sugar meter to all doctor visits trim toenails straight across limit fast food meals to no more than 1 per week read food labels for fat, fiber, carbohydrates and portion size keep feet up while sitting wash and dry feet carefully every day check blood pressure weekly choose a place to take my blood pressure (home, clinic or office, retail store) write blood pressure results in a log or diary keep a blood pressure log take blood pressure log to all doctor appointments keep all doctor appointments take medications for blood pressure exactly as prescribed eat more whole grains, fruits and vegetables, lean meats and healthy fats Continue to follow low sodium diet, limit fast food and read food labels Be mindful of carbohydrate intake at each meal- too much bread, pasta, rice, potatoes, etc will elevate blood sugar Try to get outdoors daily and walk, even if only for a few minutes Talk with your daughter about checking your blood sugar daily and recording Case closure today- please talk with your doctor if you have any future care management needs  Please call the care guide team at [phone] if you need to cancel or reschedule your appointment.   If you are experiencing a Mental Health or [place] or need someone to talk to, please call the  Suicide and Crisis Lifeline: 988 call the [place] National Suicide Prevention Lifeline: 507-885-3015 or TTY: 838-636-1715 TTY 9592768501) to talk to a trained counselor call 1-800-273-TALK (toll free, 24 hour hotline) go to [facility] [address] [place] [phone]) call 911   Patient verbalizes understanding of instructions and care plan provided today and agrees to view in [place]. Active MyChart status and patient understanding of how to access instructions and care plan via MyChart confirmed with patient.     Jacqlyn Larsen RNC, BSN RN Case Manager [place] [facility] [phone]

## 2022-06-01 ENCOUNTER — Telehealth: Payer: Self-pay

## 2022-06-11 DIAGNOSIS — I2510 Atherosclerotic heart disease of native coronary artery without angina pectoris: Secondary | ICD-10-CM | POA: Diagnosis not present

## 2022-06-11 DIAGNOSIS — I10 Essential (primary) hypertension: Secondary | ICD-10-CM | POA: Diagnosis not present

## 2022-06-11 DIAGNOSIS — Z794 Long term (current) use of insulin: Secondary | ICD-10-CM

## 2022-06-11 DIAGNOSIS — E1159 Type 2 diabetes mellitus with other circulatory complications: Secondary | ICD-10-CM

## 2022-06-20 ENCOUNTER — Emergency Department (HOSPITAL_BASED_OUTPATIENT_CLINIC_OR_DEPARTMENT_OTHER)
Admission: EM | Admit: 2022-06-20 | Discharge: 2022-06-20 | Discharge status: Against Medical Advice | Payer: PPO | Source: Home / Self Care

## 2022-06-20 ENCOUNTER — Other Ambulatory Visit: Payer: Self-pay

## 2022-06-22 ENCOUNTER — Ambulatory Visit (INDEPENDENT_AMBULATORY_CARE_PROVIDER_SITE_OTHER): Payer: PPO | Admitting: Family Medicine

## 2022-06-22 ENCOUNTER — Other Ambulatory Visit: Payer: PPO

## 2022-06-22 ENCOUNTER — Other Ambulatory Visit: Payer: Self-pay | Admitting: Family Medicine

## 2022-06-22 ENCOUNTER — Ambulatory Visit
Admission: RE | Admit: 2022-06-22 | Discharge: 2022-06-22 | Disposition: A | Discharge status: Home / Self Care | Payer: PPO | Source: Ambulatory Visit | Attending: Family Medicine | Admitting: Family Medicine

## 2022-06-22 VITALS — BP 148/78 | HR 58 | Temp 98.7°F | Ht 64.0 in | Wt 168.0 lb

## 2022-06-22 DIAGNOSIS — E1100 Type 2 diabetes mellitus with hyperosmolarity without nonketotic hyperglycemic-hyperosmolar coma (NKHHC): Secondary | ICD-10-CM | POA: Diagnosis not present

## 2022-06-22 DIAGNOSIS — Z794 Long term (current) use of insulin: Secondary | ICD-10-CM | POA: Diagnosis not present

## 2022-06-22 DIAGNOSIS — R6889 Other general symptoms and signs: Secondary | ICD-10-CM | POA: Diagnosis not present

## 2022-06-22 DIAGNOSIS — R509 Fever, unspecified: Secondary | ICD-10-CM | POA: Diagnosis not present

## 2022-06-22 LAB — URINE CULTURE
MICRO NUMBER:: 13898791
SPECIMEN QUALITY:: ADEQUATE

## 2022-06-22 LAB — URINALYSIS, ROUTINE W REFLEX MICROSCOPIC
Bilirubin Urine: NEGATIVE
Crystals: NONE SEEN /HPF
Hyaline Cast: NONE SEEN /LPF
Ketones, ur: NEGATIVE
Nitrite: POSITIVE — AB
Protein, ur: NEGATIVE
Specific Gravity, Urine: 1.021 (ref 1.001–1.035)
Yeast: NONE SEEN /HPF
pH: 5 (ref 5.0–8.0)

## 2022-06-22 LAB — MICROSCOPIC MESSAGE

## 2022-06-22 MED ORDER — LANTUS SOLOSTAR 100 UNIT/ML ~~LOC~~ SOPN: 10.0000 [IU] | PEN_INJECTOR | Freq: Every day | SUBCUTANEOUS | 99 refills | Status: DC

## 2022-06-22 MED ORDER — CEPHALEXIN 500 MG PO CAPS: 500.0000 mg | ORAL_CAPSULE | Freq: Three times a day (TID) | ORAL | 0 refills | Status: DC

## 2022-06-22 NOTE — Progress Notes (Signed)
Subjective:    Patient ID: [name], male    DOB: [birth year], 88 y.o.   MRN: [id number] Patient presents to.  He is an 88 year old Caucasian gentleman with a history of dementia and noncompliance.  His last A1c was greater than 8 and we added [place].  Today he presents complaining of a 2-week history of shaking rigors.  Daughter states that it occurs on a daily basis.  He will shake uncontrollably for about an hour.  He is awake and alert while this is happening.  He does not seizure-like activity but rather like chills.  He does report increased urinary frequency.  He reports a strong urinary stream.  He denies any dysuria or hematuria.  He denies any cough shortness of breath or chest pain.  He denies any nausea or vomiting.  He denies any headache or sinus pain or sore throat or otalgia.  He denies any rash or tick bites.  However his fasting blood sugars in the morning have been around 350.  He has been taking Glucotrol.  He has been taking [place].  He is also taking Trulicity.  He has not been taking Actos. Past Medical History:  Diagnosis Date   Arthritis    knee   Carotid artery occlusion    Diabetes mellitus    metformin,januvia,and glipizide daily   Enlarged prostate    pt states no trouble with it   History of COVID-19 08/2020   Hyperlipidemia    takes Simvastatin daily   Hypertension    takes Atenolol and Lisinopril daily   Impaired hearing, left    but doesn't wear hearing aids   Seasonal allergies    Past Surgical History:  Procedure Laterality Date   APPENDECTOMY     as a child   CAROTID ANGIOGRAM N/A 05/09/2013   Procedure: CAROTID ANGIOGRAM;  Surgeon: Serafina Mitchell, MD;  Location: [facility] CATH LAB;  Service: Cardiovascular;  Laterality: N/A;   CAROTID ENDARTERECTOMY Right 2008    Re-do Mar 04, 2012   carotidendartectomy  2008   right side   CATARACT EXTRACTION W/PHACO Left 04/08/2021   Procedure: CATARACT EXTRACTION PHACO AND INTRAOCULAR LENS PLACEMENT  (IOC) LEFT DIABETIC 15.64 01:28.9;  Surgeon: Birder Robson, MD;  Location: [facility];  Service: Ophthalmology;  Laterality: Left;   CATARACT EXTRACTION W/PHACO Right 04/22/2021   Procedure: CATARACT EXTRACTION PHACO AND INTRAOCULAR LENS PLACEMENT (IOC) RIGHT DIABETIC 12.52 01:16.4;  Surgeon: Birder Robson, MD;  Location: [facility];  Service: Ophthalmology;  Laterality: Right;   ENDARTERECTOMY  03/04/2012   Procedure: ENDARTERECTOMY CAROTID;  Surgeon: Rosetta Posner, MD;  Location: [facility] OR;  Service: Vascular;  Laterality: Right;  Redo Right Carotid Endarterectomy with hemasheild patch angioplasty   ENDARTERECTOMY Right 06/26/2020   Procedure: REDO CAROTID ENDARTERECTOMY;  Surgeon: Rosetta Posner, MD;  Location: [facility] OR;  Service: Vascular;  Laterality: Right;   WOUND EXPLORATION Right 06/26/2020   Procedure: EXPLORATION OF RIGHT NECK INCISION;  Surgeon: Rosetta Posner, MD;  Location: [facility] OR;  Service: Vascular;  Laterality: Right;   Current Outpatient Medications on File Prior to Visit  Medication Sig Dispense Refill   albuterol (VENTOLIN HFA) 108 (90 Base) MCG/ACT inhaler Inhale 2 puffs into the lungs every 6 (six) hours as needed for wheezing or shortness of breath. 6.7 g 0   ASPIRIN LOW DOSE 81 MG EC tablet TAKE 1 TABLET (81 MG TOTAL) BY MOUTH DAILY. SWALLOW WHOLE. 90 tablet 3   atenolol (TENORMIN) 50 MG tablet  Take 1 tablet (50 mg total) by mouth daily. 90 tablet 1   bisacodyl (DULCOLAX) 10 MG suppository Place 1 suppository (10 mg total) rectally as needed for moderate constipation. 12 suppository 0   FARXIGA 10 MG TABS tablet TAKE 1 TABLET BY MOUTH DAILY BEFORE BREAKFAST. 90 tablet 0   furosemide (LASIX) 20 MG tablet TAKE 1 TABLET BY MOUTH EVERY DAY 90 tablet 3   glipiZIDE (GLUCOTROL XL) 10 MG 24 hr tablet TAKE 1 TABLET BY MOUTH EVERY DAY WITH BREAKFAST 90 tablet 3   Glucose Blood (BLOOD GLUCOSE TEST STRIPS) STRP Please dispense as One touch Ultra Blue. Use as directed to  monitor FSBS 3x daily. Dx: E11.9. 100 each 4   LINZESS 145 MCG CAPS capsule TAKE 1 CAPSULE BY MOUTH EVERY DAY BEFORE BREAKFAST 30 capsule 2   TRULICITY 0.16 WF/0.9NA SOPN INJECT 0.75 MG SUBCUTANEOUSLY ONE TIME PER WEEK 2 mL 1   losartan (COZAAR) 25 MG tablet Take 1 tablet (25 mg total) by mouth daily. (Patient not taking: Reported on 06/22/2022) 90 tablet 3   pioglitazone (ACTOS) 30 MG tablet TAKE 1 TABLET BY MOUTH EVERY DAY (Patient not taking: Reported on 06/22/2022) 90 tablet 2   No current facility-administered medications on file prior to visit.   Allergies  Allergen Reactions   Tape Other (See Comments)    PLEASE USE EASY-RELEASE TAPE!!!! THE SKIN IS VERY SENSITIVE!!   Social History   Socioeconomic History   Marital status: Married    Spouse name: Nadiene   Number of children: 2   Years of education: Not on file   Highest education level: Not on file  Occupational History   Not on file  Tobacco Use   Smoking status: Former    Years: 25.00    Types: Cigarettes    Quit date: 10/13/1991    Years since quitting: 30.7   Smokeless tobacco: Never  Vaping Use   Vaping Use: Never used  Substance and Sexual Activity   Alcohol use: No    Comment: occassional glass of wine   Drug use: No   Sexual activity: Not Currently  Other Topics Concern   Not on file  Social History Narrative   2 daughters.    Wife is currently undergoing cancer treatments.    Social Determinants of Health   Financial Resource Strain: Low Risk  (11/21/2021)   Overall Financial Resource Strain (CARDIA)    Difficulty of Paying Living Expenses: Not hard at all  Food Insecurity: No Food Insecurity (11/21/2021)   Hunger Vital Sign    Worried About Running Out of Food in the Last Year: Never true    Ran Out of Food in the Last Year: Never true  Transportation Needs: No Transportation Needs (11/21/2021)   PRAPARE - Hydrologist (Medical): No    Lack of Transportation (Non-Medical):  No  Physical Activity: Inactive (11/21/2021)   Exercise Vital Sign    Days of Exercise per Week: 0 days    Minutes of Exercise per Session: 0 min  Stress: No Stress Concern Present (11/21/2021)   [place]    Feeling of Stress : Not at all  Social Connections: [place] (11/21/2021)   Social Connection and Isolation Panel [NHANES]    Frequency of Communication with Friends and Family: More than three times a week    Frequency of Social Gatherings with Friends and Family: More than three times a week  Attends Religious Services: 1 to 4 times per year    Active Member of Clubs or Organizations: Yes    Attends Archivist Meetings: 1 to 4 times per year    Marital Status: Married  Human resources officer Violence: Not At Risk (11/21/2021)   Humiliation, Afraid, Rape, and Kick questionnaire    Fear of Current or Ex-Partner: No    Emotionally Abused: No    Physically Abused: No    Sexually Abused: No      Review of Systems  All other systems reviewed and are negative.      Objective:   Physical Exam Vitals reviewed.  Constitutional:      General: He is not in acute distress.    Appearance: He is well-developed. He is not diaphoretic.  Eyes:     General: Lids are normal.        Right eye: No foreign body.        Left eye: No foreign body.     Extraocular Movements: Extraocular movements intact.     Conjunctiva/sclera:     Right eye: Right conjunctiva is not injected. No chemosis or exudate.    Left eye: Left conjunctiva is not injected. No chemosis or exudate. Neck:     Thyroid: No thyromegaly.     Vascular: No JVD.  Cardiovascular:     Rate and Rhythm: Normal rate and regular rhythm.     Heart sounds: Normal heart sounds. No murmur heard.    No friction rub. No gallop.  Pulmonary:     Effort: Pulmonary effort is normal. No respiratory distress.     Breath sounds: Normal breath sounds. No  wheezing or rales.  Abdominal:     General: Bowel sounds are normal. There is no distension.     Palpations: Abdomen is soft. There is no mass.     Tenderness: There is no abdominal tenderness. There is no guarding or rebound.  Musculoskeletal:     Cervical back: Neck supple.  Lymphadenopathy:     Cervical: No cervical adenopathy.           Assessment & Plan:  Fever, unspecified fever cause - Plan: COMPLETE METABOLIC PANEL WITH GFR, CBC with Differential/Platelet, Urinalysis, Routine w reflex microscopic, Urine Culture, DG Chest 2 View  Uncontrolled type 2 diabetes mellitus with hyperosmolarity without coma, with long-term current use of insulin ([place])  Rigors Patient does not appear clinically ill today on exam.  However am concerned that the rigors could be a manifestation of fever.  This been going on on a daily basis for 2 weeks I believe he qualifies for fever of unknown origin.  Begin by obtaining a chest x-ray, urinalysis, urine culture, CBC, and a CMP.  Would suspect possible prostatitis unless there is an obvious source on the lab work.  Also on the differential diagnosis given age would be malignancy.  Meanwhile try to start bringing his sugars down.  Begin Lantus 10 units subcu every morning.  Increase Lantus by 1 unit daily until fasting blood sugars are less than 130.  Recheck later this week and make a gross adjustment in his insulin.

## 2022-06-23 ENCOUNTER — Other Ambulatory Visit: Payer: Self-pay | Admitting: Family Medicine

## 2022-06-23 ENCOUNTER — Encounter: Payer: Self-pay | Admitting: Family Medicine

## 2022-06-23 LAB — CBC WITH DIFFERENTIAL/PLATELET
Absolute Monocytes: 1184 cells/uL — ABNORMAL HIGH (ref 200–950)
Basophils Absolute: 27 cells/uL (ref 0–200)
Basophils Relative: 0.3 %
Eosinophils Absolute: 98 cells/uL (ref 15–500)
Eosinophils Relative: 1.1 %
HCT: 46.8 % (ref 38.5–50.0)
Hemoglobin: 15.2 g/dL (ref 13.2–17.1)
Lymphs Abs: 1691 cells/uL (ref 850–3900)
MCH: 30.8 pg (ref 27.0–33.0)
MCHC: 32.5 g/dL (ref 32.0–36.0)
MCV: 94.7 fL (ref 80.0–100.0)
MPV: 10.2 fL (ref 7.5–12.5)
Monocytes Relative: 13.3 %
Neutro Abs: 5901 cells/uL (ref 1500–7800)
Neutrophils Relative %: 66.3 %
Platelets: 226 10*3/uL (ref 140–400)
RBC: 4.94 10*6/uL (ref 4.20–5.80)
RDW: 12.4 % (ref 11.0–15.0)
Total Lymphocyte: 19 %
WBC: 8.9 10*3/uL (ref 3.8–10.8)

## 2022-06-23 LAB — COMPLETE METABOLIC PANEL WITH GFR
AG Ratio: 1.3 (calc) (ref 1.0–2.5)
ALT: 21 U/L (ref 9–46)
AST: 12 U/L (ref 10–35)
Albumin: 3.8 g/dL (ref 3.6–5.1)
Alkaline phosphatase (APISO): 122 U/L (ref 35–144)
BUN/Creatinine Ratio: 24 (calc) — ABNORMAL HIGH (ref 6–22)
BUN: 41 mg/dL — ABNORMAL HIGH (ref 7–25)
CO2: 21 mmol/L (ref 20–32)
Calcium: 9.3 mg/dL (ref 8.6–10.3)
Chloride: 99 mmol/L (ref 98–110)
Creat: 1.69 mg/dL — ABNORMAL HIGH (ref 0.70–1.22)
Globulin: 3 g/dL (calc) (ref 1.9–3.7)
Glucose, Bld: 431 mg/dL — ABNORMAL HIGH (ref 65–99)
Potassium: 5.3 mmol/L (ref 3.5–5.3)
Sodium: 130 mmol/L — ABNORMAL LOW (ref 135–146)
Total Bilirubin: 0.5 mg/dL (ref 0.2–1.2)
Total Protein: 6.8 g/dL (ref 6.1–8.1)
eGFR: 38 mL/min/{1.73_m2} — ABNORMAL LOW (ref >=60)

## 2022-06-23 MED ORDER — DAPAGLIFLOZIN PROPANEDIOL 10 MG PO TABS: 10.0000 mg | ORAL_TABLET | Freq: Every day | ORAL | 3 refills | Status: DC

## 2022-06-24 ENCOUNTER — Other Ambulatory Visit: Payer: Self-pay | Admitting: Family Medicine

## 2022-06-24 NOTE — Telephone Encounter (Signed)
Pt last OV was 06/22/22, within a year. Will refill medication.  Requested Prescriptions  Pending Prescriptions Disp Refills  . pioglitazone (ACTOS) 30 MG tablet [Pharmacy Med Name: PIOGLITAZONE HCL 30 MG TABLET] 90 tablet 2    Sig: TAKE 1 TABLET BY MOUTH EVERY DAY     Endocrinology:  Diabetes - Glitazones - pioglitazone Failed - 06/23/2022  2:45 PM      Failed - HBA1C is between 0 and 7.9 and within 180 days    Hgb A1c MFr Bld  Date Value Ref Range Status  04/09/2022 8.7 (H) <5.7 % of total Hgb Final    Comment:    For someone without known diabetes, a hemoglobin A1c value of 6.5% or greater indicates that they may have  diabetes and this should be confirmed with a follow-up  test. . For someone with known diabetes, a value <7% indicates  that their diabetes is well controlled and a value  greater than or equal to 7% indicates suboptimal  control. A1c targets should be individualized based on  duration of diabetes, age, comorbid conditions, and  other considerations. . Currently, no consensus exists regarding use of hemoglobin A1c for diagnosis of diabetes for children. .          Failed - Valid encounter within last 6 months    Recent Outpatient Visits          1 year ago Blurry vision, bilateral   [place] [name], [name], MD   1 year ago Constipation, unspecified constipation type   [place] Noemi Chapel A, NP   2 years ago Uncontrolled type 2 diabetes mellitus with hyperosmolarity without coma, with long-term current use of insulin ([place])   [place] Susy Frizzle, MD   2 years ago Type 2 diabetes mellitus with hyperglycemia, without long-term current use of insulin ([place])   [place] Susy Frizzle, MD   2 years ago Uncontrolled type 2 diabetes mellitus with hyperosmolarity without coma, with long-term current use of insulin ([place])   [facility] [name], [name],  MD

## 2022-06-24 NOTE — Telephone Encounter (Signed)
Pharmacy faxed a refill request for glipiZIDE (GLUCOTROL XL) 10 MG 24 hr tablet [[id number]]    Order Details Dose, Route, Frequency: As Directed  Dispense Quantity: 90 tablet Refills: 3        Sig: TAKE 1 TABLET BY MOUTH EVERY DAY WITH BREAKFAST       Start Date: 04/15/21 End Date: --  Written Date: 04/15/21 Expiration Date: 04/15/22  Original Order:  glipiZIDE (GLUCOTROL XL) 10 MG 24 hr tablet [[id number]]

## 2022-06-25 MED ORDER — GLIPIZIDE ER 10 MG PO TB24: ORAL_TABLET | ORAL | 2 refills | Status: DC

## 2022-06-25 NOTE — Telephone Encounter (Signed)
Requested Prescriptions  Pending Prescriptions Disp Refills  . glipiZIDE (GLUCOTROL XL) 10 MG 24 hr tablet 90 tablet 1    Sig: TAKE 1 TABLET BY MOUTH EVERY DAY WITH BREAKFAST     Endocrinology:  Diabetes - Sulfonylureas Failed - 06/25/2022 12:07 PM      Failed - HBA1C is between 0 and 7.9 and within 180 days    Hgb A1c MFr Bld  Date Value Ref Range Status  04/09/2022 8.7 (H) <5.7 % of total Hgb Final    Comment:    For someone without known diabetes, a hemoglobin A1c value of 6.5% or greater indicates that they may have  diabetes and this should be confirmed with a follow-up  test. . For someone with known diabetes, a value <7% indicates  that their diabetes is well controlled and a value  greater than or equal to 7% indicates suboptimal  control. A1c targets should be individualized based on  duration of diabetes, age, comorbid conditions, and  other considerations. . Currently, no consensus exists regarding use of hemoglobin A1c for diagnosis of diabetes for children. .          Failed - Cr in normal range and within 360 days    Creat  Date Value Ref Range Status  06/22/2022 1.69 (H) 0.70 - 1.22 mg/dL Final         Failed - Valid encounter within last 6 months    Recent Outpatient Visits          1 year ago Blurry vision, bilateral   [place] [name], [name], MD   1 year ago Constipation, unspecified constipation type   [place] Noemi Chapel A, NP   2 years ago Uncontrolled type 2 diabetes mellitus with hyperosmolarity without coma, with long-term current use of insulin ([place])   [place] Susy Frizzle, MD   2 years ago Type 2 diabetes mellitus with hyperglycemia, without long-term current use of insulin ([place])   [place] Susy Frizzle, MD   2 years ago Uncontrolled type 2 diabetes mellitus with hyperosmolarity without coma, with long-term current use of insulin ([place])   [facility] [name], [name], MD

## 2022-06-26 NOTE — Telephone Encounter (Signed)
Pt is coming on Monday, 06/29/22 at 11:15am for an OV with you.

## 2022-06-29 ENCOUNTER — Ambulatory Visit: Payer: PPO | Admitting: Family Medicine

## 2022-07-07 ENCOUNTER — Other Ambulatory Visit: Payer: PPO

## 2022-07-19 ENCOUNTER — Emergency Department (HOSPITAL_COMMUNITY)
Admission: EM | Admit: 2022-07-19 | Discharge: 2022-07-20 | Disposition: A | Discharge status: Home / Self Care | Payer: PPO | Attending: Emergency Medicine | Admitting: Emergency Medicine

## 2022-07-19 ENCOUNTER — Encounter (HOSPITAL_COMMUNITY): Payer: Self-pay | Admitting: Emergency Medicine

## 2022-07-19 DIAGNOSIS — Z794 Long term (current) use of insulin: Secondary | ICD-10-CM | POA: Insufficient documentation

## 2022-07-19 DIAGNOSIS — E119 Type 2 diabetes mellitus without complications: Secondary | ICD-10-CM | POA: Insufficient documentation

## 2022-07-19 DIAGNOSIS — R4182 Altered mental status, unspecified: Secondary | ICD-10-CM | POA: Insufficient documentation

## 2022-07-19 DIAGNOSIS — R410 Disorientation, unspecified: Secondary | ICD-10-CM | POA: Diagnosis not present

## 2022-07-19 DIAGNOSIS — I10 Essential (primary) hypertension: Secondary | ICD-10-CM | POA: Diagnosis not present

## 2022-07-19 DIAGNOSIS — Z79899 Other long term (current) drug therapy: Secondary | ICD-10-CM | POA: Diagnosis not present

## 2022-07-19 DIAGNOSIS — Z7984 Long term (current) use of oral hypoglycemic drugs: Secondary | ICD-10-CM | POA: Insufficient documentation

## 2022-07-19 DIAGNOSIS — R112 Nausea with vomiting, unspecified: Secondary | ICD-10-CM

## 2022-07-19 LAB — BASIC METABOLIC PANEL
Anion gap: 8 (ref 5–15)
BUN: 30 mg/dL — ABNORMAL HIGH (ref 8–23)
CO2: 21 mmol/L — ABNORMAL LOW (ref 22–32)
Calcium: 8.6 mg/dL — ABNORMAL LOW (ref 8.9–10.3)
Chloride: 108 mmol/L (ref 98–111)
Creatinine, Ser: 1.58 mg/dL — ABNORMAL HIGH (ref 0.61–1.24)
GFR, Estimated: 42 mL/min — ABNORMAL LOW (ref >60)
Glucose, Bld: 217 mg/dL — ABNORMAL HIGH (ref 70–99)
Potassium: 3.9 mmol/L (ref 3.5–5.1)
Sodium: 137 mmol/L (ref 135–145)

## 2022-07-19 LAB — URINALYSIS, ROUTINE W REFLEX MICROSCOPIC
Bilirubin Urine: NEGATIVE
Glucose, UA: >=500 mg/dL — AB
Hgb urine dipstick: NEGATIVE
Ketones, ur: NEGATIVE mg/dL
Leukocytes,Ua: NEGATIVE
Nitrite: POSITIVE — AB
Protein, ur: NEGATIVE mg/dL
Specific Gravity, Urine: 1.016 (ref 1.005–1.030)
pH: 5 (ref 5.0–8.0)

## 2022-07-19 LAB — CBC WITH DIFFERENTIAL/PLATELET
Abs Immature Granulocytes: 0.05 10*3/uL (ref 0.00–0.07)
Basophils Absolute: 0 10*3/uL (ref 0.0–0.1)
Basophils Relative: 0 %
Eosinophils Absolute: 0 10*3/uL (ref 0.0–0.5)
Eosinophils Relative: 0 %
HCT: 39.1 % (ref 39.0–52.0)
Hemoglobin: 12.5 g/dL — ABNORMAL LOW (ref 13.0–17.0)
Immature Granulocytes: 0 %
Lymphocytes Relative: 2 %
Lymphs Abs: 0.2 10*3/uL — ABNORMAL LOW (ref 0.7–4.0)
MCH: 30.6 pg (ref 26.0–34.0)
MCHC: 32 g/dL (ref 30.0–36.0)
MCV: 95.8 fL (ref 80.0–100.0)
Monocytes Absolute: 0.6 10*3/uL (ref 0.1–1.0)
Monocytes Relative: 4 %
Neutro Abs: 14 10*3/uL — ABNORMAL HIGH (ref 1.7–7.7)
Neutrophils Relative %: 94 %
Platelets: 127 10*3/uL — ABNORMAL LOW (ref 150–400)
RBC: 4.08 MIL/uL — ABNORMAL LOW (ref 4.22–5.81)
RDW: 13.9 % (ref 11.5–15.5)
WBC: 14.9 10*3/uL — ABNORMAL HIGH (ref 4.0–10.5)
nRBC: 0 % (ref 0.0–0.2)

## 2022-07-19 MED ORDER — SODIUM CHLORIDE 0.9 % IV BOLUS
1000.0000 mL | Freq: Once | INTRAVENOUS | Status: AC
  Administered 2022-07-20: 1000 mL via INTRAVENOUS

## 2022-07-19 NOTE — ED Triage Notes (Signed)
Pt BIB [facility] from home. Faily states that he has been more confused today. They report that he has been incontinent and started vomiting a couple of hours ago. Hx of UTI and medication noncompliance. HOH.

## 2022-07-19 NOTE — ED Provider Notes (Signed)
[facility] DEPT Provider Note   CSN: [id number] Arrival date & time: 07/19/22  2031     History {Add pertinent medical, surgical, social history, OB history to HPI:1} Chief Complaint  Patient presents with   Altered Mental Status    [name] is a 88 y.o. male.  Patient has a history of diabetes hypertension and cannot hear very well.  According to the family he has been more confused lately and has been persistently vomiting today.  Family is concerned he might have a urinary tract infection  The history is provided by the patient and medical records. No language interpreter was used.  Emesis Severity:  Mild Timing:  Constant Quality:  Bilious material Able to tolerate:  Liquids Progression:  Resolved Chronicity:  New Recent urination:  Normal Relieved by:  Nothing Worsened by:  Nothing Ineffective treatments:  None tried Associated symptoms: no abdominal pain, no cough, no diarrhea and no headaches   Risk factors: no alcohol use        Home Medications Prior to Admission medications   Medication Sig Start Date End Date Taking? Authorizing Provider  albuterol (VENTOLIN HFA) 108 (90 Base) MCG/ACT inhaler Inhale 2 puffs into the lungs every 6 (six) hours as needed for wheezing or shortness of breath. 09/08/20  Yes Thurnell Lose, MD  ASPIRIN LOW DOSE 81 MG EC tablet TAKE 1 TABLET (81 MG TOTAL) BY MOUTH DAILY. SWALLOW WHOLE. Patient taking differently: Take 81 mg by mouth in the morning. 11/05/21  Yes Waynetta Sandy, MD  atenolol (TENORMIN) 50 MG tablet Take 1 tablet (50 mg total) by mouth daily. 05/12/22  Yes Susy Frizzle, MD  dapagliflozin propanediol (FARXIGA) 10 MG TABS tablet Take 1 tablet (10 mg total) by mouth daily before breakfast. 06/23/22  Yes Susy Frizzle, MD  docusate sodium ([employer] STOOL SOFTENER) 100 MG capsule Take 100 mg by mouth 3 (three) times a week.   Yes [provider]  glipiZIDE  (GLUCOTROL XL) 10 MG 24 hr tablet TAKE 1 TABLET BY MOUTH EVERY DAY WITH BREAKFAST Patient taking differently: Take 10 mg by mouth daily with breakfast. 06/25/22  Yes [name], [name], MD  LINZESS 145 MCG CAPS capsule TAKE 1 CAPSULE BY MOUTH EVERY DAY BEFORE BREAKFAST Patient taking differently: Take 145 mcg by mouth daily as needed (for constipation). 03/25/22  Yes Lemmon, Lavone Nian, PA  TRULICITY 1.30 QM/5.7QI SOPN INJECT 0.75 MG SUBCUTANEOUSLY ONE TIME PER WEEK Patient taking differently: Inject 0.75 mg into the skin every Monday. 05/20/22  Yes Susy Frizzle, MD  bisacodyl (DULCOLAX) 10 MG suppository Place 1 suppository (10 mg total) rectally as needed for moderate constipation. Patient not taking: Reported on 07/19/2022 10/09/20   Eulogio Bear, NP  cephALEXin (KEFLEX) 500 MG capsule Take 1 capsule (500 mg total) by mouth 3 (three) times daily. Patient not taking: Reported on 07/19/2022 06/22/22   Susy Frizzle, MD  furosemide (LASIX) 20 MG tablet TAKE 1 TABLET BY MOUTH EVERY DAY Patient not taking: Reported on 07/19/2022 09/18/20   Susy Frizzle, MD  Glucose Blood (BLOOD GLUCOSE TEST STRIPS) STRP Please dispense as One touch Ultra Blue. Use as directed to monitor FSBS 3x daily. Dx: E11.9. 02/07/19   Susy Frizzle, MD  insulin glargine (LANTUS SOLOSTAR) 100 UNIT/ML Solostar Pen Inject 10 Units into the skin daily. Patient not taking: Reported on 07/19/2022 06/22/22   Susy Frizzle, MD  losartan (COZAAR) 25 MG tablet Take 1 tablet (  25 mg total) by mouth daily. Patient not taking: Reported on 07/19/2022 04/09/22   Susy Frizzle, MD  pioglitazone (ACTOS) 30 MG tablet TAKE 1 TABLET BY MOUTH EVERY DAY Patient not taking: Reported on 07/19/2022 06/24/22   Susy Frizzle, MD      Allergies    Tape and Losartan    Review of Systems   Review of Systems  Constitutional:  Negative for appetite change and fatigue.  HENT:  Negative for congestion, ear discharge and sinus  pressure.   Eyes:  Negative for discharge.  Respiratory:  Negative for cough.   Cardiovascular:  Negative for chest pain.  Gastrointestinal:  Positive for vomiting. Negative for abdominal pain and diarrhea.  Genitourinary:  Negative for frequency and hematuria.  Musculoskeletal:  Negative for back pain.  Skin:  Negative for rash.  Neurological:  Negative for seizures and headaches.  Psychiatric/Behavioral:  Negative for hallucinations.     Physical Exam Updated Vital Signs BP 112/66   Pulse (!) 103   Temp 97.8 F (36.6 C) (Oral)   Resp (!) 22   Ht 5\' 6"  (1.676 m)   Wt 81.6 kg   SpO2 92%   BMI 29.05 kg/m  Physical Exam Vitals and nursing note reviewed.  Constitutional:      Appearance: He is well-developed.  HENT:     Head: Normocephalic.     Nose: Nose normal.  Eyes:     General: No scleral icterus.    Conjunctiva/sclera: Conjunctivae normal.  Neck:     Thyroid: No thyromegaly.  Cardiovascular:     Rate and Rhythm: Normal rate and regular rhythm.     Heart sounds: No murmur heard.    No friction rub. No gallop.  Pulmonary:     Breath sounds: No stridor. No wheezing or rales.  Chest:     Chest wall: No tenderness.  Abdominal:     General: There is no distension.     Tenderness: There is no abdominal tenderness. There is no rebound.  Musculoskeletal:        General: Normal range of motion.     Cervical back: Neck supple.  Lymphadenopathy:     Cervical: No cervical adenopathy.  Skin:    Findings: No erythema or rash.  Neurological:     Mental Status: He is alert and oriented to person, place, and time.     Motor: No abnormal muscle tone.     Coordination: Coordination normal.  Psychiatric:        Behavior: Behavior normal.     ED Results / Procedures / Treatments   Labs (all labs ordered are listed, but only abnormal results are displayed) Labs Reviewed  CBC WITH DIFFERENTIAL/PLATELET - Abnormal; Notable for the following components:      Result Value    WBC 14.9 (*)    RBC 4.08 (*)    Hemoglobin 12.5 (*)    Platelets 127 (*)    Neutro Abs 14.0 (*)    Lymphs Abs 0.2 (*)    All other components within normal limits  BASIC METABOLIC PANEL - Abnormal; Notable for the following components:   CO2 21 (*)    Glucose, Bld 217 (*)    BUN 30 (*)    Creatinine, Ser 1.58 (*)    Calcium 8.6 (*)    GFR, Estimated 42 (*)    All other components within normal limits  URINALYSIS, ROUTINE W REFLEX MICROSCOPIC    EKG None  Radiology No results found.  Procedures Procedures  {Document cardiac monitor, telemetry assessment procedure when appropriate:1}  Medications Ordered in ED Medications  sodium chloride 0.9 % bolus 1,000 mL (has no administration in time range)    ED Course/ Medical Decision Making/ A&P  Patient with more confusion than usual and persistent vomiting.  Urinalysis still pending.  Patient also get a CT of the abdomen disposition will be determined by my colleague Dr. Stark Jock                         Medical Decision Making Amount and/or Complexity of Data Reviewed Labs: ordered. Radiology: ordered.   Vomiting and minimal confusion  {Document critical care time when appropriate:1} {Document review of labs and clinical decision tools ie heart score, Chads2Vasc2 etc:1}  {Document your independent review of radiology images, and any outside records:1} {Document your discussion with family members, caretakers, and with consultants:1} {Document social determinants of health affecting pt's care:1} {Document your decision making why or why not admission, treatments were needed:1} Final Clinical Impression(s) / ED Diagnoses Final diagnoses:  None    Rx / DC Orders ED Discharge Orders     None

## 2022-07-20 ENCOUNTER — Emergency Department (HOSPITAL_COMMUNITY): Payer: PPO

## 2022-07-20 ENCOUNTER — Encounter (HOSPITAL_COMMUNITY): Payer: Self-pay

## 2022-07-20 LAB — HEPATIC FUNCTION PANEL
ALT: 22 U/L (ref 0–44)
AST: 28 U/L (ref 15–41)
Albumin: 3.3 g/dL — ABNORMAL LOW (ref 3.5–5.0)
Alkaline Phosphatase: 75 U/L (ref 38–126)
Bilirubin, Direct: 0.2 mg/dL (ref 0.0–0.2)
Indirect Bilirubin: 0.8 mg/dL (ref 0.3–0.9)
Total Bilirubin: 1 mg/dL (ref 0.3–1.2)
Total Protein: 6.1 g/dL — ABNORMAL LOW (ref 6.5–8.1)

## 2022-07-20 LAB — LIPASE, BLOOD: Lipase: 35 U/L (ref 11–51)

## 2022-07-20 MED ORDER — IOHEXOL 300 MG/ML  SOLN
80.0000 mL | Freq: Once | INTRAMUSCULAR | Status: AC | PRN
  Administered 2022-07-20: 80 mL via INTRAVENOUS

## 2022-07-20 NOTE — ED Provider Notes (Signed)
  Physical Exam  BP 114/68   Pulse 99   Temp 97.8 F (36.6 C) (Oral)   Resp 19   Ht 5\' 6"  (1.676 m)   Wt 81.6 kg   SpO2 94%   BMI 29.05 kg/m   Physical Exam Vitals and nursing note reviewed.  Constitutional:      General: He is not in acute distress.    Appearance: He is well-developed. He is not diaphoretic.  HENT:     Head: Normocephalic and atraumatic.  Cardiovascular:     Rate and Rhythm: Normal rate and regular rhythm.     Heart sounds: No murmur heard.    No friction rub.  Pulmonary:     Effort: Pulmonary effort is normal. No respiratory distress.     Breath sounds: Normal breath sounds. No wheezing or rales.  Abdominal:     General: Bowel sounds are normal. There is no distension.     Palpations: Abdomen is soft.     Tenderness: There is no abdominal tenderness.  Musculoskeletal:        General: Normal range of motion.     Cervical back: Normal range of motion and neck supple.  Skin:    General: Skin is warm and dry.  Neurological:     Mental Status: He is alert and oriented to person, place, and time.     Coordination: Coordination normal.     Procedures  Procedures  ED Course / MDM  Care assumed from Dr. Roderic Palau at shift change.  Patient presenting here with complaints of confusion and vomiting.  Family concerned about the possibility of a urinary tract infection.  Care was signed out to me awaiting results of a CT scan of the abdomen and pelvis, laboratory studies, and urinalysis.  The studies have all returned and are essentially unremarkable.  There is no acute process within the abdomen or pelvis.  He has a mild leukocytosis and symptoms may be related to a viral gastroenteritis or foodborne illness, but nothing today appears emergent.  According to family, patient is back to his baseline and are all comfortable with him returning home.  Patient to be discharged with as needed return.       Veryl Speak, MD 07/20/22 [phone]

## 2022-07-20 NOTE — Discharge Instructions (Addendum)
Continue medications as previously prescribed.  Return to the emergency department if symptoms significantly worsen or change. 

## 2022-08-10 ENCOUNTER — Encounter (INDEPENDENT_AMBULATORY_CARE_PROVIDER_SITE_OTHER): Payer: Self-pay

## 2022-08-22 ENCOUNTER — Other Ambulatory Visit: Payer: Self-pay | Admitting: Family Medicine

## 2022-08-24 ENCOUNTER — Other Ambulatory Visit: Payer: Self-pay | Admitting: Family Medicine

## 2022-09-07 ENCOUNTER — Other Ambulatory Visit: Payer: Self-pay | Admitting: Family Medicine

## 2022-09-21 ENCOUNTER — Other Ambulatory Visit: Payer: Self-pay | Admitting: Family Medicine

## 2022-09-21 MED ORDER — TRULICITY 0.75 MG/0.5ML ~~LOC~~ SOAJ: 0.7500 mg | SUBCUTANEOUS | 0 refills | Status: DC

## 2022-09-22 NOTE — Telephone Encounter (Signed)
Requested medication (s) are due for refill today:   Yes  Requested medication (s) are on the active medication list:   Yes  Future visit scheduled:   No   Last ordered: 09/21/2022 3 ml, 0 refills.  Returned because this is not covered under his insurance.     Requested Prescriptions  Pending Prescriptions Disp Refills   TRULICITY 0.26 VZ/8.5YI SOPN [Pharmacy Med Name: TRULICITY 5.02 DX/4.1 ML PEN]  0    Sig: Inject 0.75 mg into the skin every Monday.     Endocrinology:  Diabetes - GLP-1 Receptor Agonists Failed - 09/21/2022  2:48 PM      Failed - HBA1C is between 0 and 7.9 and within 180 days    Hgb A1c MFr Bld  Date Value Ref Range Status  04/09/2022 8.7 (H) <5.7 % of total Hgb Final    Comment:    For someone without known diabetes, a hemoglobin A1c value of 6.5% or greater indicates that they may have  diabetes and this should be confirmed with a follow-up  test. . For someone with known diabetes, a value <7% indicates  that their diabetes is well controlled and a value  greater than or equal to 7% indicates suboptimal  control. A1c targets should be individualized based on  duration of diabetes, age, comorbid conditions, and  other considerations. . Currently, no consensus exists regarding use of hemoglobin A1c for diagnosis of diabetes for children. .          Failed - Valid encounter within last 6 months    Recent Outpatient Visits           1 year ago Blurry vision, bilateral   [place] [name], [name], MD   1 year ago Constipation, unspecified constipation type   [place] Noemi Chapel A, NP   2 years ago Uncontrolled type 2 diabetes mellitus with hyperosmolarity without coma, with long-term current use of insulin ([place])   [place] Susy Frizzle, MD   2 years ago Type 2 diabetes mellitus with hyperglycemia, without long-term current use of insulin ([place])   [place]  Susy Frizzle, MD   2 years ago Uncontrolled type 2 diabetes mellitus with hyperosmolarity without coma, with long-term current use of insulin ([place])   [facility] [name], [name], MD

## 2022-09-24 ENCOUNTER — Telehealth: Payer: Self-pay | Admitting: Family Medicine

## 2022-09-24 NOTE — Telephone Encounter (Signed)
Bd nano 2 gen pen ndl 32g 4 mm not on current medication list. Routing for review.

## 2022-09-24 NOTE — Telephone Encounter (Signed)
Prescription Request  09/24/2022  Is this a "Controlled Substance" medicine? No  LOV: 06/22/2022  What is the name of the medication or equipment? Bd nano 2 gen pen ndl 32g 4 mm  Have you contacted your pharmacy to request a refill? Yes   Which pharmacy would you like this sent to?  [id number] [address], [place] [address] [place] [address] Phone: [phone] Fax: [phone]    Patient notified that their request is being sent to the clinical staff for review and that they should receive a response within 2 business days.   Please advise at [place]

## 2022-09-25 ENCOUNTER — Telehealth: Payer: Self-pay | Admitting: *Deleted

## 2022-09-25 ENCOUNTER — Other Ambulatory Visit: Payer: Self-pay

## 2022-09-25 DIAGNOSIS — E1100 Type 2 diabetes mellitus with hyperosmolarity without nonketotic hyperglycemic-hyperosmolar coma (NKHHC): Secondary | ICD-10-CM

## 2022-09-25 MED ORDER — BD PEN NEEDLE NANO 2ND GEN 32G X 4 MM MISC: 1 refills | Status: AC

## 2022-09-25 NOTE — Telephone Encounter (Signed)
  Media Information  Document Information  AMB Correspondence  REFILL  09/25/2022 08:33  Attached To:  [place], Provider, MD

## 2022-10-07 ENCOUNTER — Other Ambulatory Visit: Payer: Self-pay | Admitting: Physician Assistant

## 2022-10-08 NOTE — Telephone Encounter (Signed)
Patient needs office visit.  

## 2022-10-13 ENCOUNTER — Ambulatory Visit: Payer: PPO | Admitting: Family Medicine

## 2022-10-13 ENCOUNTER — Telehealth: Payer: Self-pay | Admitting: Family Medicine

## 2022-10-13 MED ORDER — TRULICITY 0.75 MG/0.5ML ~~LOC~~ SOAJ: 0.7500 mg | SUBCUTANEOUS | 0 refills | Status: DC

## 2022-10-13 NOTE — Telephone Encounter (Signed)
Prescription Request  10/13/2022  Is this a "Controlled Substance" medicine? No  LOV: 06/22/2022  What is the name of the medication or equipment?   Dulaglutide (TRULICITY) 3.57 IX/7.8ER SOPN   Have you contacted your pharmacy to request a refill? Yes   Which pharmacy would you like this sent to?  [id number] [address], [place] [address] [place] [address] Phone: [phone] Fax: [phone]    Patient notified that their request is being sent to the clinical staff for review and that they should receive a response within 2 business days.   Please advise pharmacist at [phone].

## 2022-10-13 NOTE — Addendum Note (Signed)
Addended by: Colman Cater on: 10/13/2022 12:51 PM   Modules accepted: Orders

## 2022-10-19 ENCOUNTER — Encounter: Payer: Self-pay | Admitting: Family Medicine

## 2022-10-19 ENCOUNTER — Ambulatory Visit (INDEPENDENT_AMBULATORY_CARE_PROVIDER_SITE_OTHER): Payer: PPO | Admitting: Family Medicine

## 2022-10-19 VITALS — BP 122/70 | HR 62

## 2022-10-19 DIAGNOSIS — E1100 Type 2 diabetes mellitus with hyperosmolarity without nonketotic hyperglycemic-hyperosmolar coma (NKHHC): Secondary | ICD-10-CM

## 2022-10-19 DIAGNOSIS — I6523 Occlusion and stenosis of bilateral carotid arteries: Secondary | ICD-10-CM

## 2022-10-19 DIAGNOSIS — Z794 Long term (current) use of insulin: Secondary | ICD-10-CM | POA: Diagnosis not present

## 2022-10-19 NOTE — Progress Notes (Signed)
Subjective:    Patient ID: [name], male    DOB: 18-Oct-1930, 89 y.o.   MRN: [id number]  Patient is an 89 year-old white male with a history of dementia, chronic kidney disease stage IV, and poorly controlled diabetes mellitus.  His wife dies from pancreatic cancer in November.  Daughter states sugars are 100-200 post prandial.  Denies hypoglycemia.  Denies chest pain, sob, doe, N,V,D, or constipation.  Very HOH.     Past Medical History:  Diagnosis Date   Arthritis    knee   Carotid artery occlusion    Diabetes mellitus    metformin,januvia,and glipizide daily   Enlarged prostate    pt states no trouble with it   History of COVID-19 08/2020   Hyperlipidemia    takes Simvastatin daily   Hypertension    takes Atenolol and Lisinopril daily   Impaired hearing, left    but doesn't wear hearing aids   Seasonal allergies    Past Surgical History:  Procedure Laterality Date   APPENDECTOMY     as a child   CAROTID ANGIOGRAM N/A 05/09/2013   Procedure: CAROTID ANGIOGRAM;  Surgeon: Serafina Mitchell, MD;  Location: [facility] CATH LAB;  Service: Cardiovascular;  Laterality: N/A;   CAROTID ENDARTERECTOMY Right 2008    Re-do Mar 04, 2012   carotidendartectomy  2008   right side   CATARACT EXTRACTION W/PHACO Left 04/08/2021   Procedure: CATARACT EXTRACTION PHACO AND INTRAOCULAR LENS PLACEMENT (IOC) LEFT DIABETIC 15.64 01:28.9;  Surgeon: Birder Robson, MD;  Location: [facility];  Service: Ophthalmology;  Laterality: Left;   CATARACT EXTRACTION W/PHACO Right 04/22/2021   Procedure: CATARACT EXTRACTION PHACO AND INTRAOCULAR LENS PLACEMENT (IOC) RIGHT DIABETIC 12.52 01:16.4;  Surgeon: Birder Robson, MD;  Location: [facility];  Service: Ophthalmology;  Laterality: Right;   ENDARTERECTOMY  03/04/2012   Procedure: ENDARTERECTOMY CAROTID;  Surgeon: Rosetta Posner, MD;  Location: [facility] OR;  Service: Vascular;  Laterality: Right;  Redo Right Carotid Endarterectomy with hemasheild  patch angioplasty   ENDARTERECTOMY Right 06/26/2020   Procedure: REDO CAROTID ENDARTERECTOMY;  Surgeon: Rosetta Posner, MD;  Location: [facility] OR;  Service: Vascular;  Laterality: Right;   WOUND EXPLORATION Right 06/26/2020   Procedure: EXPLORATION OF RIGHT NECK INCISION;  Surgeon: Rosetta Posner, MD;  Location: [facility] OR;  Service: Vascular;  Laterality: Right;   Current Outpatient Medications on File Prior to Visit  Medication Sig Dispense Refill   albuterol (VENTOLIN HFA) 108 (90 Base) MCG/ACT inhaler Inhale 2 puffs into the lungs every 6 (six) hours as needed for wheezing or shortness of breath. 6.7 g 0   ASPIRIN LOW DOSE 81 MG EC tablet TAKE 1 TABLET (81 MG TOTAL) BY MOUTH DAILY. SWALLOW WHOLE. (Patient taking differently: Take 81 mg by mouth in the morning.) 90 tablet 3   atenolol (TENORMIN) 50 MG tablet Take 1 tablet (50 mg total) by mouth daily. 90 tablet 1   dapagliflozin propanediol (FARXIGA) 10 MG TABS tablet Take 1 tablet (10 mg total) by mouth daily before breakfast. 90 tablet 3   docusate sodium ([facility] STOOL SOFTENER) 100 MG capsule Take 100 mg by mouth 3 (three) times a week.     Dulaglutide (TRULICITY) 0.34 VQ/2.5ZD SOPN Inject 0.75 mg into the skin every Monday. 3 mL 0   glipiZIDE (GLUCOTROL XL) 10 MG 24 hr tablet TAKE 1 TABLET BY MOUTH EVERY DAY WITH BREAKFAST (Patient taking differently: Take 10 mg by mouth daily with breakfast.) 90 tablet 2  Glucose Blood (BLOOD GLUCOSE TEST STRIPS) STRP Please dispense as One touch Ultra Blue. Use as directed to monitor FSBS 3x daily. Dx: E11.9. 100 each 4   Insulin Pen Needle (BD PEN NEEDLE NANO 2ND GEN) 32G X 4 MM MISC Use to administer insulin daily. 100 each 1   linaclotide (LINZESS) 145 MCG CAPS capsule Take 1 capsule (145 mcg total) by mouth daily before breakfast. Needs office visit 30 capsule 0   insulin glargine (LANTUS SOLOSTAR) 100 UNIT/ML Solostar Pen Inject 10 Units into the skin daily. (Patient not taking: Reported on 10/19/2022) 15 mL PRN   No  current facility-administered medications on file prior to visit.   Allergies  Allergen Reactions   Tape Other (See Comments)    PLEASE USE EASY-RELEASE TAPE!!!! THE SKIN IS VERY SENSITIVE!!   Losartan Nausea Only and Other (See Comments)    Severe nausea   Social History   Socioeconomic History   Marital status: Married    Spouse name: Nadiene   Number of children: 2   Years of education: Not on file   Highest education level: Not on file  Occupational History   Not on file  Tobacco Use   Smoking status: Former    Years: 25.00    Types: Cigarettes    Quit date: 10/13/1991    Years since quitting: 31.0   Smokeless tobacco: Never  Vaping Use   Vaping Use: Never used  Substance and Sexual Activity   Alcohol use: No    Comment: occassional glass of wine   Drug use: No   Sexual activity: Not Currently  Other Topics Concern   Not on file  Social History Narrative   2 daughters.    Wife is currently undergoing cancer treatments.    Social Determinants of Health   Financial Resource Strain: Low Risk  (11/21/2021)   Overall Financial Resource Strain (CARDIA)    Difficulty of Paying Living Expenses: Not hard at all  Food Insecurity: No Food Insecurity (11/21/2021)   Hunger Vital Sign    Worried About Running Out of Food in the Last Year: Never true    Ran Out of Food in the Last Year: Never true  Transportation Needs: No Transportation Needs (11/21/2021)   PRAPARE - Hydrologist (Medical): No    Lack of Transportation (Non-Medical): No  Physical Activity: Inactive (11/21/2021)   Exercise Vital Sign    Days of Exercise per Week: 0 days    Minutes of Exercise per Session: 0 min  Stress: No Stress Concern Present (11/21/2021)   [place] of Stress : Not at all  Social Connections: [place] (11/21/2021)   Social Connection and Isolation Panel [NHANES]     Frequency of Communication with Friends and Family: More than three times a week    Frequency of Social Gatherings with Friends and Family: More than three times a week    Attends Religious Services: 1 to 4 times per year    Active Member of [employer] or Organizations: Yes    Attends Archivist Meetings: 1 to 4 times per year    Marital Status: Married  Human resources officer Violence: Not At Risk (11/21/2021)   Humiliation, Afraid, Rape, and Kick questionnaire    Fear of Current or Ex-Partner: No    Emotionally Abused: No    Physically Abused: No    Sexually Abused: No  Review of Systems  All other systems reviewed and are negative.      Objective:   Physical Exam Vitals reviewed.  Constitutional:      General: He is not in acute distress.    Appearance: He is well-developed. He is not diaphoretic.  Eyes:     General: Lids are normal.        Right eye: No foreign body.        Left eye: No foreign body.     Extraocular Movements: Extraocular movements intact.     Conjunctiva/sclera:     Right eye: Right conjunctiva is not injected. No chemosis or exudate.    Left eye: Left conjunctiva is not injected. No chemosis or exudate. Neck:     Thyroid: No thyromegaly.     Vascular: No JVD.  Cardiovascular:     Rate and Rhythm: Normal rate and regular rhythm.     Heart sounds: Normal heart sounds. No murmur heard.    No friction rub. No gallop.  Pulmonary:     Effort: Pulmonary effort is normal. No respiratory distress.     Breath sounds: Normal breath sounds. No wheezing or rales.  Abdominal:     General: Bowel sounds are normal. There is no distension.     Palpations: Abdomen is soft. There is no mass.     Tenderness: There is no abdominal tenderness. There is no guarding or rebound.  Musculoskeletal:     Cervical back: Neck supple.  Lymphadenopathy:     Cervical: No cervical adenopathy.           Assessment & Plan:  Bilateral carotid artery stenosis - Plan: US  Carotid Duplex Bilateral  Uncontrolled type 2 diabetes mellitus with hyperosmolarity without coma, with long-term current use of insulin ([place]) - Plan: Hemoglobin A1c, CBC with Differential/Platelet, COMPLETE METABOLIC PANEL WITH GFR Blood pressure acceptable.  Recommended hearing aids.  Flu shot UTD.  Check cbc, cmp, HgA1c.  Goal A1c < * with post parndial sugar goal 100-200.

## 2022-10-20 ENCOUNTER — Other Ambulatory Visit: Payer: Self-pay | Admitting: Family Medicine

## 2022-10-20 ENCOUNTER — Other Ambulatory Visit: Payer: Self-pay

## 2022-10-20 LAB — CBC WITH DIFFERENTIAL/PLATELET
Absolute Monocytes: 700 cells/uL (ref 200–950)
Basophils Absolute: 28 cells/uL (ref 0–200)
Basophils Relative: 0.4 %
Eosinophils Absolute: 112 cells/uL (ref 15–500)
Eosinophils Relative: 1.6 %
HCT: 48.4 % (ref 38.5–50.0)
Hemoglobin: 15.7 g/dL (ref 13.2–17.1)
Lymphs Abs: 2296 cells/uL (ref 850–3900)
MCH: 30.7 pg (ref 27.0–33.0)
MCHC: 32.4 g/dL (ref 32.0–36.0)
MCV: 94.7 fL (ref 80.0–100.0)
MPV: 9.6 fL (ref 7.5–12.5)
Monocytes Relative: 10 %
Neutro Abs: 3864 cells/uL (ref 1500–7800)
Neutrophils Relative %: 55.2 %
Platelets: 182 10*3/uL (ref 140–400)
RBC: 5.11 10*6/uL (ref 4.20–5.80)
RDW: 13.4 % (ref 11.0–15.0)
Total Lymphocyte: 32.8 %
WBC: 7 10*3/uL (ref 3.8–10.8)

## 2022-10-20 LAB — COMPLETE METABOLIC PANEL WITH GFR
AG Ratio: 1.6 (calc) (ref 1.0–2.5)
ALT: 13 U/L (ref 9–46)
AST: 14 U/L (ref 10–35)
Albumin: 4.5 g/dL (ref 3.6–5.1)
Alkaline phosphatase (APISO): 79 U/L (ref 35–144)
BUN/Creatinine Ratio: 25 (calc) — ABNORMAL HIGH (ref 6–22)
BUN: 42 mg/dL — ABNORMAL HIGH (ref 7–25)
CO2: 24 mmol/L (ref 20–32)
Calcium: 9.7 mg/dL (ref 8.6–10.3)
Chloride: 106 mmol/L (ref 98–110)
Creat: 1.66 mg/dL — ABNORMAL HIGH (ref 0.70–1.22)
Globulin: 2.8 g/dL (calc) (ref 1.9–3.7)
Glucose, Bld: 234 mg/dL — ABNORMAL HIGH (ref 65–99)
Potassium: 5.8 mmol/L — ABNORMAL HIGH (ref 3.5–5.3)
Sodium: 139 mmol/L (ref 135–146)
Total Bilirubin: 0.7 mg/dL (ref 0.2–1.2)
Total Protein: 7.3 g/dL (ref 6.1–8.1)
eGFR: 39 mL/min/{1.73_m2} — ABNORMAL LOW (ref >=60)

## 2022-10-20 LAB — HEMOGLOBIN A1C
Hgb A1c MFr Bld: 7.6 % of total Hgb — ABNORMAL HIGH (ref <5.7)
Mean Plasma Glucose: 171 mg/dL
eAG (mmol/L): 9.5 mmol/L

## 2022-10-20 MED ORDER — LOKELMA 10 G PO PACK: 10.0000 g | PACK | Freq: Every day | ORAL | 0 refills | Status: DC

## 2022-10-27 ENCOUNTER — Telehealth: Payer: Self-pay | Admitting: Family Medicine

## 2022-10-27 ENCOUNTER — Other Ambulatory Visit: Payer: Self-pay

## 2022-10-27 DIAGNOSIS — Z79899 Other long term (current) drug therapy: Secondary | ICD-10-CM

## 2022-10-27 NOTE — Telephone Encounter (Signed)
Patient's daughter Maudie Mercury called to request alternate medication for lowering potassium; stated script sent in for sodium zirconium cyclosilicate (LOKELMA) 10 g PACK packet [[id number]]  but pharmacy out of stock/on back order.  Pharmacy confirmed as   [id number] [place], [place] - [address] [place] [address] Phone: [phone]  Fax: [phone] DEA #: [id number]    Please advise Kim when script sent in at [phone].

## 2022-10-28 ENCOUNTER — Encounter: Payer: Self-pay | Admitting: Family Medicine

## 2022-10-28 ENCOUNTER — Other Ambulatory Visit: Payer: Self-pay | Admitting: Family Medicine

## 2022-10-29 ENCOUNTER — Other Ambulatory Visit: Payer: Self-pay

## 2022-10-29 DIAGNOSIS — E875 Hyperkalemia: Secondary | ICD-10-CM

## 2022-10-29 MED ORDER — LOKELMA 10 G PO PACK: 10.0000 g | PACK | Freq: Once | ORAL | 0 refills | Status: AC

## 2022-10-30 ENCOUNTER — Telehealth: Payer: Self-pay

## 2022-10-30 ENCOUNTER — Telehealth: Payer: Self-pay | Admitting: Pharmacist

## 2022-10-30 NOTE — Telephone Encounter (Signed)
Brandy from [facility] called in stating that pt was scheduled to come on 1/22 for a catotid ultrasound. Theadora Rama states that pt has recently had a bypass graft performed by [facility] and will need to schedule his ultrasound with [facility] in [place] where it can be compared to his previous one. Please advise.  If any questions please feel free to contact Andrews from [facility] at [phone]

## 2022-10-30 NOTE — Progress Notes (Signed)
Care Management & Coordination Services Pharmacy Note  10/30/2022 Name:  [name] MRN:  [id number] DOB:  [birth year]  Summary: Initial visit with PharmD.  Patient has some pricing concerns with [place].  Applied for assistance and is approved.  Also with Trulicity - can try to switch to Ozempic if ok with PCP.  He was never able to obtain [facility] - may need to come back in for updated CMP before trying again.  I can get at [facility] if he still needs to take it.  Recommendations/Changes made from today's visit: Cut back on whole milk.  Try switch to Ozempic Recheck CMP for K+   Follow up plan: FU within 30 days on PAP   Subjective: [name] is an 89 y.o. year old male who is a primary patient of [name], [name], MD.  The care coordination team was consulted for assistance with disease management and care coordination needs.    Engaged with patient by telephone for initial visit.  06/22/22 Jenna Luo, MD - Family Medicine - Fever - Labs were ordered. CXR ordered. insulin glargine (LANTUS SOLOSTAR) 100 UNIT/ML Solostar Pen prescribed. Begin Lantus 10 units subcu every morning. Increase Lantus by 1 unit daily until fasting blood sugars are less than 130. Follow up as scheduled.    Recent consult visits:  None noted.   Hospital visits:  Medication Reconciliation was completed by comparing discharge summary, patient's EMR and Pharmacy list, and upon discussion with patient.   Admitted to the hospital on 07/19/22 due to Nausea/Vomiting/Confusion. Discharge date was 07/20/22. Discharged from [place]?Medications Started at [facility] Discharge:?? None noted   Medication Changes at Hospital Discharge: None noted   Medications Discontinued at Hospital Discharge: None noted   Medications that remain the same after Hospital Discharge:??  All other medications will remain the same.     Objective:  Lab Results  Component Value Date    CREATININE 1.66 (H) 10/19/2022   BUN 42 (H) 10/19/2022   EGFR 39 (L) 10/19/2022   GFRNONAA 42 (L) 07/19/2022   GFRAA 33 (L) 06/27/2020   NA 139 10/19/2022   K 5.8 (H) 10/19/2022   CALCIUM 9.7 10/19/2022   CO2 24 10/19/2022   GLUCOSE 234 (H) 10/19/2022    Lab Results  Component Value Date/Time   HGBA1C 7.6 (H) 10/19/2022 12:08 PM   HGBA1C 8.7 (H) 04/09/2022 11:25 AM   MICROALBUR 2.1 03/29/2018 11:54 AM   MICROALBUR 0.7 08/17/2014 04:02 PM    Last diabetic Eye exam:  Lab Results  Component Value Date/Time   HMDIABEYEEXA No Retinopathy 03/05/2021 10:00 AM    Last diabetic Foot exam: No results found for: "HMDIABFOOTEX"   Lab Results  Component Value Date   CHOL 176 04/09/2022   HDL 41 04/09/2022   LDLCALC 107 (H) 04/09/2022   TRIG 168 (H) 04/09/2022   CHOLHDL 4.3 04/09/2022       Latest Ref Rng & Units 10/19/2022   12:08 PM 07/19/2022   11:18 PM 06/22/2022   12:29 PM  Hepatic Function  Total Protein 6.1 - 8.1 g/dL 7.3  6.1  6.8   Albumin 3.5 - 5.0 g/dL  3.3    AST 10 - 35 U/L 14  28  12    ALT 9 - 46 U/L 13  22  21    Alk Phosphatase 38 - 126 U/L  75    Total Bilirubin 0.2 - 1.2 mg/dL 0.7  1.0  0.5   Bilirubin,  Direct 0.0 - 0.2 mg/dL  0.2      Lab Results  Component Value Date/Time   TSH 2.96 03/29/2018 11:54 AM       Latest Ref Rng & Units 10/19/2022   12:08 PM 07/19/2022    9:45 PM 06/22/2022   12:29 PM  CBC  WBC 3.8 - 10.8 Thousand/uL 7.0  14.9  8.9   Hemoglobin 13.2 - 17.1 g/dL 15.7  12.5  15.2   Hematocrit 38.5 - 50.0 % 48.4  39.1  46.8   Platelets 140 - 400 Thousand/uL 182  127  226     No results found for: "VD25OH", "VITAMINB12"  Clinical ASCVD: No  The ASCVD Risk score (Arnett DK, et al., 2019) failed to calculate for the following reasons:   The 2019 ASCVD risk score is only valid for ages 73 to 11        06/22/2022   12:08 PM 11/21/2021   11:20 AM 10/31/2021   11:43 AM  Depression screen PHQ 2/9  Decreased Interest 0 0 0  Down, Depressed,  Hopeless 0 0 0  PHQ - 2 Score 0 0 0     Social History   Tobacco Use  Smoking Status Former   Years: 25.00   Types: Cigarettes   Quit date: 10/13/1991   Years since quitting: 31.0  Smokeless Tobacco Never   BP Readings from Last 3 Encounters:  10/19/22 122/70  07/20/22 119/64  06/22/22 (!) 148/78   Pulse Readings from Last 3 Encounters:  10/19/22 62  07/20/22 (!) 101  06/22/22 (!) 58   Wt Readings from Last 3 Encounters:  07/19/22 180 lb (81.6 kg)  06/22/22 168 lb (76.2 kg)  04/09/22 182 lb (82.6 kg)   BMI Readings from Last 3 Encounters:  07/19/22 29.05 kg/m  06/22/22 28.84 kg/m  04/09/22 31.24 kg/m    Allergies  Allergen Reactions   Tape Other (See Comments)    PLEASE USE EASY-RELEASE TAPE!!!! THE SKIN IS VERY SENSITIVE!!   Losartan Nausea Only and Other (See Comments)    Severe nausea    Medications Reviewed Today     Reviewed by Chriss Driver, LPN (Licensed Practical Nurse) on 10/19/22 at 1155  Med List Status: <None>   Medication Order Taking? Sig Documenting Provider Last Dose Status Informant  albuterol (VENTOLIN HFA) 108 (90 Base) MCG/ACT inhaler [id number] Yes Inhale 2 puffs into the lungs every 6 (six) hours as needed for wheezing or shortness of breath. Thurnell Lose, MD Taking Active Family Member  ASPIRIN LOW DOSE 81 MG EC tablet [id number] Yes TAKE 1 TABLET (81 MG TOTAL) BY MOUTH DAILY. SWALLOW WHOLE.  Patient taking differently: Take 81 mg by mouth in the morning.   Waynetta Sandy, MD Taking Active Family Member  atenolol (TENORMIN) 50 MG tablet [id number] Yes Take 1 tablet (50 mg total) by mouth daily. Susy Frizzle, MD Taking Active Family Member  dapagliflozin propanediol (FARXIGA) 10 MG TABS tablet [id number] Yes Take 1 tablet (10 mg total) by mouth daily before breakfast. Susy Frizzle, MD Taking Active Family Member  docusate sodium ([facility]S STOOL SOFTENER) 100 MG capsule [id number] Yes Take 100 mg by mouth 3 (three)  times a week. [provider] Taking Active Family Member  Dulaglutide (TRULICITY) 7.85 YI/5.0YD SOPN [id number] Yes Inject 0.75 mg into the skin every Monday. Susy Frizzle, MD Taking Active   glipiZIDE (GLUCOTROL XL) 10 MG 24 hr tablet [id number] Yes TAKE 1 TABLET BY MOUTH EVERY DAY  WITH BREAKFAST  Patient taking differently: Take 10 mg by mouth daily with breakfast.   Susy Frizzle, MD Taking Active Family Member  Glucose Blood (BLOOD GLUCOSE TEST STRIPS) STRP [id number] Yes Please dispense as One touch Ultra Blue. Use as directed to monitor FSBS 3x daily. Dx: E11.9. Susy Frizzle, MD Taking Active Family Member  insulin glargine (LANTUS SOLOSTAR) 100 UNIT/ML Solostar Pen [id number] Yes Inject 10 Units into the skin daily. Susy Frizzle, MD Taking Active Family Member  Insulin Pen Needle (BD PEN NEEDLE NANO 2ND GEN) 32G X 4 MM MISC [id number] Yes Use to administer insulin daily. Susy Frizzle, MD Taking Active   linaclotide Rolan Lipa) 145 MCG CAPS capsule [id number] Yes Take 1 capsule (145 mcg total) by mouth daily before breakfast. Needs office visit Levin Erp, PA Taking Active             SDOH:  (Social Determinants of Health) assessments and interventions performed: Yes Financial Resource Strain: Low Risk  (11/21/2021)   Overall Financial Resource Strain (CARDIA)    Difficulty of Paying Living Expenses: Not hard at all   Food Insecurity: No Food Insecurity (11/21/2021)   Hunger Vital Sign    Worried About Running Out of Food in the Last Year: Never true    Ran Out of Food in the Last Year: Never true    SDOH Interventions    Flowsheet Row Clinical Support from 11/21/2021 in [place] from 10/31/2021 in [place] Interventions    Food Insecurity Interventions Intervention Not Indicated Intervention Not Indicated  Housing Interventions Intervention Not Indicated --  Transportation  Interventions Intervention Not Indicated Intervention Not Indicated  Financial Strain Interventions Intervention Not Indicated --  Physical Activity Interventions 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.] --  Stress Interventions Intervention Not Indicated --  Social Connections Interventions Intervention Not Indicated --      SDOH Screenings   Food Insecurity: No Food Insecurity (11/21/2021)  Housing: Low Risk  (11/21/2021)  Transportation Needs: No Transportation Needs (11/21/2021)  Alcohol Screen: Low Risk  (11/21/2021)  Depression (PHQ2-9): Low Risk  (06/22/2022)  Financial Resource Strain: Low Risk  (11/21/2021)  Physical Activity: Inactive (11/21/2021)  Social Connections: Socially Integrated (11/21/2021)  Stress: No Stress Concern Present (11/21/2021)  Tobacco Use: Medium Risk (10/19/2022)    Medication Assistance: Application for [employer]  medication assistance program. in process.  Anticipated assistance start date 1 week.  See plan of care for additional detail.  Medication Access: Within the past 30 days, how often has patient missed a dose of medication? 0 Is a pillbox or other method used to improve adherence? Yes  Factors that may affect medication adherence? financial need Are meds synced by current pharmacy? No  Are meds delivered by current pharmacy? No  Does patient experience delays in picking up medications due to transportation concerns? No   [facility] Reviewed: Is patient disadvantaged to use [facility]?: Yes  Current Rx insurance plan: HTA Name and location of Current pharmacy:  [id number] [place], [place] - [address] [place] [address] Phone: [phone] Fax: [phone]  [id number] [address], [place] [address] Phone: [phone] Fax: [phone]  [facility] services reviewed with patient  today?: Yes  Patient requests to transfer care to [facility]?: No  Reason patient declined  to change pharmacies: Disadvantaged due to insurance/mail order  Compliance/Adherence/Medication fill history: Care Gaps: Annual wellness visit in last year? Yes 11/21/21   Star-Rating Drugs: Star Rating Drugs:  Medication:                                        Last Fill:         Day Supply glipiZIDE10 MG 24 hr tablet               10/29/22            90 dulaglutide 0.75 MG/0.5ML SOPN     10/13/22              28 dapagliflozin propanediol 10 MG        10/16/22              30   Assessment/Plan   Hypertension (BP goal <140/90) -Controlled -Current treatment: Atenolol 50mg  Appropriate, Effective, Safe, Accessible -Medications previously tried: none noted  -Current home readings: not checking at home lately -Current dietary habits: see DM -Current exercise habits: does some walking when the weather is warm -Denies hypotensive/hypertensive symptoms -Educated on BP goals and benefits of medications for prevention of heart attack, stroke and kidney damage; Importance of home blood pressure monitoring; Symptoms of hypotension and importance of maintaining adequate hydration; -Counseled to monitor BP at home once weekly if able, document, and provide log at future appointments -Recommended to continue current medication  Diabetes (A1c goal <8%) -Controlled, most recent A1c was 7.6% -Current medications: Farxiga 10mg  Appropriate, Effective, Safe, Accessible Lantus 10 units daily (Backup) Appropriate, Effective, Safe, Accessible Glipizide XL 10mg  Appropriate, Effective, Query Safe,  Trulicity 0.75mg  once weekly Appropriate, Effective, Safe, Query accessible -Medications previously tried: none noted  -Current home glucose readings fasting glucose: does not usually check, his daughter checks and he has already eaten by the time she gets there post prandial glucose: 212 - after breakfast,  before meds -Denies hypoglycemic/hyperglycemic symptoms -Current meal patterns:  breakfast: eggs, tomatoes, bread  lunch: soups, sandwich  dinner: home cooked meal, meat potatoes, veggies snacks:  drinks: no coffee,  big glasses of milk (whole milk), water -Current exercise: some walking during warmer seasons -Educated on A1c and blood sugar goals; Complications of diabetes including kidney damage, retinal damage, and cardiovascular disease; Exercise goal of 150 minutes per week; -Counseled to check feet daily and get yearly eye exams - Take Lantus as a backup when he cannot get Trulicity.  Applied for [place] assistance today and approved.  New rx sent to [facility].  Will also try to get approved for Ozempic to replace Trulicity.  Counseled on less strict glucose goals, avoiding hypo with glipizide.  Drinks lots of milk so could cut that in half and reduce sugar/carbs.  Constipation (Goal: Reduce symptoms) -Controlled -Current treatment  Linzess 145 mcg daily Appropriate, Effective, Safe, Accessible -Medications previously tried: none noted  -Recommended to continue current medication Constipation controlled - sometimes expensive but medication is working.  Beverly Milch, PharmD, CPP Clinical Pharmacist Practitioner [place] [phone]

## 2022-10-30 NOTE — Progress Notes (Signed)
Care Management & Coordination Services Pharmacy Team  Reason for Encounter: Appointment Reminder  Contacted patient on 10/30/2022   Recent office visits:  10/19/22 Jenna Luo, MD - [place] were ordered. US Carotid ordered. Follow up as scheduled.   06/22/22 Jenna Luo, MD - Family Medicine - Fever - Labs were ordered. CXR ordered. insulin glargine (LANTUS SOLOSTAR) 100 UNIT/ML Solostar Pen prescribed. Begin Lantus 10 units subcu every morning. Increase Lantus by 1 unit daily until fasting blood sugars are less than 130. Follow up as scheduled.   Recent consult visits:  None noted.  Hospital visits:  Medication Reconciliation was completed by comparing discharge summary, patient's EMR and Pharmacy list, and upon discussion with patient.  Admitted to the hospital on 07/19/22 due to Nausea/Vomiting/Confusion. Discharge date was 07/20/22. Discharged from [place]?Medications Started at [facility] Discharge:?? None noted  Medication Changes at Hospital Discharge: None noted  Medications Discontinued at Hospital Discharge: None noted  Medications that remain the same after Hospital Discharge:??  All other medications will remain the same.     Medications: Outpatient Encounter Medications as of 10/30/2022  Medication Sig   albuterol (VENTOLIN HFA) 108 (90 Base) MCG/ACT inhaler Inhale 2 puffs into the lungs every 6 (six) hours as needed for wheezing or shortness of breath.   ASPIRIN LOW DOSE 81 MG EC tablet TAKE 1 TABLET (81 MG TOTAL) BY MOUTH DAILY. SWALLOW WHOLE. (Patient taking differently: Take 81 mg by mouth in the morning.)   atenolol (TENORMIN) 50 MG tablet Take 1 tablet (50 mg total) by mouth daily.   dapagliflozin propanediol (FARXIGA) 10 MG TABS tablet Take 1 tablet (10 mg total) by mouth daily before breakfast.   docusate sodium ([facility]S STOOL SOFTENER) 100 MG capsule Take 100 mg by mouth 3 (three) times a week.   Dulaglutide (TRULICITY) 9.21  JH/4.1DE SOPN Inject 0.75 mg into the skin every Monday.   glipiZIDE (GLUCOTROL XL) 10 MG 24 hr tablet TAKE 1 TABLET BY MOUTH EVERY DAY WITH BREAKFAST (Patient taking differently: Take 10 mg by mouth daily with breakfast.)   Glucose Blood (BLOOD GLUCOSE TEST STRIPS) STRP Please dispense as One touch Ultra Blue. Use as directed to monitor FSBS 3x daily. Dx: E11.9.   insulin glargine (LANTUS SOLOSTAR) 100 UNIT/ML Solostar Pen Inject 10 Units into the skin daily. (Patient not taking: Reported on 10/19/2022)   Insulin Pen Needle (BD PEN NEEDLE NANO 2ND GEN) 32G X 4 MM MISC Use to administer insulin daily.   linaclotide (LINZESS) 145 MCG CAPS capsule Take 1 capsule (145 mcg total) by mouth daily before breakfast. Needs office visit   No facility-administered encounter medications on file as of 10/30/2022.   Lab Results  Component Value Date/Time   HGBA1C 7.6 (H) 10/19/2022 12:08 PM   HGBA1C 8.7 (H) 04/09/2022 11:25 AM   MICROALBUR 2.1 03/29/2018 11:54 AM   MICROALBUR 0.7 08/17/2014 04:02 PM    BP Readings from Last 3 Encounters:  10/19/22 122/70  07/20/22 119/64  06/22/22 (!) 148/78     Patient contacted to confirm telephone appointment with Leata Mouse PharmD, on 11/04/22 at 10:00AM.  Do you have any problems getting your medications? No If yes what types of problems are you experiencing?   What is your top health concern you would like to discuss at your upcoming visit?   Have you seen any other providers since your last visit with PCP? No    Star Rating Drugs:  Medication:  Last Fill: Day Supply glipiZIDE10 MG 24 hr tablet   10/29/22 90 dulaglutide 0.75 MG/0.5ML SOPN 10/13/22  28 dapagliflozin propanediol 10 MG 10/16/22  30    Care Gaps: Annual wellness visit in last year? Yes 11/21/21  If Diabetic: Last eye exam / retinopathy screening: unknown Last diabetic foot exam: 04/09/22   Future Appointments  Date Time Provider [place]  11/02/2022  8:00 AM [facility] [place] 4  [id number] [facility] [address] [facility]  11/04/2022 10:00 AM Beverly Milch L, RPH [facility] None  11/27/2022 11:15 AM [facility]-NURSE HEALTH ADVISOR [facility]

## 2022-11-02 ENCOUNTER — Other Ambulatory Visit: Payer: Self-pay | Admitting: Family Medicine

## 2022-11-02 ENCOUNTER — Inpatient Hospital Stay: Admission: RE | Admit: 2022-11-02 | Payer: PPO | Source: Ambulatory Visit

## 2022-11-02 DIAGNOSIS — I6523 Occlusion and stenosis of bilateral carotid arteries: Secondary | ICD-10-CM

## 2022-11-04 ENCOUNTER — Encounter: Payer: Self-pay | Admitting: Family Medicine

## 2022-11-04 ENCOUNTER — Ambulatory Visit: Payer: PPO | Admitting: Pharmacist

## 2022-11-04 DIAGNOSIS — E118 Type 2 diabetes mellitus with unspecified complications: Secondary | ICD-10-CM

## 2022-11-04 DIAGNOSIS — I10 Essential (primary) hypertension: Secondary | ICD-10-CM

## 2022-11-04 MED ORDER — DAPAGLIFLOZIN PROPANEDIOL 10 MG PO TABS: 10.0000 mg | ORAL_TABLET | Freq: Every day | ORAL | 3 refills | Status: DC

## 2022-11-05 ENCOUNTER — Telehealth: Payer: Self-pay | Admitting: Pharmacist

## 2022-11-05 NOTE — Addendum Note (Signed)
Addended by: Edythe Clarity on: 11/05/2022 03:48 PM   Modules accepted: Orders

## 2022-11-05 NOTE — Telephone Encounter (Signed)
Patients daughter notified to bring him in for CMP.  Order placed.  Also left him paperwork for Ozempic up front in the folder.  Patient to complete and return.  Beverly Milch, PharmD, CPP Clinical Pharmacist Practitioner [place] [phone]

## 2022-11-06 ENCOUNTER — Other Ambulatory Visit: Payer: Self-pay

## 2022-11-06 DIAGNOSIS — I6523 Occlusion and stenosis of bilateral carotid arteries: Secondary | ICD-10-CM

## 2022-11-11 ENCOUNTER — Other Ambulatory Visit: Payer: Self-pay

## 2022-11-11 DIAGNOSIS — E1100 Type 2 diabetes mellitus with hyperosmolarity without nonketotic hyperglycemic-hyperosmolar coma (NKHHC): Secondary | ICD-10-CM

## 2022-11-11 DIAGNOSIS — N1832 Chronic kidney disease, stage 3b: Secondary | ICD-10-CM

## 2022-11-11 DIAGNOSIS — E1165 Type 2 diabetes mellitus with hyperglycemia: Secondary | ICD-10-CM

## 2022-11-11 DIAGNOSIS — I2510 Atherosclerotic heart disease of native coronary artery without angina pectoris: Secondary | ICD-10-CM

## 2022-11-13 ENCOUNTER — Other Ambulatory Visit: Payer: Self-pay | Admitting: Family Medicine

## 2022-11-13 NOTE — Telephone Encounter (Signed)
Requested Prescriptions  Pending Prescriptions Disp Refills   TRULICITY 1.84 CR/7.5OH SOPN [Pharmacy Med Name: TRULICITY 6.06 VP/0.3 ML PEN] 3 mL 0    Sig: INJECT 0.75 MG INTO THE SKIN EVERY MONDAY.     Endocrinology:  Diabetes - GLP-1 Receptor Agonists Failed - 11/13/2022  1:34 AM      Failed - Valid encounter within last 6 months    Recent Outpatient Visits           1 year ago Blurry vision, bilateral   [place] [name], [name], MD   2 years ago Constipation, unspecified constipation type   [place] Eulogio Bear, NP   2 years ago Uncontrolled type 2 diabetes mellitus with hyperosmolarity without coma, with long-term current use of insulin ([place])   [place] Susy Frizzle, MD   2 years ago Type 2 diabetes mellitus with hyperglycemia, without long-term current use of insulin ([place])   [place] [name], [name], MD   2 years ago Uncontrolled type 2 diabetes mellitus with hyperosmolarity without coma, with long-term current use of insulin ([place])   [facility] [name], [name], MD              Passed - HBA1C is between 0 and 7.9 and within 180 days    Hgb A1c MFr Bld  Date Value Ref Range Status  10/19/2022 7.6 (H) <5.7 % of total Hgb Final    Comment:    For someone without known diabetes, a hemoglobin A1c value of 6.5% or greater indicates that they may have  diabetes and this should be confirmed with a follow-up  test. . For someone with known diabetes, a value <7% indicates  that their diabetes is well controlled and a value  greater than or equal to 7% indicates suboptimal  control. A1c targets should be individualized based on  duration of diabetes, age, comorbid conditions, and  other considerations. . Currently, no consensus exists regarding use of hemoglobin A1c for diagnosis of diabetes for children. [name] Kitchen

## 2022-11-19 ENCOUNTER — Other Ambulatory Visit: Payer: Self-pay | Admitting: *Deleted

## 2022-11-19 ENCOUNTER — Other Ambulatory Visit: Payer: Self-pay | Admitting: Physician Assistant

## 2022-11-19 DIAGNOSIS — I6523 Occlusion and stenosis of bilateral carotid arteries: Secondary | ICD-10-CM

## 2022-11-27 ENCOUNTER — Telehealth: Payer: Self-pay | Admitting: Physician Assistant

## 2022-11-27 ENCOUNTER — Telehealth: Payer: Self-pay

## 2022-11-27 ENCOUNTER — Other Ambulatory Visit: Payer: Self-pay | Admitting: Vascular Surgery

## 2022-11-27 MED ORDER — LINACLOTIDE 145 MCG PO CAPS: 145.0000 ug | ORAL_CAPSULE | Freq: Every day | ORAL | 1 refills | Status: DC

## 2022-11-27 NOTE — Telephone Encounter (Signed)
Script sent to pharmacy.

## 2022-11-27 NOTE — Telephone Encounter (Signed)
Inbound call from patients daughter stating patient needs a refill for Linzess. Patient was scheduled for OV with Ellouise Newer on 3/19 at 9:00 and is requesting a refill to be sent. Please advise.

## 2022-11-27 NOTE — Telephone Encounter (Signed)
Reschedule for in person visit from 11/27/2022

## 2022-12-08 ENCOUNTER — Ambulatory Visit (HOSPITAL_COMMUNITY)
Admission: RE | Admit: 2022-12-08 | Discharge: 2022-12-08 | Disposition: A | Discharge status: Home / Self Care | Payer: PPO | Source: Ambulatory Visit | Attending: Vascular Surgery | Admitting: Vascular Surgery

## 2022-12-08 ENCOUNTER — Ambulatory Visit (INDEPENDENT_AMBULATORY_CARE_PROVIDER_SITE_OTHER): Payer: PPO | Admitting: Vascular Surgery

## 2022-12-08 VITALS — BP 160/86 | HR 72 | Temp 98.2°F | Resp 20 | Ht 66.0 in | Wt 171.0 lb

## 2022-12-08 DIAGNOSIS — I6523 Occlusion and stenosis of bilateral carotid arteries: Secondary | ICD-10-CM | POA: Diagnosis not present

## 2022-12-08 NOTE — Progress Notes (Signed)
[facility] [place]  ASSESSMENT / PLAN: [name] is a 89 y.o. male with asymptomatic left 66 - 79 % carotid artery stenosis. History of right common-internal carotid artery bypass with GSV.  The patient should continue best medical therapy for carotid artery stenosis including: Complete cessation from all tobacco products. Blood glucose control with goal A1c < 7%. Blood pressure control with goal blood pressure < 140/90 mmHg. Lipid reduction therapy with goal LDL-C <100 mg/dL (<70 if symptomatic from carotid artery stenosis).  Aspirin '81mg'$  PO QD.  Atorvastatin 40-'80mg'$  PO QD (or other "high intensity" statin therapy).  He is at the threshold for asymptomatic intervention. I think this is relatively contraindicated because of his advanced age and frailty. Follow up with me in 1 year.   CHIEF COMPLAINT: carotid stenosis  HISTORY OF PRESENT ILLNESS: [name] is a 89 y.o. male well known to our service who returns to discuss left carotid artery stenosis. He has a strong history of cerebrovascular disease (see below). His primary care physician has been monitoring his carotid stenosis and identified progression of left carotid artery stenosis. He has had multiple interventions on his right carotid artery.   12/08/22: patient returns to clinic. No symptoms from carotid stadnpoint. He is not interested in any surgical intervention. I reviewed his duplex findings today. I counseled him and his daughter about the relatively mild absolute risk reduction for surgery (6%). I recommended watchful waiting. They are in agreement.   VASCULAR SURGICAL HISTORY:  Right carotid endarterectomy Right re-do carotid endarterectomy 03/04/12 Carotid and cerebral angiogram 05/09/13 Right common carotid to internal carotid artery bypass with greater saphenous vein 06/26/20 Past Medical History:  Diagnosis Date   Arthritis    knee   Carotid artery occlusion    Diabetes  mellitus    metformin,januvia,and glipizide daily   Enlarged prostate    pt states no trouble with it   History of COVID-19 08/2020   Hyperlipidemia    takes Simvastatin daily   Hypertension    takes Atenolol and Lisinopril daily   Impaired hearing, left    but doesn't wear hearing aids   Seasonal allergies     Past Surgical History:  Procedure Laterality Date   APPENDECTOMY     as a child   CAROTID ANGIOGRAM N/A 05/09/2013   Procedure: CAROTID ANGIOGRAM;  Surgeon: Serafina Mitchell, MD;  Location: [facility] CATH LAB;  Service: Cardiovascular;  Laterality: N/A;   CAROTID ENDARTERECTOMY Right 2008    Re-do Mar 04, 2012   carotidendartectomy  2008   right side   CATARACT EXTRACTION W/PHACO Left 04/08/2021   Procedure: CATARACT EXTRACTION PHACO AND INTRAOCULAR LENS PLACEMENT (IOC) LEFT DIABETIC 15.64 01:28.9;  Surgeon: Birder Robson, MD;  Location: [place];  Service: Ophthalmology;  Laterality: Left;   CATARACT EXTRACTION W/PHACO Right 04/22/2021   Procedure: CATARACT EXTRACTION PHACO AND INTRAOCULAR LENS PLACEMENT (IOC) RIGHT DIABETIC 12.52 01:16.4;  Surgeon: Birder Robson, MD;  Location: [place];  Service: Ophthalmology;  Laterality: Right;   ENDARTERECTOMY  03/04/2012   Procedure: ENDARTERECTOMY CAROTID;  Surgeon: Rosetta Posner, MD;  Location: [facility] OR;  Service: Vascular;  Laterality: Right;  Redo Right Carotid Endarterectomy with hemasheild patch angioplasty   ENDARTERECTOMY Right 06/26/2020   Procedure: REDO CAROTID ENDARTERECTOMY;  Surgeon: Rosetta Posner, MD;  Location: [place];  Service: Vascular;  Laterality: Right;   WOUND EXPLORATION Right 06/26/2020   Procedure: EXPLORATION OF RIGHT NECK INCISION;  Surgeon: Rosetta Posner,  MD;  Location: [facility] OR;  Service: Vascular;  Laterality: Right;    Family History  Problem Relation Age of Onset   Leukemia Mother    Asthma Father    Anesthesia problems Neg Hx    Hypotension Neg Hx    Malignant hyperthermia Neg Hx     Pseudochol deficiency Neg Hx    Colon cancer Neg Hx    Stomach cancer Neg Hx    Esophageal cancer Neg Hx    Pancreatic cancer Neg Hx     Social History   Socioeconomic History   Marital status: Married    Spouse name: Nadiene   Number of children: 2   Years of education: Not on file   Highest education level: Not on file  Occupational History   Not on file  Tobacco Use   Smoking status: Former    Years: 25.00    Types: Cigarettes    Quit date: 10/13/1991    Years since quitting: 31.1   Smokeless tobacco: Never  Vaping Use   Vaping Use: Never used  Substance and Sexual Activity   Alcohol use: No    Comment: occassional glass of wine   Drug use: No   Sexual activity: Not Currently  Other Topics Concern   Not on file  Social History Narrative   2 daughters.    Wife is currently undergoing cancer treatments.    Social Determinants of Health   Financial Resource Strain: Low Risk  (11/21/2021)   Overall Financial Resource Strain (CARDIA)    Difficulty of Paying Living Expenses: Not hard at all  Food Insecurity: No Food Insecurity (11/21/2021)   Hunger Vital Sign    Worried About Running Out of Food in the Last Year: Never true    Ran Out of Food in the Last Year: Never true  Transportation Needs: No Transportation Needs (11/21/2021)   PRAPARE - Hydrologist (Medical): No    Lack of Transportation (Non-Medical): No  Physical Activity: Inactive (11/21/2021)   Exercise Vital Sign    Days of Exercise per Week: 0 days    Minutes of Exercise per Session: 0 min  Stress: No Stress Concern Present (11/21/2021)   [place] of Stress : Not at all  Social Connections: [place] (11/21/2021)   Social Connection and Isolation Panel [NHANES]    Frequency of Communication with Friends and Family: More than three times a week    Frequency of Social Gatherings with Friends  and Family: More than three times a week    Attends Religious Services: 1 to 4 times per year    Active Member of [employer] or Organizations: Yes    Attends Archivist Meetings: 1 to 4 times per year    Marital Status: Married  Human resources officer Violence: Not At Risk (11/21/2021)   Humiliation, Afraid, Rape, and Kick questionnaire    Fear of Current or Ex-Partner: No    Emotionally Abused: No    Physically Abused: No    Sexually Abused: No    Allergies  Allergen Reactions   Tape Other (See Comments)    PLEASE USE EASY-RELEASE TAPE!!!! THE SKIN IS VERY SENSITIVE!!   Losartan Nausea Only and Other (See Comments)    Severe nausea    Current Outpatient Medications  Medication Sig Dispense Refill   albuterol (VENTOLIN HFA) 108 (90 Base) MCG/ACT inhaler Inhale 2 puffs into the lungs every 6 (  six) hours as needed for wheezing or shortness of breath. 6.7 g 0   ASPIRIN LOW DOSE 81 MG tablet TAKE 1 TABLET BY MOUTH DAILY. SWALLOW WHOLE. 90 tablet 3   atenolol (TENORMIN) 50 MG tablet Take 1 tablet (50 mg total) by mouth daily. 90 tablet 1   dapagliflozin propanediol (FARXIGA) 10 MG TABS tablet Take 1 tablet (10 mg total) by mouth daily before breakfast. 90 tablet 3   docusate sodium ([employer] STOOL SOFTENER) 100 MG capsule Take 100 mg by mouth 3 (three) times a week.     Dulaglutide (TRULICITY) A999333 0000000 SOPN INJECT 0.75 MG INTO THE SKIN EVERY MONDAY. 3 mL 0   glipiZIDE (GLUCOTROL XL) 10 MG 24 hr tablet TAKE 1 TABLET BY MOUTH EVERY DAY WITH BREAKFAST (Patient taking differently: Take 10 mg by mouth daily with breakfast.) 90 tablet 2   Glucose Blood (BLOOD GLUCOSE TEST STRIPS) STRP Please dispense as One touch Ultra Blue. Use as directed to monitor FSBS 3x daily. Dx: E11.9. 100 each 4   insulin glargine (LANTUS SOLOSTAR) 100 UNIT/ML Solostar Pen Inject 10 Units into the skin daily. 15 mL PRN   Insulin Pen Needle (BD PEN NEEDLE NANO 2ND GEN) 32G X 4 MM MISC Use to administer insulin daily. 100  each 1   linaclotide (LINZESS) 145 MCG CAPS capsule Take 1 capsule (145 mcg total) by mouth daily before breakfast. Needs office visit 30 capsule 1   No current facility-administered medications for this visit.    REVIEW OF SYSTEMS:  '[X]'$  denotes positive finding, '[ ]'$  denotes negative finding Cardiac  Comments:  Chest pain or chest pressure:    Shortness of breath upon exertion:    Short of breath when lying flat:    Irregular heart rhythm:        Vascular    Pain in calf, thigh, or hip brought on by ambulation:    Pain in feet at night that wakes you up from your sleep:     Blood clot in your veins:    Leg swelling:         Pulmonary    Oxygen at home:    Productive cough:     Wheezing:         Neurologic    Sudden weakness in arms or legs:     Sudden numbness in arms or legs:     Sudden onset of difficulty speaking or slurred speech:    Temporary loss of vision in one eye:     Problems with dizziness:         Gastrointestinal    Blood in stool:     Vomited blood:         Genitourinary    Burning when urinating:     Blood in urine:        Psychiatric    Major depression:         Hematologic    Bleeding problems:    Problems with blood clotting too easily:        Skin    Rashes or ulcers:        Constitutional    Fever or chills:      PHYSICAL EXAM Vitals:   12/08/22 1402  BP: (!) 160/86  Pulse: 72  Resp: 20  Temp: 98.2 F (36.8 C)  SpO2: 96%  Weight: 171 lb (77.6 kg)  Height: '5\' 6"'$  (1.676 m)    Constitutional: elderly. No distress. Appears well nourished.  Neurologic: CN intact. Hard  of hearing. No focal findings. No sensory loss. Psychiatric:  Mood and affect symmetric and appropriate. Eyes:  No icterus. No conjunctival pallor. Ears, nose, throat:  mucous membranes moist. Midline trachea.  Cardiac: regular rate and rhythm.  Respiratory:  unlabored. Abdominal:  soft, non-tender, non-distended.  Extremity: no edema. no cyanosis. no pallor.   Skin: no gangrene. no ulceration.  Lymphatic: no Stemmer's sign. no palpable lymphadenopathy.  PERTINENT LABORATORY AND RADIOLOGIC DATA  Most recent CBC    Latest Ref Rng & Units 10/19/2022   12:08 PM 07/19/2022    9:45 PM 06/22/2022   12:29 PM  CBC  WBC 3.8 - 10.8 Thousand/uL 7.0  14.9  8.9   Hemoglobin 13.2 - 17.1 g/dL 15.7  12.5  15.2   Hematocrit 38.5 - 50.0 % 48.4  39.1  46.8   Platelets 140 - 400 Thousand/uL 182  127  226      Most recent CMP    Latest Ref Rng & Units 10/19/2022   12:08 PM 07/19/2022   11:18 PM 07/19/2022    9:45 PM  CMP  Glucose 65 - 99 mg/dL 234   217   BUN 7 - 25 mg/dL 42   30   Creatinine 0.70 - 1.22 mg/dL 1.66   1.58   Sodium 135 - 146 mmol/L 139   137   Potassium 3.5 - 5.3 mmol/L 5.8   3.9   Chloride 98 - 110 mmol/L 106   108   CO2 20 - 32 mmol/L 24   21   Calcium 8.6 - 10.3 mg/dL 9.7   8.6   Total Protein 6.1 - 8.1 g/dL 7.3  6.1    Total Bilirubin 0.2 - 1.2 mg/dL 0.7  1.0    Alkaline Phos 38 - 126 U/L  75    AST 10 - 35 U/L 14  28    ALT 9 - 46 U/L 13  22      Renal function CrCl cannot be calculated (Patient's most recent lab result is older than the maximum 21 days allowed.).  Hgb A1c MFr Bld (% of total Hgb)  Date Value  10/19/2022 7.6 (H)    LDL Cholesterol (Calc)  Date Value Ref Range Status  04/09/2022 107 (H) mg/dL (calc) Final    Comment:    Reference range: <100 . Desirable range <100 mg/dL for primary prevention;   <70 mg/dL for patients with CHD or diabetic patients  with > or = 2 CHD risk factors. [name] Kitchen LDL-C is now calculated using the Martin-Hopkins  calculation, which is a validated novel method providing  better accuracy than the Friedewald equation in the  estimation of LDL-C.  Cresenciano Genre et al. Annamaria Helling. [id number]): [id number]  (http://education.[facility].com/faq/FAQ164)      Vascular Imaging: Right Carotid: Patent right CCA to ICA bypass graft without evidence of                 stenosis.   Left Carotid:  Velocities in the left ICA are consistent with a 80-99%  stenosis.   Vertebrals: Bilateral vertebral arteries demonstrate antegrade flow.  Subclavians: Right subclavian artery flow was disturbed. Normal flow               hemodynamics were seen in the left subclavian artery.   Yevonne Aline. Stanford Breed, MD [facility] Phone Number: [phone] 12/08/2022 3:39 PM  Total time spent on preparing this encounter including chart review, data review, collecting history, examining the patient, coordinating  care for this established patient, 40 minutes.  Portions of this report may have been transcribed using voice recognition software.  Every effort has been made to ensure accuracy; however, inadvertent computerized transcription errors may still be present.

## 2022-12-11 ENCOUNTER — Ambulatory Visit (INDEPENDENT_AMBULATORY_CARE_PROVIDER_SITE_OTHER): Payer: PPO

## 2022-12-11 ENCOUNTER — Other Ambulatory Visit: Payer: PPO

## 2022-12-11 VITALS — BP 160/80 | HR 78 | Temp 97.6°F | Ht 66.0 in | Wt 174.0 lb

## 2022-12-11 DIAGNOSIS — E875 Hyperkalemia: Secondary | ICD-10-CM | POA: Diagnosis not present

## 2022-12-11 DIAGNOSIS — Z0001 Encounter for general adult medical examination with abnormal findings: Secondary | ICD-10-CM

## 2022-12-11 DIAGNOSIS — Z0000 Encounter for general adult medical examination without abnormal findings: Secondary | ICD-10-CM

## 2022-12-11 NOTE — Progress Notes (Signed)
Subjective:   Brewster Kuzara is a 89 y.o. male who presents for Medicare Annual/Subsequent preventive examination.  Review of Systems     Cardiac Risk Factors include: advanced age (>53mn, >>3women);diabetes mellitus;obesity (BMI >30kg/m2);sedentary lifestyle;male gender     Objective:    Today's Vitals   12/11/22 1506  BP: (!) 160/80  Pulse: 78  Temp: 97.6 F (36.4 C)  TempSrc: Oral  SpO2: 98%  Weight: 174 lb (78.9 kg)  Height: '5\' 6"'$  (1.676 m)   Body mass index is 28.08 kg/m.     12/11/2022    3:20 PM 07/19/2022    8:53 PM 11/21/2021   11:23 AM 10/31/2021   11:43 AM 04/22/2021    6:52 AM 04/08/2021    7:31 AM 06/19/2020    9:53 AM  Advanced Directives  Does Patient Have a Medical Advance Directive? Yes No Yes Yes Yes Yes No  Type of AParamedicof AStanleyLiving will Living will;Healthcare Power of [place]Arroyo SecoLiving will   Does patient want to make changes to medical advance directive?    No - Patient declined No - Patient declined No - Patient declined   Copy of [place] Chart?   Yes - validated most recent copy scanned in chart (See row information) No - copy requested No - copy requested Yes - validated most recent copy scanned in chart (See row information)   Would patient like information on creating a medical advance directive?  No - Patient declined     No - Patient declined    Current Medications (verified) Outpatient Encounter Medications as of 12/11/2022  Medication Sig   albuterol (VENTOLIN HFA) 108 (90 Base) MCG/ACT inhaler Inhale 2 puffs into the lungs every 6 (six) hours as needed for wheezing or shortness of breath.   ASPIRIN LOW DOSE 81 MG tablet TAKE 1 TABLET BY MOUTH DAILY. SWALLOW WHOLE.   atenolol (TENORMIN) 50 MG tablet Take 1 tablet (50 mg total) by mouth daily.   dapagliflozin propanediol (FARXIGA) 10 MG TABS  tablet Take 1 tablet (10 mg total) by mouth daily before breakfast.   docusate sodium (CVS STOOL SOFTENER) 100 MG capsule Take 100 mg by mouth 3 (three) times a week.   Dulaglutide (TRULICITY) [id number] INJECT 0.75 MG INTO THE SKIN EVERY MONDAY.   glipiZIDE (GLUCOTROL XL) 10 MG 24 hr tablet TAKE 1 TABLET BY MOUTH EVERY DAY WITH BREAKFAST (Patient taking differently: Take 10 mg by mouth daily with breakfast.)   Glucose Blood (BLOOD GLUCOSE TEST STRIPS) STRP Please dispense as One touch Ultra Blue. Use as directed to monitor FSBS 3x daily. Dx: E11.9.   insulin glargine (LANTUS SOLOSTAR) 100 UNIT/ML Solostar Pen Inject 10 Units into the skin daily.   Insulin Pen Needle (BD PEN NEEDLE NANO 2ND GEN) 32G X 4 MM MISC Use to administer insulin daily.   linaclotide (LINZESS) 145 MCG CAPS capsule Take 1 capsule (145 mcg total) by mouth daily before breakfast. Needs office visit   No facility-administered encounter medications on file as of 12/11/2022.    Allergies (verified) Tape and Losartan   History: Past Medical History:  Diagnosis Date   Arthritis    knee   Carotid artery occlusion    Diabetes mellitus    metformin,januvia,and glipizide daily   Enlarged prostate    pt states no trouble with it   History of COVID-19 08/2020  Hyperlipidemia    takes Simvastatin daily   Hypertension    takes Atenolol and Lisinopril daily   Impaired hearing, left    but doesn't wear hearing aids   Seasonal allergies    Past Surgical History:  Procedure Laterality Date   APPENDECTOMY     as a child   CAROTID ANGIOGRAM N/A 05/09/2013   Procedure: CAROTID ANGIOGRAM;  Surgeon: Serafina Mitchell, MD;  Location[facility] CATH LAB;  Service: Cardiovascular;  Laterality: N/A;   CAROTID ENDARTERECTOMY Right 2008    Re-do Mar 04, 2012   carotidendartectomy  2008   right side   CATARACT EXTRACTION W/PHACO Left 04/08/2021   Procedure: CATARACT EXTRACTION PHACO AND INTRAOCULAR LENS PLACEMENT (IOC) LEFT DIABETIC  15.64 01:28.9;  Surgeon: Birder Robson, MD;  Location: [place];  Service: Ophthalmology;  Laterality: Left;   CATARACT EXTRACTION W/PHACO Right 04/22/2021   Procedure: CATARACT EXTRACTION PHACO AND INTRAOCULAR LENS PLACEMENT (IOC) RIGHT DIABETIC 12.52 01:16.4;  Surgeon: Birder Robson, MD;  Location: [place];  Service: Ophthalmology;  Laterality: Right;   ENDARTERECTOMY  03/04/2012   Procedure: ENDARTERECTOMY CAROTID;  Surgeon: Rosetta Posner, MD;  Location: [facility] OR;  Service: Vascular;  Laterality: Right;  Redo Right Carotid Endarterectomy with hemasheild patch angioplasty   ENDARTERECTOMY Right 06/26/2020   Procedure: REDO CAROTID ENDARTERECTOMY;  Surgeon: Rosetta Posner, MD;  [facility] OR;  Service: Vascular;  Laterality: Right;   WOUND EXPLORATION Right 06/26/2020   Procedure: EXPLORATION OF RIGHT NECK INCISION;  Surgeon: Rosetta Posner, MD;  Location: [facility] OR;  Service: Vascular;  Laterality: Right;   Family History  Problem Relation Age of Onset   Leukemia Mother    Asthma Father    Anesthesia problems Neg Hx    Hypotension Neg Hx    Malignant hyperthermia Neg Hx    Pseudochol deficiency Neg Hx    Colon cancer Neg Hx    Stomach cancer Neg Hx    Esophageal cancer Neg Hx    Pancreatic cancer Neg Hx    Social History   Socioeconomic History   Marital status: Married    Spouse name: Nadiene   Number of children: 2   Years of education: Not on file   Highest education level: Not on file  Occupational History   Not on file  Tobacco Use   Smoking status: Former    Years: 25.00    Types: Cigarettes    Quit date: 10/13/1991    Years since quitting: 31.1   Smokeless tobacco: Never  Vaping Use   Vaping Use: Never used  Substance and Sexual Activity   Alcohol use: No    Comment: occassional glass of wine   Drug use: No   Sexual activity: Not Currently  Other Topics Concern   Not on file  Social History Narrative   2 daughters.    Wife is currently  undergoing cancer treatments.    Social Determinants of Health   Financial Resource Strain: Low Risk  (12/11/2022)   Overall Financial Resource Strain (CARDIA)    Difficulty of Paying Living Expenses: Not hard at all  Food Insecurity: No Food Insecurity (12/11/2022)   Hunger Vital Sign    Worried About Running Out of Food in the Last Year: Never true    Ran Out of Food in the Last Year: Never true  Transportation Needs: No Transportation Needs (12/11/2022)   PRAPARE - Hydrologist (Medical): No    Lack of Transportation (  Non-Medical): No  Physical Activity: Insufficiently Active (12/11/2022)   Exercise Vital Sign    Days of Exercise per Week: 4 days    Minutes of Exercise per Session: 20 min  Stress: Stress Concern Present (12/11/2022)   [place]    Feeling of Stress : To some extent  Social Connections: Moderately Integrated (12/11/2022)   Social Connection and Isolation Panel [NHANES]    Frequency of Communication with Friends and Family: More than three times a week    Frequency of Social Gatherings with Friends and Family: More than three times a week    Attends Religious Services: 1 to 4 times per year    Active Member of [employer] or Organizations: No    Attends Archivist Meetings: 1 to 4 times per year    Marital Status: Widowed    Tobacco Counseling Counseling given: Not Answered   Clinical Intake:  Pre-visit preparation completed: Yes  Pain : No/denies pain     Diabetes: Yes CBG done?: No Did pt. bring in CBG monitor from home?: No     Diabetic?yes  Interpreter Needed?: No    Activities of Daily Living    12/11/2022    3:14 PM  In your present state of health, do you have any difficulty performing the following activities:  Hearing? 0  Vision? 1  Difficulty concentrating or making decisions? 0  Walking or climbing stairs? 0  Dressing or bathing? 1  Doing  errands, shopping? 0  Preparing Food and eating ? N  Using the Toilet? Y  In the past six months, have you accidently leaked urine? N  Do you have problems with loss of bowel control? N  Managing your Medications? Y  Managing your Finances? N  Housekeeping or managing your Housekeeping? N    Patient Care Team: Susy Frizzle, MD as PCP - General (Family Medicine)  Indicate any recent Medical Services you may have received from other than [facility] providers in the past year (date may be approximate).     Assessment:   This is a routine wellness examination for Perla.  Hearing/Vision screen No results found.  Dietary issues and exercise activities discussed: Current Exercise Habits: Home exercise routine (walk), Type of exercise: walking, Time (Minutes): 20, Frequency (Times/Week): 4, Weekly Exercise (Minutes/Week): 80, Intensity: Moderate   Goals Addressed   None    Depression Screen    12/11/2022    3:23 PM 06/22/2022   12:08 PM 11/21/2021   11:20 AM 10/31/2021   11:43 AM 09/13/2014    8:00 AM  PHQ 2/9 Scores  PHQ - 2 Score 1 0 0 0 0    Fall Risk    06/22/2022   12:11 PM 11/21/2021   11:24 AM 10/31/2021   11:43 AM 10/09/2020    3:00 PM 03/29/2018   11:19 AM  Fall Risk   Falls in the past year? 1  0 0 No  Number falls in past yr: 1 0  0   Injury with Fall? 1 0  0   Risk for fall due to : History of fall(s);Impaired balance/gait;Impaired mobility Impaired balance/gait  Impaired balance/gait;Impaired mobility   Follow up Falls prevention discussed Falls prevention discussed       FALL RISK PREVENTION PERTAINING TO THE HOME:  Any stairs in or around the home? Yes  If so, are there any without handrails? Yes  Home free of loose throw rugs in walkways, pet beds, electrical cords,  etc? Yes  Adequate lighting in your home to reduce risk of falls? Yes   ASSISTIVE DEVICES UTILIZED TO PREVENT FALLS:  Life alert? No  Use of a cane, walker or w/c? No  Grab bars in the  bathroom? No  Shower chair or bench in shower? No  Elevated toilet seat or a handicapped toilet? No   TIMED UP AND GO:  Was the test performed? Yes .  Length of time to ambulate 10 feet: 5 sec.   Gait steady and fast without use of assistive device  Cognitive Function:        12/11/2022    3:17 PM 11/21/2021   11:26 AM  6CIT Screen  What Year? 4 points 0 points  What month? 0 points 0 points  What time? 0 points 0 points  Count back from 20 4 points 4 points  Months in reverse 4 points 4 points  Repeat phrase 10 points 10 points  Total Score 22 points 18 points    Immunizations Immunization History  Administered Date(s) Administered   Fluad Quad(high Dose 65+) 06/22/2019   Influenza, High Dose Seasonal PF 07/13/2018, 08/13/2020   Influenza,inj,Quad PF,6+ Mos 08/17/2014, 09/10/2016   PFIZER(Purple Top)SARS-COV-2 Vaccination 11/18/2019, 12/12/2019   Pneumococcal Conjugate-13 08/17/2014   Pneumococcal Polysaccharide-23 10/13/2003    TDAP status: Due, Education has been provided regarding the importance of this vaccine. Advised may receive this vaccine at local pharmacy or Health Dept. Aware to provide a copy of the vaccination record if obtained from local pharmacy or Health Dept. Verbalized acceptance and understanding.  Flu Vaccine status: Up to date  Pneumococcal vaccine status: Up to date  Covid-19 vaccine status: Declined, Education has been provided regarding the importance of this vaccine but patient still declined. Advised may receive this vaccine at local pharmacy or Health Dept.or vaccine clinic. Aware to provide a copy of the vaccination record if obtained from local pharmacy or Health Dept. Verbalized acceptance and understanding.  Qualifies for Shingles Vaccine? No   Zostavax completed No   Shingrix Completed?: No.    Education has been provided regarding the importance of this vaccine. Patient has been advised to call insurance company to determine out of  pocket expense if they have not yet received this vaccine. Advised may also receive vaccine at local pharmacy or Health Dept. Verbalized acceptance and understanding.  Screening Tests Health Maintenance  Topic Date Due   DTaP/Tdap/Td (1 - Tdap) Never done   Zoster Vaccines- Shingrix (1 of 2) Never done   COVID-19 Vaccine (3 - Pfizer risk series) 01/09/2020   OPHTHALMOLOGY EXAM  03/05/2022   FOOT EXAM  04/10/2023   HEMOGLOBIN A1C  04/19/2023   Medicare Annual Wellness (AWV)  12/11/2023   Pneumonia Vaccine 59+ Years old  Completed   INFLUENZA VACCINE  Completed   HPV VACCINES  Aged Out    Health Maintenance  Health Maintenance Due  Topic Date Due   DTaP/Tdap/Td (1 - Tdap) Never done   Zoster Vaccines- Shingrix (1 of 2) Never done   COVID-19 Vaccine (3 - Pfizer risk series) 01/09/2020   OPHTHALMOLOGY EXAM  03/05/2022    Colorectal cancer screening: No longer required.   Lung Cancer Screening: (Low Dose CT Chest recommended if Age 21-80 years, 30 pack-year currently smoking OR have quit w/in 15years.) does not qualify.   Lung Cancer Screening Referral: n/a  Additional Screening:  Hepatitis C Screening: does not qualify; Completed n/a  Vision Screening: Recommended annual ophthalmology exams for early detection of glaucoma  and other disorders of the eye. Is the patient up to date with their annual eye exam?  No  Who is the provider or what is the name of the office in which the patient attends annual eye exams? N/a If pt is not established with a provider, would they like to be referred to a provider to establish care? No .   Dental Screening: Recommended annual dental exams for proper oral hygiene  Community Resource Referral / Chronic Care Management: CRR required this visit?  No   CCM required this visit?  No      Plan:     I have personally reviewed and noted the following in the patient's chart:   Medical and social history Use of alcohol, tobacco or illicit  drugs  Current medications and supplements including opioid prescriptions. Patient is currently taking opioid prescriptions. Information provided to patient regarding non-opioid alternatives. Patient advised to discuss non-opioid treatment plan with their provider. Functional ability and status Nutritional status Physical activity Advanced directives List of other physicians Hospitalizations, surgeries, and ER visits in previous 12 months Vitals Screenings to include cognitive, depression, and falls Referrals and appointments  In addition, I have reviewed and discussed with patient certain preventive protocols, quality metrics, and best practice recommendations. A written personalized care plan for preventive services as well as general preventive health recommendations were provided to patient.     Colman Cater, [facility]   12/11/2022   Nurse Notes: pt's b/p today was high 160/80, advice pt's daughter, to f/u with pcp re B/P. Daughter voiced understanding.

## 2022-12-12 LAB — BASIC METABOLIC PANEL WITH GFR
BUN/Creatinine Ratio: 20 (calc) (ref 6–22)
BUN: 28 mg/dL — ABNORMAL HIGH (ref 7–25)
CO2: 26 mmol/L (ref 20–32)
Calcium: 9.2 mg/dL (ref 8.6–10.3)
Chloride: 106 mmol/L (ref 98–110)
Creat: 1.42 mg/dL — ABNORMAL HIGH (ref 0.70–1.22)
Glucose, Bld: 182 mg/dL — ABNORMAL HIGH (ref 65–99)
Potassium: 5 mmol/L (ref 3.5–5.3)
Sodium: 139 mmol/L (ref 135–146)
eGFR: 47 mL/min/{1.73_m2} — ABNORMAL LOW (ref >=60)

## 2022-12-17 ENCOUNTER — Other Ambulatory Visit: Payer: Self-pay | Admitting: Family Medicine

## 2022-12-17 MED ORDER — TRULICITY 0.75 MG/0.5ML ~~LOC~~ SOAJ: 0.7500 mg | SUBCUTANEOUS | 0 refills | Status: DC

## 2022-12-29 ENCOUNTER — Ambulatory Visit: Payer: PPO | Admitting: Physician Assistant

## 2022-12-29 ENCOUNTER — Encounter: Payer: Self-pay | Admitting: Physician Assistant

## 2022-12-29 VITALS — BP 130/80 | HR 84 | Ht 63.0 in | Wt 171.5 lb

## 2022-12-29 DIAGNOSIS — K5909 Other constipation: Secondary | ICD-10-CM | POA: Diagnosis not present

## 2022-12-29 MED ORDER — LINACLOTIDE 290 MCG PO CAPS: 290.0000 ug | ORAL_CAPSULE | Freq: Every day | ORAL | 3 refills | Status: AC

## 2022-12-29 NOTE — Progress Notes (Signed)
Chief Complaint: Follow-up constipation  HPI:    [name] is an 89 year old male with a past medical history as listed below including diabetes, hypertension and multiple others, known to Dr. Henrene Pastor, who returns to clinic today for follow-up of his constipation and a refill of his Linzess.      11/11/2020 office visit with me to discuss constipation.  At that time discussed a colonoscopy given that he had never had 1 but he declined.  Started the patient on Linzess 145 mcg daily.    11/18/2020 patient called in and was still constipated.  At that time did a MiraLAX bowel prep and then continued Linzess at 290 mcg daily.  Later they called and said the 145 mcg were working.    07/20/2022 CTAP with contrast with no acute intra-abdominal pathology.    12/11/2022 BMP with elevated creatinine at 1.42 (around patient's baseline), glucose 182.    Today, the patient presents to clinic accompanied by his daughter, he is very hard of hearing so she assists with history.  Currently he is using his Linzess 145 mcg 2 tabs a day and this gives him a bowel movement every other day.  He denies any GI complaints including abdominal pain or bloating.    Denies fever, chills, weight loss, blood in his stool, nausea or vomiting.  Past Medical History:  Diagnosis Date   Arthritis    knee   Carotid artery occlusion    Diabetes mellitus    metformin,januvia,and glipizide daily   Enlarged prostate    pt states no trouble with it   History of COVID-19 08/2020   Hyperlipidemia    takes Simvastatin daily   Hypertension    takes Atenolol and Lisinopril daily   Impaired hearing, left    but doesn't wear hearing aids   Seasonal allergies     Past Surgical History:  Procedure Laterality Date   APPENDECTOMY     as a child   CAROTID ANGIOGRAM N/A 05/09/2013   Procedure: CAROTID ANGIOGRAM;  Surgeon: Serafina Mitchell, MD;  Location: [facility] CATH LAB;  Service: Cardiovascular;  Laterality: N/A;   CAROTID ENDARTERECTOMY  Right 2008    Re-do Mar 04, 2012   carotidendartectomy  2008   right side   CATARACT EXTRACTION W/PHACO Left 04/08/2021   Procedure: CATARACT EXTRACTION PHACO AND INTRAOCULAR LENS PLACEMENT (IOC) LEFT DIABETIC 15.64 01:28.9;  Surgeon: Birder Robson, MD;  Location: [place];  Service: Ophthalmology;  Laterality: Left;   CATARACT EXTRACTION W/PHACO Right 04/22/2021   Procedure: CATARACT EXTRACTION PHACO AND INTRAOCULAR LENS PLACEMENT (IOC) RIGHT DIABETIC 12.52 01:16.4;  Surgeon: Birder Robson, MD;  Location: [place];  Service: Ophthalmology;  Laterality: Right;   ENDARTERECTOMY  03/04/2012   Procedure: ENDARTERECTOMY CAROTID;  Surgeon: Rosetta Posner, MD;  Location: [facility] OR;  Service: Vascular;  Laterality: Right;  Redo Right Carotid Endarterectomy with hemasheild patch angioplasty   ENDARTERECTOMY Right 06/26/2020   Procedure: REDO CAROTID ENDARTERECTOMY;  Surgeon: Rosetta Posner, MD;  Location: [facility] OR;  Service: Vascular;  Laterality: Right;   WOUND EXPLORATION Right 06/26/2020   Procedure: EXPLORATION OF RIGHT NECK INCISION;  Surgeon: Rosetta Posner, MD;  Location: [facility] OR;  Service: Vascular;  Laterality: Right;    Current Outpatient Medications  Medication Sig Dispense Refill   albuterol (VENTOLIN HFA) 108 (90 Base) MCG/ACT inhaler Inhale 2 puffs into the lungs every 6 (six) hours as needed for wheezing or shortness of breath. 6.7 g 0   ASPIRIN LOW  DOSE 81 MG tablet TAKE 1 TABLET BY MOUTH DAILY. SWALLOW WHOLE. 90 tablet 3   atenolol (TENORMIN) 50 MG tablet Take 1 tablet (50 mg total) by mouth daily. 90 tablet 1   dapagliflozin propanediol (FARXIGA) 10 MG TABS tablet Take 1 tablet (10 mg total) by mouth daily before breakfast. 90 tablet 3   docusate sodium (CVS STOOL SOFTENER) 100 MG capsule Take 100 mg by mouth 3 (three) times a week.     Dulaglutide (TRULICITY) [id number] SOPN Inject 0.75 mg into the skin every Monday. 3 mL 0   glipiZIDE (GLUCOTROL XL) 10 MG 24 hr  tablet TAKE 1 TABLET BY MOUTH EVERY DAY WITH BREAKFAST (Patient taking differently: Take 10 mg by mouth daily with breakfast.) 90 tablet 2   Glucose Blood (BLOOD GLUCOSE TEST STRIPS) STRP Please dispense as One touch Ultra Blue. Use as directed to monitor FSBS 3x daily. Dx: E11.9. 100 each 4   insulin glargine (LANTUS SOLOSTAR) 100 UNIT/ML Solostar Pen Inject 10 Units into the skin daily. 15 mL PRN   Insulin Pen Needle (BD PEN NEEDLE NANO 2ND GEN) 32G X 4 MM MISC Use to administer insulin daily. 100 each 1   linaclotide (LINZESS) 145 MCG CAPS capsule Take 1 capsule (145 mcg total) by mouth daily before breakfast. Needs office visit 30 capsule 1   No current facility-administered medications for this visit.    Allergies as of 12/29/2022 - Review Complete 12/11/2022  Allergen Reaction Noted   Tape Other (See Comments) 09/05/2020   Losartan Nausea Only and Other (See Comments) 07/19/2022    Family History  Problem Relation Age of Onset   Leukemia Mother    Asthma Father    Anesthesia problems Neg Hx    Hypotension Neg Hx    Malignant hyperthermia Neg Hx    Pseudochol deficiency Neg Hx    Colon cancer Neg Hx    Stomach cancer Neg Hx    Esophageal cancer Neg Hx    Pancreatic cancer Neg Hx     Social History   Socioeconomic History   Marital status: Married    Spouse name: Nadiene   Number of children: 2   Years of education: Not on file   Highest education level: Not on file  Occupational History   Not on file  Tobacco Use   Smoking status: Former    Years: 25    Types: Cigarettes    Quit date: 10/13/1991    Years since quitting: 31.2   Smokeless tobacco: Never  Vaping Use   Vaping Use: Never used  Substance and Sexual Activity   Alcohol use: No    Comment: occassional glass of wine   Drug use: No   Sexual activity: Not Currently  Other Topics Concern   Not on file  Social History Narrative   2 daughters.    Wife is currently undergoing cancer treatments.    Social  Determinants of Health   Financial Resource Strain: Low Risk  (12/11/2022)   Overall Financial Resource Strain (CARDIA)    Difficulty of Paying Living Expenses: Not hard at all  Food Insecurity: No Food Insecurity (12/11/2022)   Hunger Vital Sign    Worried About Running Out of Food in the Last Year: Never true    Ran Out of Food in the Last Year: Never true  Transportation Needs: No Transportation Needs (12/11/2022)   PRAPARE - Hydrologist (Medical): No    Lack of Transportation (Non-Medical): No  Physical Activity: Insufficiently Active (12/11/2022)   Exercise Vital Sign    Days of Exercise per Week: 4 days    Minutes of Exercise per Session: 20 min  Stress: Stress Concern Present (12/11/2022)   [place]    Feeling of Stress : To some extent  Social Connections: Moderately Integrated (12/11/2022)   Social Connection and Isolation Panel [NHANES]    Frequency of Communication with Friends and Family: More than three times a week    Frequency of Social Gatherings with Friends and Family: More than three times a week    Attends Religious Services: 1 to 4 times per year    Active Member of [employer] or Organizations: No    Attends Archivist Meetings: 1 to 4 times per year    Marital Status: Widowed  Intimate Partner Violence: Not At Risk (12/11/2022)   Humiliation, Afraid, Rape, and Kick questionnaire    Fear of Current or Ex-Partner: No    Emotionally Abused: No    Physically Abused: No    Sexually Abused: No    Review of Systems:    Constitutional: No weight loss, fever or chills Cardiovascular: No chest pain  Respiratory: No SOB  Gastrointestinal: See HPI and otherwise negative   Physical Exam:  Vital signs: BP 130/80 (BP Location: Left Arm, Patient Position: Sitting, Cuff Size: Normal)   Pulse 84   Ht 5\' 3"  (1.6 m)   Wt 171 lb 8 oz (77.8 kg)   BMI 30.38 kg/m    Constitutional:    Pleasant Elderly Caucasian male appears to be in NAD, Well developed, Well nourished, alert and cooperative +very HOH Respiratory: Respirations even and unlabored. Lungs clear to auscultation bilaterally.   No wheezes, crackles, or rhonchi.  Cardiovascular: Normal S1, S2. No MRG. Regular rate and rhythm. No peripheral edema, cyanosis or pallor.  Gastrointestinal:  Soft, nondistended, nontender. No rebound or guarding. Normal bowel sounds. No appreciable masses or hepatomegaly. Rectal:  Not performed.  Psychiatric: Oriented to person, place and time. Demonstrates good judgement and reason without abnormal affect or behaviors.  RELEVANT LABS AND IMAGING: CBC    Component Value Date/Time   WBC 7.0 10/19/2022 1208   RBC 5.11 10/19/2022 1208   HGB 15.7 10/19/2022 1208   HCT 48.4 10/19/2022 1208   PLT 182 10/19/2022 1208   MCV 94.7 10/19/2022 1208   MCH 30.7 10/19/2022 1208   MCHC 32.4 10/19/2022 1208   RDW 13.4 10/19/2022 1208   LYMPHSABS 2,296 10/19/2022 1208   MONOABS 0.6 07/19/2022 2145   EOSABS 112 10/19/2022 1208   BASOSABS 28 10/19/2022 1208    CMP     Component Value Date/Time   NA 139 12/11/2022 1539   K 5.0 12/11/2022 1539   CL 106 12/11/2022 1539   CO2 26 12/11/2022 1539   GLUCOSE 182 (H) 12/11/2022 1539   BUN 28 (H) 12/11/2022 1539   CREATININE 1.42 (H) 12/11/2022 1539   CALCIUM 9.2 12/11/2022 1539   PROT 7.3 10/19/2022 1208   ALBUMIN 3.3 (L) 07/19/2022 2318   AST 14 10/19/2022 1208   ALT 13 10/19/2022 1208   ALKPHOS 75 07/19/2022 2318   BILITOT 0.7 10/19/2022 1208   GFRNONAA 42 (L) 07/19/2022 2145   GFRNONAA 33 (L) 06/04/2020 1535   GFRAA 33 (L) 06/27/2020 0434   GFRAA 38 (L) 06/04/2020 1535    Assessment: 1.  Chronic constipation: All symptoms resolved on Linzess 290 mcg daily  Plan: 1.  Prescribed Linzess 290 mcg daily, 1 tab every morning, 30 minutes before food, #90 with 3 refills 2.  Patient can have another refill in a year, but if he needs further  refills will need to be seen in 2 years.  Ellouise Newer, PA-C [facility] 12/29/2022, 8:56 AM  Cc: Susy Frizzle, MD

## 2022-12-29 NOTE — Patient Instructions (Signed)
_______________________________________________________  If your blood pressure at your visit was 140/90 or greater, please contact your primary care physician to follow up on this.  _______________________________________________________  If you are age 87 or older, your body mass index should be between 23-30. Your Body mass index is 30.38 kg/m. If this is out of the aforementioned range listed, please consider follow up with your Primary Care Provider.  If you are age 34 or younger, your body mass index should be between 19-25. Your Body mass index is 30.38 kg/m. If this is out of the aformentioned range listed, please consider follow up with your Primary Care Provider.   ________________________________________________________  The [facility] providers would like to encourage you to use [facility] to communicate with providers for non-urgent requests or questions.  Due to long hold times on the telephone, sending your provider a message by [facility] may be a faster and more efficient way to get a response.  Please allow 48 business hours for a response.  Please remember that this is for non-urgent requests.  _______________________________________________________  We have sent the following medications to your pharmacy for you to pick up at your convenience: Linzess  I appreciate the opportunity to care for you. Ellouise Newer PA-C

## 2023-01-05 ENCOUNTER — Telehealth: Payer: Self-pay

## 2023-01-05 NOTE — Progress Notes (Signed)
Noted  

## 2023-01-05 NOTE — Telephone Encounter (Signed)
PA-Trullity 0.75mg /0.5mg   Has been approved effective 12/29/22 thru 12/29/2023  Spoke w/pt's daughter regarding the above meds. And should be ready at the pharmacy for pick up.   Per  daughter, [name], voiced understanding and nothing further.

## 2023-02-01 ENCOUNTER — Ambulatory Visit: Payer: PPO | Admitting: Pharmacist

## 2023-02-01 NOTE — Progress Notes (Signed)
Care Management & Coordination Services Pharmacy Note  02/01/2023 Name:  [name] MRN:  [id number] DOB:  [birth year]  Summary: PharmD FU visit.  Doing well all glucose readings < 200.  Meds are affordable and no hypoglycemia.  Asked them to schedule FU appt in July for physical and updated labs.  Recommendations/Changes made from today's visit: Recheck lipids, could consider statin if LDL elevated due to carotid artery occlusion.  Follow up plan: FU 6 months   Subjective: [name] is an 89 y.o. year old male who is a primary patient of [name], Priscille Heidelberg, MD.  The care coordination team was consulted for assistance with disease management and care coordination needs.    Engaged with patient by telephone for follow up visit.  06/22/22 Lynnea Ferrier, MD - Family Medicine - Fever - Labs were ordered. CXR ordered. insulin glargine (LANTUS SOLOSTAR) 100 UNIT/ML Solostar Pen prescribed. Begin Lantus 10 units subcu every morning. Increase Lantus by 1 unit daily until fasting blood sugars are less than 130. Follow up as scheduled.    Recent consult visits:  None noted.   Hospital visits:  Medication Reconciliation was completed by comparing discharge summary, patient's EMR and Pharmacy list, and upon discussion with patient.   Admitted to the hospital on 07/19/22 due to Nausea/Vomiting/Confusion. Discharge date was 07/20/22. Discharged from [facility].     New?Medications Started at [facility] Discharge:?? None noted   Medication Changes at Hospital Discharge: None noted   Medications Discontinued at Hospital Discharge: None noted   Medications that remain the same after Hospital Discharge:??  All other medications will remain the same.     Objective:  Lab Results  Component Value Date   CREATININE 1.42 (H) 12/11/2022   BUN 28 (H) 12/11/2022   EGFR 47 (L) 12/11/2022   GFRNONAA 42 (L) 07/19/2022   GFRAA 33 (L) 06/27/2020   NA 139 12/11/2022   K  5.0 12/11/2022   CALCIUM 9.2 12/11/2022   CO2 26 12/11/2022   GLUCOSE 182 (H) 12/11/2022    Lab Results  Component Value Date/Time   HGBA1C 7.6 (H) 10/19/2022 12:08 PM   HGBA1C 8.7 (H) 04/09/2022 11:25 AM   MICROALBUR 2.1 03/29/2018 11:54 AM   MICROALBUR 0.7 08/17/2014 04:02 PM    Last diabetic Eye exam:  Lab Results  Component Value Date/Time   HMDIABEYEEXA No Retinopathy 03/05/2021 10:00 AM    Last diabetic Foot exam: No results found for: "HMDIABFOOTEX"   Lab Results  Component Value Date   CHOL 176 04/09/2022   HDL 41 04/09/2022   LDLCALC 107 (H) 04/09/2022   TRIG 168 (H) 04/09/2022   CHOLHDL 4.3 04/09/2022       Latest Ref Rng & Units 10/19/2022   12:08 PM 07/19/2022   11:18 PM 06/22/2022   12:29 PM  Hepatic Function  Total Protein 6.1 - 8.1 g/dL 7.3  6.1  6.8   Albumin 3.5 - 5.0 g/dL  3.3    AST 10 - 35 U/L ALT 9 - 46 U/L Alk Phosphatase 38 - 126 U/L  75    Total Bilirubin 0.2 - 1.2 mg/dL 0.7  1.0  0.5   Bilirubin, Direct 0.0 - 0.2 mg/dL  0.2      Lab Results  Component Value Date/Time   TSH 2.96 03/29/2018 11:54 AM       Latest Ref Rng & Units 10/19/2022   12:08 PM  07/19/2022    9:45 PM 06/22/2022   12:29 PM  CBC  WBC 3.8 - 10.8 Thousand/uL 7.0  14.9  8.9   Hemoglobin 13.2 - 17.1 g/dL 16.1  09.6  04.5   Hematocrit 38.5 - 50.0 % 48.4  39.1  46.8   Platelets 140 - 400 Thousand/uL 182  127  226     No results found for: "VD25OH", "VITAMINB12"  Clinical ASCVD: No  The ASCVD Risk score (Arnett DK, et al., 2019) failed to calculate for the following reasons:   The 2019 ASCVD risk score is only valid for ages 60 to 73        12/11/2022    3:23 PM 06/22/2022   12:08 PM 11/21/2021   11:20 AM  Depression screen PHQ 2/9  Decreased Interest 0 0 0  Down, Depressed, Hopeless 1 0 0  PHQ - 2 Score 1 0 0     Social History   Tobacco Use  Smoking Status Former   Years: 25   Types: Cigarettes   Quit date: 10/13/1991   Years since  quitting: 31.3  Smokeless Tobacco Never   BP Readings from Last 3 Encounters:  12/29/22 130/80  12/11/22 (!) 160/80  12/08/22 (!) 160/86   Pulse Readings from Last 3 Encounters:  12/29/22 84  12/11/22 78  12/08/22 72   Wt Readings from Last 3 Encounters:  12/29/22 171 lb 8 oz (77.8 kg)  12/11/22 174 lb (78.9 kg)  12/08/22 171 lb (77.6 kg)   BMI Readings from Last 3 Encounters:  12/29/22 30.38 kg/m  12/11/22 28.08 kg/m  12/08/22 27.60 kg/m    Allergies  Allergen Reactions   Tape Other (See Comments)    PLEASE USE EASY-RELEASE TAPE!!!! THE SKIN IS VERY SENSITIVE!!   Losartan Nausea Only and Other (See Comments)    Severe nausea    Medications Reviewed Today     Reviewed by Erroll Luna, [facility] (Pharmacist) on 02/01/23 at 1457  Med List Status: <None>   Medication Order Taking? Sig Documenting Provider Last Dose Status Informant  albuterol (VENTOLIN HFA) 108 (90 Base) MCG/ACT inhaler [id number] No Inhale 2 puffs into the lungs every 6 (six) hours as needed for wheezing or shortness of breath. Leroy Sea, MD Taking Active Family Member  ASPIRIN LOW DOSE 81 MG tablet [id number] No TAKE 1 TABLET BY MOUTH DAILY. SWALLOW WHOLE. Maeola Harman, MD Taking Active   atenolol (TENORMIN) 50 MG tablet [id number] No Take 1 tablet (50 mg total) by mouth daily. Donita Brooks, MD Taking Active Family Member  dapagliflozin propanediol (FARXIGA) 10 MG TABS tablet [id number] No Take 1 tablet (10 mg total) by mouth daily before breakfast. Donita Brooks, MD Taking Active   docusate sodium ([facility] STOOL SOFTENER) 100 MG capsule [id number] No Take 100 mg by mouth 3 (three) times a week. [provider] Taking Active Family Member  Dulaglutide (TRULICITY) 0.75 MG/0.5ML SOPN [id number] No Inject 0.75 mg into the skin every Monday. Donita Brooks, MD Taking Active   glipiZIDE (GLUCOTROL XL) 10 MG 24 hr tablet [id number] No TAKE 1 TABLET BY MOUTH EVERY DAY WITH  BREAKFAST  Patient taking differently: Take 10 mg by mouth daily with breakfast.   Donita Brooks, MD Taking Active Family Member  Glucose Blood (BLOOD GLUCOSE TEST STRIPS) STRP [id number] No Please dispense as One touch Ultra Blue. Use as directed to monitor FSBS 3x daily. Dx: E11.9. Donita Brooks, MD Taking Active Family Member  insulin glargine (  LANTUS SOLOSTAR) 100 UNIT/ML Solostar Pen [id number] No Inject 10 Units into the skin daily. Donita Brooks, MD Taking Active Family Member  Insulin Pen Needle (BD PEN NEEDLE NANO 2ND GEN) 32G X 4 MM MISC [id number] No Use to administer insulin daily. Donita Brooks, MD Taking Active   linaclotide Karlene Einstein) 290 MCG CAPS capsule [id number]  Take 1 capsule (290 mcg total) by mouth daily before breakfast. Unk Lightning, PA  Active             SDOH:  (Social Determinants of Health) assessments and interventions performed: Yes Financial Resource Strain: Low Risk  (12/11/2022)   Overall Financial Resource Strain (CARDIA)    Difficulty of Paying Living Expenses: Not hard at all   Food Insecurity: No Food Insecurity (12/11/2022)   Hunger Vital Sign    Worried About Running Out of Food in the Last Year: Never true    Ran Out of Food in the Last Year: Never true    SDOH Interventions    Flowsheet Row Clinical Support from 12/11/2022 in [facility] Clinical Support from 11/21/2021 in [facility] Chronic Care Management from 10/31/2021 in [facility]  SDOH Interventions     Food Insecurity Interventions Intervention Not Indicated Intervention Not Indicated Intervention Not Indicated  Housing Interventions Intervention Not Indicated Intervention Not Indicated --  Transportation Interventions Intervention Not Indicated Intervention Not Indicated Intervention Not Indicated  Utilities Interventions Intervention Not Indicated -- --  Alcohol Usage Interventions Intervention Not  Indicated (Score <7) -- --  Financial Strain Interventions Intervention Not Indicated Intervention Not Indicated --  Physical Activity Interventions Intervention Not Indicated 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.] --  Stress Interventions Intervention Not Indicated Intervention Not Indicated --  Social Connections Interventions Intervention Not Indicated Intervention Not Indicated --      SDOH Screenings   Food Insecurity: No Food Insecurity (12/11/2022)  Housing: Low Risk  (12/11/2022)  Transportation Needs: No Transportation Needs (12/11/2022)  Utilities: Not At Risk (12/11/2022)  Alcohol Screen: Low Risk  (12/11/2022)  Depression (PHQ2-9): Low Risk  (12/11/2022)  Financial Resource Strain: Low Risk  (12/11/2022)  Physical Activity: Insufficiently Active (12/11/2022)  Social Connections: Moderately Integrated (12/11/2022)  Stress: Stress Concern Present (12/11/2022)  Tobacco Use: Medium Risk (12/29/2022)    Medication Assistance: Application for Farxiga  medication assistance program. in process.  Anticipated assistance start date 1 week.  See plan of care for additional detail.  Medication Access: Within the past 30 days, how often has patient missed a dose of medication? 0 Is a pillbox or other method used to improve adherence? Yes  Factors that may affect medication adherence? financial need Are meds synced by current pharmacy? No  Are meds delivered by current pharmacy? No  Does patient experience delays in picking up medications due to transportation concerns? No   [facility] Services Reviewed: Is patient disadvantaged to use [facility]?: Yes  Current Rx insurance plan: HTA Name and location of Current pharmacy:  [id number] [place], [place] - [address] [place] [address] Phone: [phone] Fax: [phone]  [facility] - [address] [place] - [address] [place] [address] Phone:  [phone] Fax: [phone]  [facility] services reviewed with patient today?: Yes  Patient requests to transfer care to [facility]?: No  Reason patient declined to change pharmacies: Disadvantaged  due to insurance/mail order  Compliance/Adherence/Medication fill history: Care Gaps: Annual wellness visit in last year? Yes 11/21/21   Star-Rating Drugs: Star Rating Drugs:  Medication:                                        Last Fill:         Day Supply glipiZIDE10 MG 24 hr tablet               10/29/22            90 dulaglutide 0.75 MG/0.5ML SOPN     10/13/22              28 dapagliflozin propanediol 10 MG        10/16/22              30   Assessment/Plan   Hypertension (BP goal <140/90) -Controlled -Current treatment: Atenolol 50mg  Appropriate, Effective, Safe, Accessible -Medications previously tried: none noted  -Current home readings: not checking at home lately -Current dietary habits: see DM -Current exercise habits: does some walking when the weather is warm -Denies hypotensive/hypertensive symptoms -Educated on BP goals and benefits of medications for prevention of heart attack, stroke and kidney damage; Importance of home blood pressure monitoring; Symptoms of hypotension and importance of maintaining adequate hydration; -Counseled to monitor BP at home once weekly if able, document, and provide log at future appointments -Recommended to continue current medication  Hyperlipidemia: (LDL goal < 70) 02/01/23 -Uncontrolled -Current treatment: None -Medications previously tried: simvastatin  -Current ASCVD also carotid artery occlusion.  I do not see recent fills for statin. -Recommended recheck lipids at a physical that they are going to schedule for July.  If elevated could consider a statin due to risk from carotid artery occlusion.   Diabetes (A1c goal <8%) 02/01/23 -Controlled, most recent A1c was 7.6% -Current medications: Farxiga 10mg  Appropriate,  Effective, Safe, Accessible Lantus 10 units daily (Backup) Appropriate, Effective, Safe, Accessible Glipizide XL 10mg  Appropriate, Effective, Query Safe,  Trulicity 0.75mg  once weekly Appropriate, Effective, Safe, Query accessible -Medications previously tried: none noted  -Current home glucose readings fasting glucose: does not usually check, his daughter checks and he has already eaten by the time she gets there post prandial glucose: reports that all glucose is below 200 at home -Denies hypoglycemic/hyperglycemic symptoms, denies any low blood sugar readings -Current meal patterns: (same, unchanged) breakfast: eggs, tomatoes, bread  lunch: soups, sandwich  dinner: home cooked meal, meat potatoes, veggies snacks:  drinks: no coffee,  big glasses of milk (whole milk), water -Current exercise: some walking during warmer seasons -Educated on A1c and blood sugar goals; Complications of diabetes including kidney damage, retinal damage, and cardiovascular disease; Exercise goal of 150 minutes per week; -Counseled to check feet daily and get yearly eye exams -Patient was out in his truck during the call.  He is still taking all of the same medications.  They have gotten assistance with Trulicity PA so he has been taking this and glucose has been well controlled. No changes needed at this time - did ask them to schedule FU for July for updated labs/A1c.  For now continue current meds, re consider the need for glipizide at upcoming physical pending A1c.     Constipation (Goal: Reduce symptoms) -Controlled -Current treatment  Linzess 145 mcg daily Appropriate, Effective, Safe, Accessible -Medications previously tried: none noted  -Recommended to  continue current medication Constipation controlled - sometimes expensive but medication is working.  Willa Frater, PharmD, CPP Clinical Pharmacist Practitioner [facility] [phone]

## 2023-02-12 ENCOUNTER — Encounter: Payer: Self-pay | Admitting: Family Medicine

## 2023-04-10 ENCOUNTER — Encounter: Payer: Self-pay | Admitting: Family Medicine

## 2023-04-12 ENCOUNTER — Other Ambulatory Visit: Payer: Self-pay | Admitting: Family Medicine

## 2023-04-12 NOTE — Telephone Encounter (Signed)
Prescription Request  04/12/2023  LOV: 10/19/2022  What is the name of the medication or equipment?   atenolol (TENORMIN) 50 MG tablet   Have you contacted your pharmacy to request a refill? Yes   Which pharmacy would you like this sent to?  [id number] [place], [place] - [address] [place] [address] Phone: [phone] Fax: [phone]    Patient notified that their request is being sent to the clinical staff for review and that they should receive a response within 2 business days.   Please advise pharmacist.

## 2023-04-13 MED ORDER — ATENOLOL 50 MG PO TABS: 50.0000 mg | ORAL_TABLET | Freq: Every day | ORAL | 1 refills | Status: DC

## 2023-04-13 NOTE — Telephone Encounter (Signed)
Requested Prescriptions  Pending Prescriptions Disp Refills   atenolol (TENORMIN) 50 MG tablet 90 tablet 1    Sig: Take 1 tablet (50 mg total) by mouth daily.     Cardiovascular: Beta Blockers 2 Failed - 04/12/2023  3:08 PM      Failed - Cr in normal range and within 360 days    Creat  Date Value Ref Range Status  12/11/2022 1.42 (H) 0.70 - 1.22 mg/dL Final         Failed - Valid encounter within last 6 months    Recent Outpatient Visits           2 years ago Blurry vision, bilateral   [facility] [name], Priscille Heidelberg, MD   2 years ago Constipation, unspecified constipation type   [facility] Cathlean Marseilles A, NP   2 years ago Uncontrolled type 2 diabetes mellitus with hyperosmolarity without coma, with long-term current use of insulin (HCC)   [facility] Donita Brooks, MD   2 years ago Type 2 diabetes mellitus with hyperglycemia, without long-term current use of insulin (HCC)   [facility] Donita Brooks, MD   3 years ago Uncontrolled type 2 diabetes mellitus with hyperosmolarity without coma, with long-term current use of insulin (HCC)   [facility] [name], Priscille Heidelberg, MD              Passed - Last BP in normal range    BP Readings from Last 1 Encounters:  12/29/22 130/80         Passed - Last Heart Rate in normal range    Pulse Readings from Last 1 Encounters:  12/29/22 84

## 2023-04-27 ENCOUNTER — Encounter: Payer: Self-pay | Admitting: Family Medicine

## 2023-04-27 ENCOUNTER — Ambulatory Visit (INDEPENDENT_AMBULATORY_CARE_PROVIDER_SITE_OTHER): Payer: PPO | Admitting: Family Medicine

## 2023-04-27 VITALS — BP 122/76 | HR 64 | Temp 97.6°F | Ht 63.0 in | Wt 167.0 lb

## 2023-04-27 DIAGNOSIS — R29810 Facial weakness: Secondary | ICD-10-CM

## 2023-04-27 DIAGNOSIS — R4781 Slurred speech: Secondary | ICD-10-CM

## 2023-04-27 DIAGNOSIS — M25561 Pain in right knee: Secondary | ICD-10-CM

## 2023-04-27 NOTE — Progress Notes (Signed)
Subjective:    Patient ID: [name], male    DOB: 14-Jun-1931, 89 y.o.   MRN: [id number]  Patient is an 89 year-old white male with a history of dementia, chronic kidney disease stage IV, and poorly controlled diabetes mellitus.  Patient has a history of noncompliance regarding his diabetes.  He presents today with his daughter requesting help with the pain in his knee.  He reports pain in his right knee with standing.  He states the knee hurts simply to walk.  He has tenderness to palpation over the medial and lateral joint line.  There is a small effusion.  We discussed treatment and the patient elected to receive a cortisone injection.  After the cortisone injection, the patient's daughter added history.  She states for 4 days, the patient has been slightly slurring his speech.  He has a slight droop in his left upper lip.  He also reports increasing trouble with balance.  There is no gross neurologic deficit other than the slight droop in his upper lip.  Patient's speech today is at his baseline the patient states it has been slurred for the last few days Past Medical History:  Diagnosis Date   Arthritis    knee   Carotid artery occlusion    Diabetes mellitus    metformin,januvia,and glipizide daily   Enlarged prostate    pt states no trouble with it   History of COVID-19 08/2020   Hyperlipidemia    takes Simvastatin daily   Hypertension    takes Atenolol and Lisinopril daily   Impaired hearing, left    but doesn't wear hearing aids   Seasonal allergies    Past Surgical History:  Procedure Laterality Date   APPENDECTOMY     as a child   CAROTID ANGIOGRAM N/A 05/09/2013   Procedure: CAROTID ANGIOGRAM;  Surgeon: Nada Libman, MD;  Location: [facility] CATH LAB;  Service: Cardiovascular;  Laterality: N/A;   CAROTID ENDARTERECTOMY Right 2008    Re-do Mar 04, 2012   carotidendartectomy  2008   right side   CATARACT EXTRACTION W/PHACO Left 04/08/2021   Procedure: CATARACT  EXTRACTION PHACO AND INTRAOCULAR LENS PLACEMENT (IOC) LEFT DIABETIC 15.64 01:28.9;  Surgeon: Galen Manila, MD;  Location: [facility] SURGERY [facility]TR;  Service: Ophthalmology;  Laterality: Left;   CATARACT EXTRACTION W/PHACO Right 04/22/2021   Procedure: CATARACT EXTRACTION PHACO AND INTRAOCULAR LENS PLACEMENT (IOC) RIGHT DIABETIC 12.52 01:16.4;  Surgeon: Galen Manila, MD;  Location: [facility] SURGERY CNTR;  Service: Ophthalmology;  Laterality: Right;   ENDARTERECTOMY  03/04/2012   Procedure: ENDARTERECTOMY CAROTID;  Surgeon: Larina Earthly, MD;  Location: [facility] OR;  Service: Vascular;  Laterality: Right;  Redo Right Carotid Endarterectomy with hemasheild patch angioplasty   ENDARTERECTOMY Right 06/26/2020   Procedure: REDO CAROTID ENDARTERECTOMY;  Surgeon: Larina Earthly, MD;  Location: [facility] OR;  Service: Vascular;  Laterality: Right;   WOUND EXPLORATION Right 06/26/2020   Procedure: EXPLORATION OF RIGHT NECK INCISION;  Surgeon: Larina Earthly, MD;  Location: [facility] OR;  Service: Vascular;  Laterality: Right;   Current Outpatient Medications on File Prior to Visit  Medication Sig Dispense Refill   albuterol (VENTOLIN HFA) 108 (90 Base) MCG/ACT inhaler Inhale 2 puffs into the lungs every 6 (six) hours as needed for wheezing or shortness of breath. 6.7 g 0   ASPIRIN LOW DOSE 81 MG tablet TAKE 1 TABLET BY MOUTH DAILY. SWALLOW WHOLE. 90 tablet 3   atenolol (TENORMIN) 50 MG tablet Take 1 tablet (50  mg total) by mouth daily. 90 tablet 1   dapagliflozin propanediol (FARXIGA) 10 MG TABS tablet Take 1 tablet (10 mg total) by mouth daily before breakfast. 90 tablet 3   docusate sodium (CVS STOOL SOFTENER) 100 MG capsule Take 100 mg by mouth 3 (three) times a week.     glipiZIDE (GLUCOTROL XL) 10 MG 24 hr tablet TAKE 1 TABLET BY MOUTH EVERY DAY WITH BREAKFAST (Patient taking differently: Take 10 mg by mouth daily with breakfast.) 90 tablet 2   Glucose Blood (BLOOD GLUCOSE TEST STRIPS) STRP Please dispense as One touch Ultra  Blue. Use as directed to monitor FSBS 3x daily. Dx: E11.9. 100 each 4   insulin glargine (LANTUS SOLOSTAR) 100 UNIT/ML Solostar Pen Inject 10 Units into the skin daily. 15 mL PRN   Insulin Pen Needle (BD PEN NEEDLE NANO 2ND GEN) 32G X 4 MM MISC Use to administer insulin daily. 100 each 1   linaclotide (LINZESS) 290 MCG CAPS capsule Take 1 capsule (290 mcg total) by mouth daily before breakfast. 90 capsule 3   Dulaglutide (TRULICITY) 0.75 MG/0.5ML SOPN Inject 0.75 mg into the skin every Monday. (Patient not taking: Reported on 04/27/2023) 3 mL 0   No current facility-administered medications on file prior to visit.   Allergies  Allergen Reactions   Tape Other (See Comments)    PLEASE USE EASY-RELEASE TAPE!!!! THE SKIN IS VERY SENSITIVE!!   Losartan Nausea Only and Other (See Comments)    Severe nausea   Social History   Socioeconomic History   Marital status: Single    Spouse name: Nadiene   Number of children: 2   Years of education: Not on file   Highest education level: Not on file  Occupational History   Not on file  Tobacco Use   Smoking status: Former    Current packs/day: 0.00    Types: Cigarettes    Start date: 10/12/1966    Quit date: 10/13/1991    Years since quitting: 31.5   Smokeless tobacco: Never  Vaping Use   Vaping status: Never Used  Substance and Sexual Activity   Alcohol use: No    Comment: occassional glass of wine   Drug use: No   Sexual activity: Not Currently  Other Topics Concern   Not on file  Social History Narrative   2 daughters.    Wife is currently undergoing cancer treatments.    Social Determinants of Health   Financial Resource Strain: Low Risk  (12/11/2022)   Overall Financial Resource Strain (CARDIA)    Difficulty of Paying Living Expenses: Not hard at all  Food Insecurity: No Food Insecurity (12/11/2022)   Hunger Vital Sign    Worried About Running Out of Food in the Last Year: Never true    Ran Out of Food in the Last Year: Never true   Transportation Needs: No Transportation Needs (12/11/2022)   PRAPARE - Administrator, Civil Service (Medical): No    Lack of Transportation (Non-Medical): No  Physical Activity: Insufficiently Active (12/11/2022)   Exercise Vital Sign    Days of Exercise per Week: 4 days    Minutes of Exercise per Session: 20 min  Stress: Stress Concern Present (12/11/2022)   [employer] of Occupational Health - Occupational Stress Questionnaire    Feeling of Stress : To some extent  Social Connections: Moderately Integrated (12/11/2022)   Social Connection and Isolation Panel [NHANES]    Frequency of Communication with Friends and Family: More than three  times a week    Frequency of Social Gatherings with Friends and Family: More than three times a week    Attends Religious Services: 1 to 4 times per year    Active Member of Clubs or Organizations: No    Attends Banker Meetings: 1 to 4 times per year    Marital Status: Widowed  Intimate Partner Violence: Not At Risk (12/11/2022)   Humiliation, Afraid, Rape, and Kick questionnaire    Fear of Current or Ex-Partner: No    Emotionally Abused: No    Physically Abused: No    Sexually Abused: No      Review of Systems  All other systems reviewed and are negative.      Objective:   Physical Exam Vitals reviewed.  Constitutional:      General: He is not in acute distress.    Appearance: He is well-developed. He is not diaphoretic.  Eyes:     General: Lids are normal.        Right eye: No foreign body.        Left eye: No foreign body.     Extraocular Movements: Extraocular movements intact.     Conjunctiva/sclera:     Right eye: Right conjunctiva is not injected. No chemosis or exudate.    Left eye: Left conjunctiva is not injected. No chemosis or exudate. Neck:     Thyroid: No thyromegaly.     Vascular: No JVD.  Cardiovascular:     Rate and Rhythm: Normal rate and regular rhythm.     Heart sounds: Normal heart  sounds. No murmur heard.    No friction rub. No gallop.  Pulmonary:     Effort: Pulmonary effort is normal. No respiratory distress.     Breath sounds: Normal breath sounds. No wheezing or rales.  Abdominal:     General: Bowel sounds are normal. There is no distension.     Palpations: Abdomen is soft. There is no mass.     Tenderness: There is no abdominal tenderness. There is no guarding or rebound.  Musculoskeletal:     Cervical back: Neck supple.     Right knee: Effusion present. No erythema. Tenderness present over the medial joint line and lateral joint line.  Lymphadenopathy:     Cervical: No cervical adenopathy.  Neurological:     Mental Status: He is alert and oriented to person, place, and time.     Cranial Nerves: Facial asymmetry present.     Sensory: Sensation is intact.     Motor: No weakness, atrophy or abnormal muscle tone.     Coordination: Coordination is intact.     Gait: Gait abnormal.           Assessment & Plan:  Slurred speech  Facial droop  Acute pain of right knee I initially treated the right knee pain prior to being made aware of the slurred speech.  Using sterile technique, I injected the right knee with 2 cc of lidocaine, 2 cc of Marcaine, and 2 cc of 40 mg/mm Kenalog.  The patient tolerated the procedure well.  The patient has a slight droop in his left upper lid.  His daughter reports intermittent slurred speech.  He does have an unsteady gait.  Proceed with an MRI of the brain to evaluate for any stroke.  This occurred more than a week ago.  Therefore the patient is outside the therapeutic window

## 2023-04-29 ENCOUNTER — Telehealth: Payer: Self-pay

## 2023-04-29 ENCOUNTER — Other Ambulatory Visit: Payer: Self-pay

## 2023-04-29 ENCOUNTER — Ambulatory Visit
Admission: RE | Admit: 2023-04-29 | Discharge: 2023-04-29 | Disposition: A | Discharge status: Home / Self Care | Payer: PPO | Source: Ambulatory Visit | Attending: Family Medicine | Admitting: Family Medicine

## 2023-04-29 DIAGNOSIS — R4781 Slurred speech: Secondary | ICD-10-CM

## 2023-04-29 DIAGNOSIS — R29810 Facial weakness: Secondary | ICD-10-CM

## 2023-04-29 DIAGNOSIS — I639 Cerebral infarction, unspecified: Secondary | ICD-10-CM

## 2023-04-29 MED ORDER — GADOPICLENOL 0.5 MMOL/ML IV SOLN
7.5000 mL | Freq: Once | INTRAVENOUS | Status: AC | PRN
  Administered 2023-04-29: 7.5 mL via INTRAVENOUS

## 2023-04-29 MED ORDER — CLOPIDOGREL BISULFATE 75 MG PO TABS
75.0000 mg | ORAL_TABLET | Freq: Every day | ORAL | 0 refills | Status: DC
Start: 2023-04-29 — End: 2023-09-28

## 2023-04-29 MED ORDER — ASPIRIN 81 MG PO TBEC: 81.0000 mg | DELAYED_RELEASE_TABLET | Freq: Every day | ORAL | 1 refills | Status: DC

## 2023-04-29 NOTE — Telephone Encounter (Signed)
Pt's daughter Susa Loffler, advised of pt's MRI results per Dr. Caren Macadam order. Selena Batten also advised pt will need to start ASA 81 mg per day, Plavix 75 mg daily x 3 months and need for appointment for next week. Kim verbalized understanding of all. Appointment made for 7/25 @ 2:15 pm. Rx's sent in as ordered. Mjp,lpn

## 2023-04-30 ENCOUNTER — Ambulatory Visit: Payer: PPO | Admitting: Family Medicine

## 2023-05-06 ENCOUNTER — Encounter: Payer: Self-pay | Admitting: Family Medicine

## 2023-05-06 ENCOUNTER — Ambulatory Visit (INDEPENDENT_AMBULATORY_CARE_PROVIDER_SITE_OTHER): Payer: PPO | Admitting: Family Medicine

## 2023-05-06 VITALS — BP 128/70 | HR 67 | Temp 97.8°F | Ht 63.0 in | Wt 167.7 lb

## 2023-05-06 DIAGNOSIS — I6930 Unspecified sequelae of cerebral infarction: Secondary | ICD-10-CM

## 2023-05-06 DIAGNOSIS — I639 Cerebral infarction, unspecified: Secondary | ICD-10-CM

## 2023-05-06 LAB — CBC WITH DIFFERENTIAL/PLATELET
Absolute Monocytes: 1079 cells/uL — ABNORMAL HIGH (ref 200–950)
Basophils Absolute: 30 cells/uL (ref 0–200)
Basophils Relative: 0.3 %
Eosinophils Absolute: 129 cells/uL (ref 15–500)
Eosinophils Relative: 1.3 %
HCT: 51.1 % — ABNORMAL HIGH (ref 38.5–50.0)
Hemoglobin: 16.5 g/dL (ref 13.2–17.1)
Lymphs Abs: 2723 cells/uL (ref 850–3900)
MCH: 30.3 pg (ref 27.0–33.0)
MCV: 93.8 fL (ref 80.0–100.0)
MPV: 10.3 fL (ref 7.5–12.5)
Monocytes Relative: 10.9 %
Neutro Abs: 5940 cells/uL (ref 1500–7800)
Neutrophils Relative %: 60 %
Platelets: 167 10*3/uL (ref 140–400)
RBC: 5.45 10*6/uL (ref 4.20–5.80)
RDW: 13.3 % (ref 11.0–15.0)
Total Lymphocyte: 27.5 %
WBC: 9.9 10*3/uL (ref 3.8–10.8)

## 2023-05-06 MED ORDER — ATORVASTATIN CALCIUM 40 MG PO TABS: 40.0000 mg | ORAL_TABLET | Freq: Every day | ORAL | 3 refills | Status: DC

## 2023-05-06 NOTE — Progress Notes (Signed)
Subjective:    Patient ID: [name], male    DOB: Jun 05, 1931, 89 y.o.   MRN: [id number] 04/27/23 Patient is an 89 year-old white male with a history of dementia, chronic kidney disease stage IV, and poorly controlled diabetes mellitus.  Patient has a history of noncompliance regarding his diabetes.  He presents today with his daughter who states for 4 days, the patient has been slightly slurring his speech.  He has a slight droop in his left upper lip.  He also reports increasing trouble with balance.  There is no gross neurologic deficit other than the slight droop in his upper lip.  Patient's speech today is at his baseline, the patient states it has been slurred for the last few days  I ordered MRI: IMPRESSION: 1. 10 mm acute/subacute infarct within the left thalamus. 2. Mild-to-moderate chronic small ischemic changes within the cerebral white matter. 3. Chronic lacunar infarcts within the bilateral basal ganglia. 4. Moderate generalized cerebral atrophy. 5. Mild cerebellar atrophy. 6. Complete opacification of the left maxillary sinus due to the presence of fluid and mild-to-moderate mucosal thickening. ENT referral recommended.  05/06/23 Patient continues to have slurred speech.  However he denies any trouble walking.  Both the patient and his daughter declined physical therapy.  He is extremely hard of hearing.  First is due to another deformity of the.  He denies any headache.  Since starting dual antiplatelet therapy for entheses aspirin and Plavix) the patient denies any GI bleeding or melena.  He denies any headache.  He is not currently on a statin. Past Medical History:  Diagnosis Date   Arthritis    knee   Carotid artery occlusion    Diabetes mellitus    metformin,januvia,and glipizide daily   Enlarged prostate    pt states no trouble with it   History of COVID-19 08/2020   Hyperlipidemia    takes Simvastatin daily   Hypertension    takes Atenolol and  Lisinopril daily   Impaired hearing, left    but doesn't wear hearing aids   Seasonal allergies    Past Surgical History:  Procedure Laterality Date   APPENDECTOMY     as a child   CAROTID ANGIOGRAM N/A 05/09/2013   Procedure: CAROTID ANGIOGRAM;  Surgeon: Nada Libman, MD;  Location: [facility] CATH LAB;  Service: Cardiovascular;  Laterality: N/A;   CAROTID ENDARTERECTOMY Right 2008    Re-do Mar 04, 2012   carotidendartectomy  2008   right side   CATARACT EXTRACTION W/PHACO Left 04/08/2021   Procedure: CATARACT EXTRACTION PHACO AND INTRAOCULAR LENS PLACEMENT (IOC) LEFT DIABETIC 15.64 01:28.9;  Surgeon: Galen Manila, MD;  Location: [facility] SURGERY [facility];  Service: Ophthalmology;  Laterality: Left;   CATARACT EXTRACTION W/PHACO Right 04/22/2021   Procedure: CATARACT EXTRACTION PHACO AND INTRAOCULAR LENS PLACEMENT (IOC) RIGHT DIABETIC 12.52 01:16.4;  Surgeon: Galen Manila, MD;  Location: [facility]ERY [facility];  Service: Ophthalmology;  Laterality: Right;   ENDARTERECTOMY  03/04/2012   Procedure: ENDARTERECTOMY CAROTID;  Surgeon: Larina Earthly, MD;  Location: [facility] OR;  Service: Vascular;  Laterality: Right;  Redo Right Carotid Endarterectomy with hemasheild patch angioplasty   ENDARTERECTOMY Right 06/26/2020   Procedure: REDO CAROTID ENDARTERECTOMY;  Surgeon: Larina Earthly, MD;  Location: [facility] OR;  Service: Vascular;  Laterality: Right;   WOUND EXPLORATION Right 06/26/2020   Procedure: EXPLORATION OF RIGHT NECK INCISION;  Surgeon: Larina Earthly, MD;  Location: [facility] OR;  Service: Vascular;  Laterality: Right;   Current Outpatient  Medications on File Prior to Visit  Medication Sig Dispense Refill   albuterol (VENTOLIN HFA) 108 (90 Base) MCG/ACT inhaler Inhale 2 puffs into the lungs every 6 (six) hours as needed for wheezing or shortness of breath. 6.7 g 0   aspirin EC (ASPIRIN LOW DOSE) 81 MG tablet Take 1 tablet (81 mg total) by mouth daily. Swallow whole. 90 tablet 1   atenolol (TENORMIN) 50 MG tablet  Take 1 tablet (50 mg total) by mouth daily. 90 tablet 1   clopidogrel (PLAVIX) 75 MG tablet Take 1 tablet (75 mg total) by mouth daily. 90 tablet 0   dapagliflozin propanediol (FARXIGA) 10 MG TABS tablet Take 1 tablet (10 mg total) by mouth daily before breakfast. 90 tablet 3   docusate sodium (CVS STOOL SOFTENER) 100 MG capsule Take 100 mg by mouth 3 (three) times a week.     Dulaglutide (TRULICITY) 0.75 MG/0.5ML SOPN Inject 0.75 mg into the skin every Monday. (Patient not taking: Reported on 04/27/2023) 3 mL 0   glipiZIDE (GLUCOTROL XL) 10 MG 24 hr tablet TAKE 1 TABLET BY MOUTH EVERY DAY WITH BREAKFAST (Patient taking differently: Take 10 mg by mouth daily with breakfast.) 90 tablet 2   Glucose Blood (BLOOD GLUCOSE TEST STRIPS) STRP Please dispense as One touch Ultra Blue. Use as directed to monitor FSBS 3x daily. Dx: E11.9. 100 each 4   insulin glargine (LANTUS SOLOSTAR) 100 UNIT/ML Solostar Pen Inject 10 Units into the skin daily. 15 mL PRN   Insulin Pen Needle (BD PEN NEEDLE NANO 2ND GEN) 32G X 4 MM MISC Use to administer insulin daily. 100 each 1   linaclotide (LINZESS) 290 MCG CAPS capsule Take 1 capsule (290 mcg total) by mouth daily before breakfast. 90 capsule 3   No current facility-administered medications on file prior to visit.   Allergies  Allergen Reactions   Tape Other (See Comments)    PLEASE USE EASY-RELEASE TAPE!!!! THE SKIN IS VERY SENSITIVE!!   Losartan Nausea Only and Other (See Comments)    Severe nausea   Social History   Socioeconomic History   Marital status: Single    Spouse name: Nadiene   Number of children: 2   Years of education: Not on file   Highest education level: Not on file  Occupational History   Not on file  Tobacco Use   Smoking status: Former    Current packs/day: 0.00    Types: Cigarettes    Start date: 10/12/1966    Quit date: 10/13/1991    Years since quitting: 31.5   Smokeless tobacco: Never  Vaping Use   Vaping status: Never Used   Substance and Sexual Activity   Alcohol use: No    Comment: occassional glass of wine   Drug use: No   Sexual activity: Not Currently  Other Topics Concern   Not on file  Social History Narrative   2 daughters.    Wife is currently undergoing cancer treatments.    Social Determinants of Health   Financial Resource Strain: Low Risk  (12/11/2022)   Overall Financial Resource Strain (CARDIA)    Difficulty of Paying Living Expenses: Not hard at all  Food Insecurity: No Food Insecurity (12/11/2022)   Hunger Vital Sign    Worried About Running Out of Food in the Last Year: Never true    Ran Out of Food in the Last Year: Never true  Transportation Needs: No Transportation Needs (12/11/2022)   PRAPARE - Transportation    Lack of  Transportation (Medical): No    Lack of Transportation (Non-Medical): No  Physical Activity: Insufficiently Active (12/11/2022)   Exercise Vital Sign    Days of Exercise per Week: 4 days    Minutes of Exercise per Session: 20 min  Stress: Stress Concern Present (12/11/2022)   [employer] of Occupational Health - Occupational Stress Questionnaire    Feeling of Stress : To some extent  Social Connections: Moderately Integrated (12/11/2022)   Social Connection and Isolation Panel [NHANES]    Frequency of Communication with Friends and Family: More than three times a week    Frequency of Social Gatherings with Friends and Family: More than three times a week    Attends Religious Services: 1 to 4 times per year    Active Member of [employer] or Organizations: No    Attends Banker Meetings: 1 to 4 times per year    Marital Status: Widowed  Intimate Partner Violence: Not At Risk (12/11/2022)   Humiliation, Afraid, Rape, and Kick questionnaire    Fear of Current or Ex-Partner: No    Emotionally Abused: No    Physically Abused: No    Sexually Abused: No      Review of Systems  All other systems reviewed and are negative.      Objective:   Physical  Exam Vitals reviewed.  Constitutional:      General: He is not in acute distress.    Appearance: He is well-developed. He is not diaphoretic.  Eyes:     General: Lids are normal.        Right eye: No foreign body.        Left eye: No foreign body.     Extraocular Movements: Extraocular movements intact.     Conjunctiva/sclera:     Right eye: Right conjunctiva is not injected. No chemosis or exudate.    Left eye: Left conjunctiva is not injected. No chemosis or exudate. Neck:     Thyroid: No thyromegaly.     Vascular: No JVD.  Cardiovascular:     Rate and Rhythm: Normal rate and regular rhythm.     Heart sounds: Normal heart sounds. No murmur heard.    No friction rub. No gallop.  Pulmonary:     Effort: Pulmonary effort is normal. No respiratory distress.     Breath sounds: Normal breath sounds. No wheezing or rales.  Abdominal:     General: Bowel sounds are normal. There is no distension.     Palpations: Abdomen is soft. There is no mass.     Tenderness: There is no abdominal tenderness. There is no guarding or rebound.  Musculoskeletal:     Cervical back: Neck supple.     Right knee: Effusion present. No erythema. Tenderness present over the medial joint line and lateral joint line.  Lymphadenopathy:     Cervical: No cervical adenopathy.  Neurological:     Mental Status: He is alert and oriented to person, place, and time.     Cranial Nerves: Facial asymmetry present.     Sensory: Sensation is intact.     Motor: No weakness, atrophy or abnormal muscle tone.     Coordination: Coordination is intact.     Gait: Gait abnormal.           Assessment & Plan:  Cerebrovascular accident (CVA), unspecified mechanism (HCC) - Plan: CBC with Differential/Platelet, COMPLETE METABOLIC PANEL WITH GFR, Lipid panel, Hemoglobin A1c Patient has suffered a stroke in his left thalamus.  Family declines  physical therapy.  Patient is not a surgical candidate for any blockage in his carotid  artery given his age and his frail state.  Therefore we will focus on medical management.  Recommended dual antiplatelet therapy for 3 months then transitioning to Plavix 75 mg a day only at that point.  Therefore we will discontinue aspirin November but continue Plavix.  Start statin, Lipitor 40 mg a day.  Blood pressure is at goal.  Check hemoglobin A1c.  Goal LDL cholesterol is less than 55

## 2023-05-07 LAB — COMPLETE METABOLIC PANEL WITH GFR: BUN/Creatinine Ratio: 22 (calc) (ref 6–22)

## 2023-05-07 LAB — CBC WITH DIFFERENTIAL/PLATELET: MCHC: 32.3 g/dL (ref 32.0–36.0)

## 2023-05-07 MED ORDER — TRIAMCINOLONE ACETONIDE 40 MG/ML IJ SUSP
40.0000 mg | Freq: Once | INTRAMUSCULAR | Status: AC
Start: 2023-05-07 — End: 2023-04-27
  Administered 2023-04-27: 40 mg via INTRA_ARTICULAR

## 2023-05-07 MED ORDER — LIDOCAINE HCL (PF) 1 % IJ SOLN
2.0000 mL | Freq: Once | INTRAMUSCULAR | Status: AC
Start: 2023-05-07 — End: 2023-04-27
  Administered 2023-04-27: 2 mL

## 2023-05-07 MED ORDER — BUPIVACAINE HCL 0.25 % IJ SOLN
2.0000 mL | Freq: Once | INTRAMUSCULAR | Status: AC
Start: 2023-05-07 — End: 2023-04-27
  Administered 2023-04-27: 2 mL via INTRA_ARTICULAR

## 2023-05-07 NOTE — Addendum Note (Signed)
Addended by: Venia Carbon K on: 05/07/2023 04:13 PM   Modules accepted: Orders

## 2023-05-18 ENCOUNTER — Other Ambulatory Visit: Payer: PPO

## 2023-05-18 DIAGNOSIS — Z794 Long term (current) use of insulin: Secondary | ICD-10-CM

## 2023-05-19 DIAGNOSIS — E1169 Type 2 diabetes mellitus with other specified complication: Secondary | ICD-10-CM | POA: Insufficient documentation

## 2023-07-23 ENCOUNTER — Encounter: Payer: Self-pay | Admitting: Family Medicine

## 2023-07-29 ENCOUNTER — Other Ambulatory Visit: Payer: Self-pay

## 2023-07-29 DIAGNOSIS — E118 Type 2 diabetes mellitus with unspecified complications: Secondary | ICD-10-CM

## 2023-08-03 ENCOUNTER — Encounter: Payer: PPO | Admitting: Pharmacist

## 2023-08-05 ENCOUNTER — Other Ambulatory Visit: Payer: Self-pay

## 2023-08-05 NOTE — Telephone Encounter (Signed)
Prescription Request  08/05/2023  LOV: 05/18/23 What is the name of the medication or equipment? glipiZIDE (GLUCOTROL XL) 10 MG 24 hr tablet [[id number]]   Have you contacted your pharmacy to request a refill? Yes   Which pharmacy would you like this sent to?  [id number] [place], [place] - [address] [place] [address] Phone: [phone] Fax: [phone]    Patient notified that their request is being sent to the clinical staff for review and that they should receive a response within 2 business days.   Please advise at [facility] [phone]

## 2023-08-06 MED ORDER — GLIPIZIDE ER 10 MG PO TB24: ORAL_TABLET | ORAL | 0 refills | Status: DC

## 2023-08-06 NOTE — Telephone Encounter (Signed)
Last OV 05/05/23 within protocol.  Requested Prescriptions  Pending Prescriptions Disp Refills   glipiZIDE (GLUCOTROL XL) 10 MG 24 hr tablet 90 tablet 2    Sig: TAKE 1 TABLET BY MOUTH EVERY DAY WITH BREAKFAST     Endocrinology:  Diabetes - Sulfonylureas Failed - 08/05/2023  8:48 AM      Failed - HBA1C is between 0 and 7.9 and within 180 days    Hgb A1c MFr Bld  Date Value Ref Range Status  05/06/2023 8.9 (H) <5.7 % of total Hgb Final    Comment:    For someone without known diabetes, a hemoglobin A1c value of 6.5% or greater indicates that they may have  diabetes and this should be confirmed with a follow-up  test. . For someone with known diabetes, a value <7% indicates  that their diabetes is well controlled and a value  greater than or equal to 7% indicates suboptimal  control. A1c targets should be individualized based on  duration of diabetes, age, comorbid conditions, and  other considerations. . Currently, no consensus exists regarding use of hemoglobin A1c for diagnosis of diabetes for children. .          Failed - Cr in normal range and within 360 days    Creat  Date Value Ref Range Status  05/18/2023 1.66 (H) 0.70 - 1.22 mg/dL Final         Failed - Valid encounter within last 6 months    Recent Outpatient Visits           2 years ago Blurry vision, bilateral   [facility] [name], Priscille Heidelberg, MD   2 years ago Constipation, unspecified constipation type   [facility] Cathlean Marseilles A, NP   3 years ago Uncontrolled type 2 diabetes mellitus with hyperosmolarity without coma, with long-term current use of insulin (HCC)   [facility] Donita Brooks, MD   3 years ago Type 2 diabetes mellitus with hyperglycemia, without long-term current use of insulin (HCC)   [facility] Donita Brooks, MD   3 years ago Uncontrolled type 2 diabetes mellitus with hyperosmolarity without coma, with long-term  current use of insulin (HCC)   [facility] [name], Priscille Heidelberg, MD

## 2023-09-28 ENCOUNTER — Other Ambulatory Visit: Payer: Self-pay | Admitting: Family Medicine

## 2023-09-28 DIAGNOSIS — I639 Cerebral infarction, unspecified: Secondary | ICD-10-CM

## 2023-09-28 MED ORDER — CLOPIDOGREL BISULFATE 75 MG PO TABS: 75.0000 mg | ORAL_TABLET | Freq: Every day | ORAL | 0 refills | Status: DC

## 2023-10-23 ENCOUNTER — Other Ambulatory Visit: Payer: Self-pay | Admitting: Family Medicine

## 2023-10-26 NOTE — Telephone Encounter (Signed)
 Requested medications are due for refill today.  yes  Requested medications are on the active medications list.  yes  Last refill. 04/13/2023 #90 1 rf  Future visit scheduled.   no  Notes to clinic.  Pt has been going to [facility].     Requested Prescriptions  Pending Prescriptions Disp Refills   atenolol  (TENORMIN ) 50 MG tablet [Pharmacy Med Name: ATENOLOL  50 MG TABLET] 90 tablet 1    Sig: TAKE 1 TABLET BY MOUTH EVERY DAY     Cardiovascular: Beta Blockers 2 Failed - 10/26/2023  7:46 AM      Failed - Cr in normal range and within 360 days    Creat  Date Value Ref Range Status  05/18/2023 1.66 (H) 0.70 - 1.22 mg/dL Final         Failed - Valid encounter within last 6 months    Recent Outpatient Visits           2 years ago Blurry vision, bilateral   [facility] [name], Butler DASEN, MD   3 years ago Constipation, unspecified constipation type   [facility] Chandra Raisin A, NP   3 years ago Uncontrolled type 2 diabetes mellitus with hyperosmolarity without coma, with long-term current use of insulin  (HCC)   [facility] Duanne Butler DASEN, MD   3 years ago Type 2 diabetes mellitus with hyperglycemia, without long-term current use of insulin  (HCC)   [facility] Duanne Butler DASEN, MD   3 years ago Uncontrolled type 2 diabetes mellitus with hyperosmolarity without coma, with long-term current use of insulin  (HCC)   [facility] [name], Butler DASEN, MD              Passed - Last BP in normal range    BP Readings from Last 1 Encounters:  05/06/23 128/70         Passed - Last Heart Rate in normal range    Pulse Readings from Last 1 Encounters:  05/06/23 67

## 2023-11-01 ENCOUNTER — Ambulatory Visit (INDEPENDENT_AMBULATORY_CARE_PROVIDER_SITE_OTHER): Payer: PPO | Admitting: Family Medicine

## 2023-11-01 VITALS — BP 124/72 | HR 69 | Ht 63.0 in | Wt 162.0 lb

## 2023-11-01 DIAGNOSIS — R634 Abnormal weight loss: Secondary | ICD-10-CM | POA: Diagnosis not present

## 2023-11-01 MED ORDER — TRAZODONE HCL 50 MG PO TABS: 50.0000 mg | ORAL_TABLET | Freq: Every day | ORAL | 3 refills | Status: AC

## 2023-11-01 MED ORDER — LANTUS SOLOSTAR 100 UNIT/ML ~~LOC~~ SOPN: 15.0000 [IU] | PEN_INJECTOR | Freq: Every day | SUBCUTANEOUS | 99 refills | Status: DC

## 2023-11-01 NOTE — Progress Notes (Signed)
Wt Readings from Last 3 Encounters:  11/01/23 162 lb (73.5 kg)  05/06/23 167 lb 11.2 oz (76.1 kg)  04/27/23 167 lb (75.8 kg)     Subjective:    Patient ID: [name], male    DOB: Aug 05, 1931, 89 y.o.   MRN: [id number] Patient is a 89 year old Caucasian gentleman with a history of diabetes mellitus poorly controlled due to noncompliance, previous history of stroke and carotid artery disease who presents today with weight loss.  Patient has lost approximately 5 pounds over the last 6 months.  He denies any change in his appetite.  Due to his age she is relatively sedentary.  Daughter states that he has a good appetite.  He denies any fevers or chills.  He denies any cough or chest pain or hemoptysis or pleurisy.  He denies any abdominal pain or nausea or vomiting or melena.  He denies any body aches.  He denies any hematuria or dysuria.  He denies any bone pain.  He does complain of trouble sleeping.  Daughter states that he cannot fall asleep.  She has been giving him Benadryl to help him sleep at night with no altered mental status or confusion or delirium. Past Medical History:  Diagnosis Date   Arthritis    knee   Carotid artery occlusion    Diabetes mellitus    metformin,januvia,and glipizide daily   Enlarged prostate    pt states no trouble with it   History of COVID-19 08/2020   Hyperlipidemia    takes Simvastatin daily   Hypertension    takes Atenolol and Lisinopril daily   Impaired hearing, left    but doesn't wear hearing aids   Seasonal allergies    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   Past Surgical History:  Procedure Laterality Date   APPENDECTOMY     as a child   CAROTID ANGIOGRAM N/A 05/09/2013   Procedure: CAROTID ANGIOGRAM;  Surgeon: Nada Libman, MD;  Location: [facility] CATH LAB;  Service: Cardiovascular;  Laterality: N/A;   CAROTID ENDARTERECTOMY Right 2008    Re-do Mar 04, 2012   carotidendartectomy  2008   right side   CATARACT EXTRACTION  W/PHACO Left 04/08/2021   Procedure: CATARACT EXTRACTION PHACO AND INTRAOCULAR LENS PLACEMENT (IOC) LEFT DIABETIC 15.64 01:28.9;  Surgeon: Galen Manila, MD;  Location[facility];  Service: Ophthalmology;  Laterality: Left;   CATARACT EXTRACTION W/PHACO Right 04/22/2021   Procedure: CATARACT EXTRACTION PHACO AND INTRAOCULAR LENS PLACEMENT (IOC) RIGHT DIABETIC 12.52 01:16.4;  Surgeon: Galen Manila, MD;  Location: [facility];  Service: Ophthalmology;  Laterality: Right;   ENDARTERECTOMY  03/04/2012   Procedure: ENDARTERECTOMY CAROTID;  Surgeon: Larina Earthly, MD;  Location: [facility] OR;  Service: Vascular;  Laterality: Right;  Redo Right Carotid Endarterectomy with hemasheild patch angioplasty   ENDARTERECTOMY Right 06/26/2020   Procedure: REDO CAROTID ENDARTERECTOMY;  Surgeon: Larina Earthly, MD;  Location: [facility] OR;  Service: Vascular;  Laterality: Right;   WOUND EXPLORATION Right 06/26/2020   Procedure: EXPLORATION OF RIGHT NECK INCISION;  Surgeon: Larina Earthly, MD;  Location: [facility] OR;  Service: Vascular;  Laterality: Right;   Current Outpatient Medications on File Prior to Visit  Medication Sig Dispense Refill   atenolol (TENORMIN) 50 MG tablet Take 1 tablet (50 mg total) by mouth daily. 90 tablet 1   atorvastatin (LIPITOR) 40 MG tablet Take 1 tablet (40 mg total) by mouth daily. 90 tablet 3   clopidogrel (PLAVIX) 75  MG tablet Take 1 tablet (75 mg total) by mouth daily. 90 tablet 0   dapagliflozin propanediol (FARXIGA) 10 MG TABS tablet Take 1 tablet (10 mg total) by mouth daily before breakfast. 90 tablet 3   glipiZIDE (GLUCOTROL XL) 10 MG 24 hr tablet TAKE 1 TABLET BY MOUTH EVERY DAY WITH BREAKFAST 90 tablet 0   Glucose Blood (BLOOD GLUCOSE TEST STRIPS) STRP Please dispense as One touch Ultra Blue. Use as directed to monitor FSBS 3x daily. Dx: E11.9. 100 each 4   Insulin Pen Needle (BD PEN NEEDLE NANO 2ND GEN) 32G X 4 MM MISC Use to administer insulin daily. 100 each 1    linaclotide (LINZESS) 290 MCG CAPS capsule Take 1 capsule (290 mcg total) by mouth daily before breakfast. 90 capsule 3   albuterol (VENTOLIN HFA) 108 (90 Base) MCG/ACT inhaler Inhale 2 puffs into the lungs every 6 (six) hours as needed for wheezing or shortness of breath. (Patient not taking: Reported on 11/01/2023) 6.7 g 0   aspirin EC (ASPIRIN LOW DOSE) 81 MG tablet Take 1 tablet (81 mg total) by mouth daily. Swallow whole. (Patient not taking: Reported on 11/01/2023) 90 tablet 1   No current facility-administered medications on file prior to visit.   Allergies  Allergen Reactions   Tape Other (See Comments)    PLEASE USE EASY-RELEASE TAPE!!!! THE SKIN IS VERY SENSITIVE!!   Losartan Nausea Only and Other (See Comments)    Severe nausea   Social History   Socioeconomic History   Marital status: Single    Spouse name: Nadiene   Number of children: 2   Years of education: Not on file   Highest education level: Not on file  Occupational History   Not on file  Tobacco Use   Smoking status: Former    Current packs/day: 0.00    Types: Cigarettes    Start date: 10/12/1966    Quit date: 10/13/1991    Years since quitting: 32.0   Smokeless tobacco: Never  Vaping Use   Vaping status: Never Used  Substance and Sexual Activity   Alcohol use: No    Comment: occassional glass of wine   Drug use: No   Sexual activity: Not Currently  Other Topics Concern   Not on file  Social History Narrative   2 daughters.    Wife is currently undergoing cancer treatments.    Social Drivers of Corporate investment banker Strain: Low Risk  (12/11/2022)   Overall Financial Resource Strain (CARDIA)    Difficulty of Paying Living Expenses: Not hard at all  Food Insecurity: No Food Insecurity (12/11/2022)   Hunger Vital Sign    Worried About Running Out of Food in the Last Year: Never true    Ran Out of Food in the Last Year: Never true  Transportation Needs: No Transportation Needs (12/11/2022)   PRAPARE -  Administrator, Civil Service (Medical): No    Lack of Transportation (Non-Medical): No  Physical Activity: Insufficiently Active (12/11/2022)   Exercise Vital Sign    Days of Exercise per Week: 4 days    Minutes of Exercise per Session: 20 min  Stress: Stress Concern Present (12/11/2022)   [employer] of Occupational Health - Occupational Stress Questionnaire    Feeling of Stress : To some extent  Social Connections: Moderately Integrated (12/11/2022)   Social Connection and Isolation Panel [NHANES]    Frequency of Communication with Friends and Family: More than three times a week  Frequency of Social Gatherings with Friends and Family: More than three times a week    Attends Religious Services: 1 to 4 times per year    Active Member of Clubs or Organizations: No    Attends Banker Meetings: 1 to 4 times per year    Marital Status: Widowed  Intimate Partner Violence: Not At Risk (12/11/2022)   Humiliation, Afraid, Rape, and Kick questionnaire    Fear of Current or Ex-Partner: No    Emotionally Abused: No    Physically Abused: No    Sexually Abused: No      Review of Systems  All other systems reviewed and are negative.      Objective:   Physical Exam Vitals reviewed.  Constitutional:      General: He is not in acute distress.    Appearance: Normal appearance. He is well-developed and normal weight. He is not ill-appearing, toxic-appearing or diaphoretic.  Eyes:     General: Lids are normal.        Right eye: No foreign body.        Left eye: No foreign body.     Extraocular Movements: Extraocular movements intact.     Conjunctiva/sclera:     Right eye: Right conjunctiva is not injected. No chemosis or exudate.    Left eye: Left conjunctiva is not injected. No chemosis or exudate. Neck:     Thyroid: No thyromegaly.     Vascular: No JVD.  Cardiovascular:     Rate and Rhythm: Normal rate and regular rhythm.     Heart sounds: Normal heart  sounds. No murmur heard.    No friction rub. No gallop.  Pulmonary:     Effort: Pulmonary effort is normal. No respiratory distress.     Breath sounds: Normal breath sounds. No wheezing or rales.  Abdominal:     General: Bowel sounds are normal. There is no distension.     Palpations: Abdomen is soft. There is no mass.     Tenderness: There is no abdominal tenderness. There is no guarding or rebound.  Musculoskeletal:     Cervical back: Neck supple.     Right knee: Effusion present. No erythema. Tenderness present over the medial joint line and lateral joint line.     Right lower leg: No edema.     Left lower leg: No edema.  Lymphadenopathy:     Cervical: No cervical adenopathy.  Neurological:     General: No focal deficit present.     Mental Status: He is alert and oriented to person, place, and time. Mental status is at baseline.     Sensory: Sensation is intact.     Motor: No weakness, atrophy or abnormal muscle tone.     Coordination: Coordination is intact.     Gait: Gait abnormal.           Assessment & Plan:  Weight loss - Plan: CBC with Differential/Platelet, COMPLETE METABOLIC PANEL WITH GFR, TSH, PSA, Hemoglobin A1c Patient's exam today is unremarkable.  5 pound weight loss in the last 6 months could be due to any number of reasons however his review of systems is relatively normal.  Begin by checking baseline lab work including a CBC a CMP a TSH PSA and a hemoglobin A1c to determine if there is any abnormalities such as uncontrolled diabetes, hyperthyroidism, prostate cancer, or electrolyte disturbances that could explain the weight loss.  If lab work is unremarkable I would attribute the weight loss to his  age and lack of physical activity.  We discussed the risk of delirium and falls due to sleeping pills.  I recommended against Benadryl.  However they respectfully request something to sleep.  Therefore we will try trazodone 50 mg p.o. nightly and monitor for delirium or  confusion

## 2023-11-02 LAB — COMPLETE METABOLIC PANEL WITH GFR
AG Ratio: 1.6 (calc) (ref 1.0–2.5)
ALT: 15 U/L (ref 9–46)
AST: 12 U/L (ref 10–35)
Albumin: 4 g/dL (ref 3.6–5.1)
Alkaline phosphatase (APISO): 99 U/L (ref 35–144)
BUN/Creatinine Ratio: 17 (calc) (ref 6–22)
BUN: 27 mg/dL — ABNORMAL HIGH (ref 7–25)
CO2: 21 mmol/L (ref 20–32)
Calcium: 9.6 mg/dL (ref 8.6–10.3)
Chloride: 106 mmol/L (ref 98–110)
Creat: 1.56 mg/dL — ABNORMAL HIGH (ref 0.70–1.22)
Globulin: 2.5 g/dL (ref 1.9–3.7)
Glucose, Bld: 274 mg/dL — ABNORMAL HIGH (ref 65–99)
Potassium: 5.6 mmol/L — ABNORMAL HIGH (ref 3.5–5.3)
Sodium: 137 mmol/L (ref 135–146)
Total Bilirubin: 1.3 mg/dL — ABNORMAL HIGH (ref 0.2–1.2)
Total Protein: 6.5 g/dL (ref 6.1–8.1)
eGFR: 42 mL/min/{1.73_m2} — ABNORMAL LOW (ref >=60)

## 2023-11-02 LAB — CBC WITH DIFFERENTIAL/PLATELET
Absolute Lymphocytes: 1776 {cells}/uL (ref 850–3900)
Absolute Monocytes: 323 {cells}/uL (ref 200–950)
Basophils Absolute: 48 {cells}/uL (ref 0–200)
Basophils Relative: 0.9 %
Eosinophils Absolute: 143 {cells}/uL (ref 15–500)
Eosinophils Relative: 2.7 %
HCT: 40.8 % (ref 38.5–50.0)
Hemoglobin: 13 g/dL — ABNORMAL LOW (ref 13.2–17.1)
MCH: 29.5 pg (ref 27.0–33.0)
MCHC: 31.9 g/dL — ABNORMAL LOW (ref 32.0–36.0)
MCV: 92.5 fL (ref 80.0–100.0)
MPV: 12.6 fL — ABNORMAL HIGH (ref 7.5–12.5)
Monocytes Relative: 6.1 %
Neutro Abs: 3010 {cells}/uL (ref 1500–7800)
Neutrophils Relative %: 56.8 %
Platelets: 257 10*3/uL (ref 140–400)
RBC: 4.41 10*6/uL (ref 4.20–5.80)
RDW: 13.8 % (ref 11.0–15.0)
Total Lymphocyte: 33.5 %
WBC: 5.3 10*3/uL (ref 3.8–10.8)

## 2023-11-02 LAB — HEMOGLOBIN A1C
Hgb A1c MFr Bld: 6.4 %{Hb} — ABNORMAL HIGH (ref <5.7)
Mean Plasma Glucose: 137 mg/dL
eAG (mmol/L): 7.6 mmol/L

## 2023-11-02 LAB — PSA: PSA: 0.25 ng/mL (ref <=4.00)

## 2023-11-02 LAB — TSH: TSH: 3.83 m[IU]/L (ref 0.40–4.50)

## 2023-11-04 ENCOUNTER — Ambulatory Visit: Payer: PPO | Admitting: Family Medicine

## 2023-11-25 ENCOUNTER — Encounter: Payer: Self-pay | Admitting: Family Medicine

## 2023-12-21 ENCOUNTER — Encounter: Payer: Self-pay | Admitting: Family Medicine

## 2023-12-21 ENCOUNTER — Ambulatory Visit (INDEPENDENT_AMBULATORY_CARE_PROVIDER_SITE_OTHER): Admitting: Family Medicine

## 2023-12-21 VITALS — BP 124/82 | HR 77 | Temp 98.5°F | Ht 63.0 in | Wt 162.5 lb

## 2023-12-21 DIAGNOSIS — I6523 Occlusion and stenosis of bilateral carotid arteries: Secondary | ICD-10-CM

## 2023-12-21 DIAGNOSIS — R5383 Other fatigue: Secondary | ICD-10-CM | POA: Diagnosis not present

## 2023-12-21 DIAGNOSIS — R040 Epistaxis: Secondary | ICD-10-CM | POA: Insufficient documentation

## 2023-12-21 DIAGNOSIS — I6930 Unspecified sequelae of cerebral infarction: Secondary | ICD-10-CM | POA: Diagnosis not present

## 2023-12-21 DIAGNOSIS — I6389 Other cerebral infarction: Secondary | ICD-10-CM

## 2023-12-21 NOTE — Progress Notes (Signed)
 Patient Office Visit  Assessment & Plan:  Epistaxis  Cerebrovascular accident (CVA) due to other mechanism (HCC)  Other fatigue -     CBC with Differential/Platelet -     COMPLETE METABOLIC PANEL WITH GFR -     TSH -     VITAMIN D 25 Hydroxy (Vit-D Deficiency, Fractures) -     Vitamin B12  Bilateral carotid artery stenosis   Patient will reduce the trazodone to 25 mg nightly.  Patient will take this one hour prior to bedtime.  Patient will use over-the-counter saline to avoid dryness of the nasal passages.  If no improvement they will notify us.  At this point patient will stay on Plavix given his risk factors of carotid stenosis and previous stroke.  No follow-ups on file.   Subjective:    Patient ID: [name], male    DOB: 1931/08/16  Age: 89 y.o. MRN: [id number]  Chief Complaint  Patient presents with   Fatigue    X 2 days.   Epistaxis    Nose bleed today. Has no hx of nose bleeds.    HPI Epistaxis-Per daughter patient had a nosebleed from the left nostril earlier today.  Lasted few minutes or less and easily stopped with pressure.  Daughter was concerned since he has been on Plavix for the past year.  Daughter is not sure if he picked his nose or not.  Patient has been having some congestion and may be having nasal dryness.  Patient does have a history of carotid stenosis/previous endarterectomy but has 90% occlusion on the right side.  Patient does have upcoming appointment with vascular this month.  No surgery recommended due to patient's age/risk factors. Patient also has previous history of a stroke last year Fatigue-patient has been more fatigued the last 2 to 3 days.  Daughter is not sure if it is because he does not sleep or because he has lower energy due to his age. Patient does not have much of an appetite. Daughter has started giving him a protein drink.  Weight has been stable since the last office visit. Patient lost his wife and grandchild within 2  months of each other.   Insomnia-patient took trazodone 50 mg at night but felt very tired the next day so stopped taking it.  Patient has not tried a half a tablet yet.   The ASCVD Risk score (Arnett DK, et al., 2019) failed to calculate for the following reasons:   The 2019 ASCVD risk score is only valid for ages 59 to 101   Risk score cannot be calculated because patient has a medical history suggesting prior/existing ASCVD  Past Medical History:  Diagnosis Date   Arthritis    knee   Carotid artery occlusion    Diabetes mellitus    metformin,januvia,and glipizide daily   Enlarged prostate    pt states no trouble with it   History of COVID-19 08/2020   Hyperlipidemia    takes Simvastatin daily   Hypertension    takes Atenolol and Lisinopril daily   Impaired hearing, left    but doesn't wear hearing aids   Seasonal allergies    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   Past Surgical History:  Procedure Laterality Date   APPENDECTOMY     as a child   CAROTID ANGIOGRAM N/A 05/09/2013   Procedure: CAROTID ANGIOGRAM;  Surgeon: Nada Libman, MD;  Location: [facility] CATH LAB;  Service: Cardiovascular;  Laterality: N/A;  CAROTID ENDARTERECTOMY Right 2008    Re-do Mar 04, 2012   carotidendartectomy  2008   right side   CATARACT EXTRACTION W/PHACO Left 04/08/2021   Procedure: CATARACT EXTRACTION PHACO AND INTRAOCULAR LENS PLACEMENT (IOC) LEFT DIABETIC 15.64 01:28.9;  Surgeon: Galen Manila, MD;  Location: [facility];  Service: Ophthalmology;  Laterality: Left;   CATARACT EXTRACTION W/PHACO Right 04/22/2021   Procedure: CATARACT EXTRACTION PHACO AND INTRAOCULAR LENS PLACEMENT (IOC) RIGHT DIABETIC 12.52 01:16.4;  Surgeon: Galen Manila, MD;  Location: [facility];  Service: Ophthalmology;  Laterality: Right;   ENDARTERECTOMY  03/04/2012   Procedure: ENDARTERECTOMY CAROTID;  Surgeon: Larina Earthly, MD;  Location: [facility] OR;  Service: Vascular;  Laterality: Right;   Redo Right Carotid Endarterectomy with hemasheild patch angioplasty   ENDARTERECTOMY Right 06/26/2020   Procedure: REDO CAROTID ENDARTERECTOMY;  Surgeon: Larina Earthly, MD;  Location: [facility] OR;  Service: Vascular;  Laterality: Right;   WOUND EXPLORATION Right 06/26/2020   Procedure: EXPLORATION OF RIGHT NECK INCISION;  Surgeon: Larina Earthly, MD;  Location: [facility] OR;  Service: Vascular;  Laterality: Right;   Social History   Tobacco Use   Smoking status: Former    Current packs/day: 0.00    Types: Cigarettes    Start date: 10/12/1966    Quit date: 10/13/1991    Years since quitting: 32.2   Smokeless tobacco: Never  Vaping Use   Vaping status: Never Used  Substance Use Topics   Alcohol use: No    Comment: occassional glass of wine   Drug use: No   Family History  Problem Relation Age of Onset   Leukemia Mother    Asthma Father    Anesthesia problems Neg Hx    Hypotension Neg Hx    Malignant hyperthermia Neg Hx    Pseudochol deficiency Neg Hx    Colon cancer Neg Hx    Stomach cancer Neg Hx    Esophageal cancer Neg Hx    Pancreatic cancer Neg Hx    Allergies  Allergen Reactions   Tape Other (See Comments)    PLEASE USE EASY-RELEASE TAPE!!!! THE SKIN IS VERY SENSITIVE!!   Losartan Nausea Only and Other (See Comments)    Severe nausea    ROS    Objective:    BP 124/82   Pulse 77   Temp 98.5 F (36.9 C)   Ht 5\' 3"  (1.6 m)   Wt 162 lb 8 oz (73.7 kg)   SpO2 99%   BMI 28.79 kg/m  BP Readings from Last 3 Encounters:  12/21/23 124/82  11/01/23 124/72  05/06/23 128/70   Wt Readings from Last 3 Encounters:  12/21/23 162 lb 8 oz (73.7 kg)  11/01/23 162 lb (73.5 kg)  05/06/23 167 lb 11.2 oz (76.1 kg)    Physical Exam Vitals and nursing note reviewed.  Constitutional:      Appearance: Normal appearance.     Comments: Patient comes in with daughter, is very hard of hearing  HENT:     Head: Normocephalic.     Right Ear: Tympanic membrane, ear canal and external ear  normal.     Left Ear: Tympanic membrane, ear canal and external ear normal.     Nose: Congestion present.     Right Nostril: No occlusion.     Left Nostril: Epistaxis present.     Comments: Left nares-  pt has tiny clot left side, no active bleeding noted.  Eyes:     Extraocular Movements: Extraocular movements intact.  Conjunctiva/sclera: Conjunctivae normal.     Pupils: Pupils are equal, round, and reactive to light.  Cardiovascular:     Rate and Rhythm: Normal rate and regular rhythm.     Heart sounds: Normal heart sounds.  Pulmonary:     Effort: Pulmonary effort is normal.     Breath sounds: Normal breath sounds.  Musculoskeletal:     Right lower leg: No edema.     Left lower leg: No edema.  Neurological:     General: No focal deficit present.     Mental Status: He is alert and oriented to person, place, and time.  Psychiatric:        Mood and Affect: Mood normal.        Behavior: Behavior normal.      No results found for any visits on 12/21/23.

## 2023-12-22 ENCOUNTER — Encounter: Payer: Self-pay | Admitting: Family Medicine

## 2023-12-22 LAB — CBC WITH DIFFERENTIAL/PLATELET
Absolute Lymphocytes: 2932 {cells}/uL (ref 850–3900)
Absolute Monocytes: 1008 {cells}/uL — ABNORMAL HIGH (ref 200–950)
Basophils Absolute: 42 {cells}/uL (ref 0–200)
Basophils Relative: 0.5 %
Eosinophils Absolute: 244 {cells}/uL (ref 15–500)
Eosinophils Relative: 2.9 %
HCT: 46.7 % (ref 38.5–50.0)
Hemoglobin: 15 g/dL (ref 13.2–17.1)
MCH: 30.3 pg (ref 27.0–33.0)
MCHC: 32.1 g/dL (ref 32.0–36.0)
MCV: 94.3 fL (ref 80.0–100.0)
MPV: 9.5 fL (ref 7.5–12.5)
Monocytes Relative: 12 %
Neutro Abs: 4175 {cells}/uL (ref 1500–7800)
Neutrophils Relative %: 49.7 %
Platelets: 215 10*3/uL (ref 140–400)
RBC: 4.95 10*6/uL (ref 4.20–5.80)
RDW: 12.3 % (ref 11.0–15.0)
Total Lymphocyte: 34.9 %
WBC: 8.4 10*3/uL (ref 3.8–10.8)

## 2023-12-22 LAB — COMPLETE METABOLIC PANEL WITH GFR
AG Ratio: 2 (calc) (ref 1.0–2.5)
ALT: 17 U/L (ref 9–46)
AST: 13 U/L (ref 10–35)
Albumin: 4.3 g/dL (ref 3.6–5.1)
Alkaline phosphatase (APISO): 95 U/L (ref 35–144)
BUN/Creatinine Ratio: 37 (calc) — ABNORMAL HIGH (ref 6–22)
BUN: 52 mg/dL — ABNORMAL HIGH (ref 7–25)
CO2: 21 mmol/L (ref 20–32)
Calcium: 9.5 mg/dL (ref 8.6–10.3)
Chloride: 108 mmol/L (ref 98–110)
Creat: 1.42 mg/dL — ABNORMAL HIGH (ref 0.70–1.22)
Globulin: 2.2 g/dL (ref 1.9–3.7)
Glucose, Bld: 202 mg/dL — ABNORMAL HIGH (ref 65–99)
Potassium: 5.1 mmol/L (ref 3.5–5.3)
Sodium: 138 mmol/L (ref 135–146)
Total Bilirubin: 0.8 mg/dL (ref 0.2–1.2)
Total Protein: 6.5 g/dL (ref 6.1–8.1)
eGFR: 47 mL/min/{1.73_m2} — ABNORMAL LOW (ref >=60)

## 2023-12-22 LAB — TSH: TSH: 1.73 m[IU]/L (ref 0.40–4.50)

## 2023-12-22 LAB — VITAMIN D 25 HYDROXY (VIT D DEFICIENCY, FRACTURES): Vit D, 25-Hydroxy: 38 ng/mL (ref 30–100)

## 2023-12-22 LAB — VITAMIN B12: Vitamin B-12: 414 pg/mL (ref 200–1100)

## 2023-12-26 ENCOUNTER — Other Ambulatory Visit: Payer: Self-pay | Admitting: Family Medicine

## 2023-12-26 DIAGNOSIS — I639 Cerebral infarction, unspecified: Secondary | ICD-10-CM

## 2023-12-27 ENCOUNTER — Other Ambulatory Visit: Payer: Self-pay

## 2023-12-27 DIAGNOSIS — I6523 Occlusion and stenosis of bilateral carotid arteries: Secondary | ICD-10-CM

## 2023-12-28 NOTE — Telephone Encounter (Signed)
 Requested medication (s) are due for refill today: yes  Requested medication (s) are on the active medication list: yes  Last refill:  09/28/23 #90  Future visit scheduled: yes  Notes to clinic:  abnormal lab work    Requested Prescriptions  Pending Prescriptions Disp Refills   clopidogrel (PLAVIX) 75 MG tablet [Pharmacy Med Name: CLOPIDOGREL 75 MG TABLET] 90 tablet 0    Sig: TAKE 1 TABLET BY MOUTH EVERY DAY     Hematology: Antiplatelets - clopidogrel Failed - 12/28/2023  9:31 AM      Failed - Cr in normal range and within 360 days    Creat  Date Value Ref Range Status  12/21/2023 1.42 (H) 0.70 - 1.22 mg/dL Final         Failed - Valid encounter within last 6 months    Recent Outpatient Visits           3 years ago Blurry vision, bilateral   [facility] [name], Priscille Heidelberg, MD   3 years ago Constipation, unspecified constipation type   [facility] Valentino Nose, NP   3 years ago Uncontrolled type 2 diabetes mellitus with hyperosmolarity without coma, with long-term current use of insulin (HCC)   [facility] [name], Priscille Heidelberg, MD   3 years ago Type 2 diabetes mellitus with hyperglycemia, without long-term current use of insulin (HCC)   [facility] [name], Priscille Heidelberg, MD   3 years ago Uncontrolled type 2 diabetes mellitus with hyperosmolarity without coma, with long-term current use of insulin (HCC)   [facility] [name], Priscille Heidelberg, MD              Passed - HCT in normal range and within 180 days    HCT  Date Value Ref Range Status  12/21/2023 46.7 38.5 - 50.0 % Final         Passed - HGB in normal range and within 180 days    Hemoglobin  Date Value Ref Range Status  12/21/2023 15.0 13.2 - 17.1 g/dL Final         Passed - PLT in normal range and within 180 days    Platelets  Date Value Ref Range Status  12/21/2023 215 140 - 400 Thousand/uL Final

## 2024-01-03 NOTE — Progress Notes (Unsigned)
 [facility] [place]  ASSESSMENT / PLAN: [name] is a 89 y.o. male with asymptomatic left 60 - 79 % carotid artery stenosis. History of right common-internal carotid artery bypass with GSV.  The patient should continue best medical therapy for carotid artery stenosis including: Complete cessation from all tobacco products. Blood glucose control with goal A1c < 7%. Blood pressure control with goal blood pressure < 140/90 mmHg. Lipid reduction therapy with goal LDL-C <100 mg/dL (<16 if symptomatic from carotid artery stenosis).  Aspirin 81mg  PO QD.  Clopidogrel 75 mg by mouth daily Atorvastatin 40-80mg  PO QD (or other "high intensity" statin therapy).  He is at the threshold for asymptomatic intervention. I think this is relatively contraindicated because of his advanced age and frailty. Follow up with me as needed  CHIEF COMPLAINT: carotid stenosis  HISTORY OF PRESENT ILLNESS: [name] is a 89 y.o. male well known to our service who returns to discuss left carotid artery stenosis. He has a strong history of cerebrovascular disease (see below). His primary care physician has been monitoring his carotid stenosis and identified progression of left carotid artery stenosis. He has had multiple interventions on his right carotid artery.   12/08/22: patient returns to clinic. No symptoms from carotid stadnpoint. He is not interested in any surgical intervention. I reviewed his duplex findings today. I counseled him and his daughter about the relatively mild absolute risk reduction for surgery (6%). I recommended watchful waiting. They are in agreement.   01/04/2024: patient returns to clinic. No symptoms from carotid standpoint. He is not interested in any surgical intervention. I reviewed his duplex findings today.   VASCULAR SURGICAL HISTORY:  Right carotid endarterectomy Right re-do carotid endarterectomy 03/04/12 Carotid and cerebral angiogram  05/09/13 Right common carotid to internal carotid artery bypass with greater saphenous vein 06/26/20  Past Medical History:  Diagnosis Date   Arthritis    knee   Carotid artery occlusion    Diabetes mellitus    metformin,januvia,and glipizide daily   Enlarged prostate    pt states no trouble with it   History of COVID-19 08/2020   Hyperlipidemia    takes Simvastatin daily   Hypertension    takes Atenolol and Lisinopril daily   Impaired hearing, left    but doesn't wear hearing aids   Seasonal allergies    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    Past Surgical History:  Procedure Laterality Date   APPENDECTOMY     as a child   CAROTID ANGIOGRAM N/A 05/09/2013   Procedure: CAROTID ANGIOGRAM;  Surgeon: Nada Libman, MD;  Location: [facility] C[facility] LAB;  Service: Cardiovascular;  Laterality: N/A;   CAROTID ENDARTERECTOMY Right 2008    Re-do Mar 04, 2012   carotidendartectomy  2008   right side   CATARACT EXTRACTION W/PHACO Left 04/08/2021   Procedure: CATARACT EXTRACTION PHACO AND INTRAOCULAR LENS PLACEMENT (IOC) LEFT DIABETIC 15.64 01:28.9;  Surgeon: Galen Manila, MD;  Location: [facility];  Service: Ophthalmology;  Laterality: Left;   CATARACT EXTRACTION W/PHACO Right 04/22/2021   Procedure: CATARACT EXTRACTION PHACO AND INTRAOCULAR LENS PLACEMENT (IOC) RIGHT DIABETIC 12.52 01:16.4;  Surgeon: Galen Manila, MD;  Location: [facility];  Service: Ophthalmology;  Laterality: Right;   ENDARTERECTOMY  03/04/2012   Procedure: ENDARTERECTOMY CAROTID;  Surgeon: Larina Earthly, MD;  Location: [facility] OR;  Service: Vascular;  Laterality: Right;  Redo Right Carotid Endarterectomy with hemasheild patch angioplasty   ENDARTERECTOMY Right  06/26/2020   Procedure: REDO CAROTID ENDARTERECTOMY;  Surgeon: Larina Earthly, MD;  Location: [facility] OR;  Service: Vascular;  Laterality: Right;   WOUND EXPLORATION Right 06/26/2020   Procedure: EXPLORATION OF RIGHT NECK INCISION;  Surgeon: Larina Earthly, MD;  Location: [facility] OR;  Service: Vascular;  Laterality: Right;    Family History  Problem Relation Age of Onset   Leukemia Mother    Asthma Father    Anesthesia problems Neg Hx    Hypotension Neg Hx    Malignant hyperthermia Neg Hx    Pseudochol deficiency Neg Hx    Colon cancer Neg Hx    Stomach cancer Neg Hx    Esophageal cancer Neg Hx    Pancreatic cancer Neg Hx     Social History   Socioeconomic History   Marital status: Single    Spouse name: Nadiene   Number of children: 2   Years of education: Not on file   Highest education level: Not on file  Occupational History   Not on file  Tobacco Use   Smoking status: Former    Current packs/day: 0.00    Types: Cigarettes    Start date: 10/12/1966    Quit date: 10/13/1991    Years since quitting: 32.2   Smokeless tobacco: Never  Vaping Use   Vaping status: Never Used  Substance and Sexual Activity   Alcohol use: No    Comment: occassional glass of wine   Drug use: No   Sexual activity: Not Currently  Other Topics Concern   Not on file  Social History Narrative   2 daughters.    Wife is currently undergoing cancer treatments.    Social Drivers of Corporate investment banker Strain: Low Risk  (12/11/2022)   Overall Financial Resource Strain (CARDIA)    Difficulty of Paying Living Expenses: Not hard at all  Food Insecurity: No Food Insecurity (12/11/2022)   Hunger Vital Sign    Worried About Running Out of Food in the Last Year: Never true    Ran Out of Food in the Last Year: Never true  Transportation Needs: No Transportation Needs (12/11/2022)   PRAPARE - Administrator, Civil Service (Medical): No    Lack of Transportation (Non-Medical): No  Physical Activity: Insufficiently Active (12/11/2022)   Exercise Vital Sign    Days of Exercise per Week: 4 days    Minutes of Exercise per Session: 20 min  Stress: Stress Concern Present (12/11/2022)   [employer] of Occupational Health - Occupational  Stress Questionnaire    Feeling of Stress : To some extent  Social Connections: Moderately Integrated (12/11/2022)   Social Connection and Isolation Panel [NHANES]    Frequency of Communication with Friends and Family: More than three times a week    Frequency of Social Gatherings with Friends and Family: More than three times a week    Attends Religious Services: 1 to 4 times per year    Active Member of [employer] or Organizations: No    Attends Banker Meetings: 1 to 4 times per year    Marital Status: Widowed  Intimate Partner Violence: Not At Risk (12/11/2022)   Humiliation, Afraid, Rape, and Kick questionnaire    Fear of Current or Ex-Partner: No    Emotionally Abused: No    Physically Abused: No    Sexually Abused: No    Allergies  Allergen Reactions   Tape Other (See Comments)    PLEASE USE EASY-RELEASE  TAPE!!!! THE SKIN IS VERY SENSITIVE!!   Losartan Nausea Only and Other (See Comments)    Severe nausea    Current Outpatient Medications  Medication Sig Dispense Refill   atenolol (TENORMIN) 50 MG tablet Take 1 tablet (50 mg total) by mouth daily. 90 tablet 1   atorvastatin (LIPITOR) 40 MG tablet Take 1 tablet (40 mg total) by mouth daily. 90 tablet 3   clopidogrel (PLAVIX) 75 MG tablet TAKE 1 TABLET BY MOUTH EVERY DAY 90 tablet 0   clopidogrel (PLAVIX) 75 MG tablet Take 1 tablet (75 mg total) by mouth daily. 30 tablet 6   dapagliflozin propanediol (FARXIGA) 10 MG TABS tablet Take 1 tablet (10 mg total) by mouth daily before breakfast. 90 tablet 3   glipiZIDE (GLUCOTROL XL) 10 MG 24 hr tablet TAKE 1 TABLET BY MOUTH EVERY DAY WITH BREAKFAST 90 tablet 0   Glucose Blood (BLOOD GLUCOSE TEST STRIPS) STRP Please dispense as One touch Ultra Blue. Use as directed to monitor FSBS 3x daily. Dx: E11.9. 100 each 4   insulin glargine (LANTUS SOLOSTAR) 100 UNIT/ML Solostar Pen Inject 15 Units into the skin daily. 15 mL PRN   Insulin Pen Needle (BD PEN NEEDLE NANO 2ND GEN) 32G X 4 MM  MISC Use to administer insulin daily. 100 each 1   linaclotide (LINZESS) 290 MCG CAPS capsule Take 1 capsule (290 mcg total) by mouth daily before breakfast. 90 capsule 3   traZODone (DESYREL) 50 MG tablet Take 1 tablet (50 mg total) by mouth at bedtime. 90 tablet 3   No current facility-administered medications for this visit.    PHYSICAL EXAM Vitals:   01/04/24 1449  BP: 114/69  Pulse: 74  Temp: 97.7 F (36.5 C)  SpO2: 95%   Well-appearing man in no distress Regular rate and rhythm Unlabored breathing In a wheelchair  PERTINENT LABORATORY AND RADIOLOGIC DATA  Most recent CBC    Latest Ref Rng & Units 12/21/2023    2:20 PM 11/01/2023   10:24 AM 05/06/2023    2:22 PM  CBC  WBC 3.8 - 10.8 Thousand/uL 8.4  5.3  9.9   Hemoglobin 13.2 - 17.1 g/dL 16.1  09.6  04.5   Hematocrit 38.5 - 50.0 % 46.7  40.8  51.1   Platelets 140 - 400 Thousand/uL 215  257  167      Most recent CMP    Latest Ref Rng & Units 12/21/2023    2:20 PM 11/01/2023   10:24 AM 05/18/2023    9:19 AM  CMP  Glucose 65 - 99 mg/dL 409  811  914   BUN 7 - 25 mg/dL 52  27  38   Creatinine 0.70 - 1.22 mg/dL 7.82  9.56  2.13   Sodium 135 - 146 mmol/L 138  137  142   Potassium 3.5 - 5.3 mmol/L 5.1  5.6  5.0   Chloride 98 - 110 mmol/L 108  106  109   CO2 20 - 32 mmol/L 21  21  24    Calcium 8.6 - 10.3 mg/dL 9.5  9.6  9.3   Total Protein 6.1 - 8.1 g/dL 6.5  6.5  6.4   Total Bilirubin 0.2 - 1.2 mg/dL 0.8  1.3  1.1   AST 10 - 35 U/L 13  12  10    ALT 9 - 46 U/L 17  15  11      Renal function CrCl cannot be calculated (Unknown ideal weight.).  Hgb A1c MFr Bld (% of  total Hgb)  Date Value  11/01/2023 6.4 (H)    LDL Cholesterol (Calc)  Date Value Ref Range Status  05/06/2023 113 (H) mg/dL (calc) Final    Comment:    Reference range: <100 . Desirable range <100 mg/dL for primary prevention;   <70 mg/dL for patients with CHD or diabetic patients  with > or = 2 CHD risk factors. [name] Kitchen LDL-C is now calculated using  the Martin-Hopkins  calculation, which is a validated novel method providing  better accuracy than the Friedewald equation in the  estimation of LDL-C.  Horald Pollen et al. Lenox Ahr. 1914;782(95): 2061-2068  (http://education.[facility].com/faq/FAQ164)      Vascular Imaging: Right Carotid: Patent right CCA to ICA bypass graft without evidence of                 stenosis.   Left Carotid: Velocities in the left ICA are consistent with a 80-99%  stenosis.   Vertebrals: Bilateral vertebral arteries demonstrate antegrade flow.  Subclavians: Right subclavian artery flow was disturbed. Normal flow               hemodynamics were seen in the left subclavian artery.   Rande Brunt. Lenell Antu, MD [facility] Phone Number: [phone] 01/04/2024 4:24 PM  Total time spent on preparing this encounter including chart review, data review, collecting history, examining the patient, coordinating care for this established patient, 40 minutes.  Portions of this report may have been transcribed using voice recognition software.  Every effort has been made to ensure accuracy; however, inadvertent computerized transcription errors may still be present.

## 2024-01-04 ENCOUNTER — Ambulatory Visit: Payer: PPO | Admitting: Vascular Surgery

## 2024-01-04 ENCOUNTER — Encounter: Payer: Self-pay | Admitting: Vascular Surgery

## 2024-01-04 ENCOUNTER — Ambulatory Visit (HOSPITAL_COMMUNITY)
Admission: RE | Admit: 2024-01-04 | Discharge: 2024-01-04 | Disposition: A | Discharge status: Home / Self Care | Payer: PPO | Source: Ambulatory Visit | Attending: Vascular Surgery | Admitting: Vascular Surgery

## 2024-01-04 VITALS — BP 114/69 | HR 74 | Temp 97.7°F

## 2024-01-04 DIAGNOSIS — I6523 Occlusion and stenosis of bilateral carotid arteries: Secondary | ICD-10-CM | POA: Insufficient documentation

## 2024-01-04 MED ORDER — CLOPIDOGREL BISULFATE 75 MG PO TABS: 75.0000 mg | ORAL_TABLET | Freq: Every day | ORAL | 6 refills | Status: DC

## 2024-02-01 ENCOUNTER — Encounter: Payer: Self-pay | Admitting: Family Medicine

## 2024-02-03 ENCOUNTER — Other Ambulatory Visit: Payer: Self-pay

## 2024-02-03 ENCOUNTER — Telehealth: Payer: Self-pay

## 2024-02-03 DIAGNOSIS — E118 Type 2 diabetes mellitus with unspecified complications: Secondary | ICD-10-CM

## 2024-02-03 MED ORDER — DAPAGLIFLOZIN PROPANEDIOL 10 MG PO TABS: 10.0000 mg | ORAL_TABLET | Freq: Every day | ORAL | 3 refills | Status: DC

## 2024-02-03 MED ORDER — DAPAGLIFLOZIN PROPANEDIOL 10 MG PO TABS: 10.0000 mg | ORAL_TABLET | Freq: Every day | ORAL | 3 refills | Status: AC

## 2024-02-03 NOTE — Telephone Encounter (Signed)
 Copied from CRM [id number]. Topic: Clinical - Prescription Issue >> Feb 03, 2024 11:48 AM Elle L wrote: Reason for CRM: Pharmacist with [facility] was calling for clarification on the patient's dapagliflozin  propanediol (FARXIGA ) 10 MG TABS tablet prescription. He states Dr. Monty App name was written along with an ineligible name and they need verification that this was approved by Dr. Cheril Cork or a new prescription. Their call back number is [phone]. Reference number: [id number].

## 2024-03-11 ENCOUNTER — Encounter: Payer: Self-pay | Admitting: Family Medicine

## 2024-03-12 ENCOUNTER — Other Ambulatory Visit: Payer: Self-pay | Admitting: Family Medicine

## 2024-03-13 ENCOUNTER — Other Ambulatory Visit: Payer: Self-pay

## 2024-03-13 MED ORDER — ATENOLOL 50 MG PO TABS: 50.0000 mg | ORAL_TABLET | Freq: Every day | ORAL | 1 refills | Status: DC

## 2024-03-13 NOTE — Telephone Encounter (Signed)
 Medication refilled on a 90 day supply w/ 1 refill. Sent to [facility] [address], [place] at [address]. Request.

## 2024-05-14 ENCOUNTER — Other Ambulatory Visit: Payer: Self-pay | Admitting: Family Medicine

## 2024-05-15 ENCOUNTER — Other Ambulatory Visit: Payer: Self-pay

## 2024-05-15 ENCOUNTER — Encounter: Payer: Self-pay | Admitting: Family Medicine

## 2024-05-15 MED ORDER — LANTUS SOLOSTAR 100 UNIT/ML ~~LOC~~ SOPN: 15.0000 [IU] | PEN_INJECTOR | Freq: Every day | SUBCUTANEOUS | 99 refills | Status: AC

## 2024-06-08 ENCOUNTER — Other Ambulatory Visit: Payer: Self-pay | Admitting: Family Medicine

## 2024-07-03 ENCOUNTER — Telehealth: Payer: Self-pay

## 2024-07-03 NOTE — Telephone Encounter (Signed)
 Incoming fax from [facility] for documentation regarding patient assistance program. Notes faxed to [phone]. Mjp,lpn

## 2024-07-05 ENCOUNTER — Ambulatory Visit

## 2024-07-05 VITALS — Ht 63.0 in | Wt 162.0 lb

## 2024-07-05 DIAGNOSIS — Z0000 Encounter for general adult medical examination without abnormal findings: Secondary | ICD-10-CM

## 2024-07-05 NOTE — Progress Notes (Signed)
 Subjective:   [name] is a 89 y.o. who presents for a Medicare Wellness preventive visit.  As a reminder, Annual Wellness Visits don't include a physical exam, and some assessments may be limited, especially if this visit is performed virtually. We may recommend an in-person follow-up visit with your provider if needed.  Visit Complete: Virtual I connected with  [name] on 07/05/24 by a audio enabled telemedicine application and verified that I am speaking with the correct person using two identifiers.  Patient Location: Home  Provider Location: Home Office  I discussed the limitations of evaluation and management by telemedicine. The patient expressed understanding and agreed to proceed.  Vital Signs: Because this visit was a virtual/telehealth visit, some criteria may be missing or patient reported. Any vitals not documented were not able to be obtained and vitals that have been documented are patient reported.  VideoDeclined- This patient declined Librarian, academic. Therefore the visit was completed with audio only.  Persons Participating in Visit: Patient assisted by daughter Luke .  AWV Questionnaire: No: Patient Medicare AWV questionnaire was not completed prior to this visit.  Cardiac Risk Factors include: advanced age (>74men, >59 women);diabetes mellitus;hypertension;male gender     Objective:    Today's Vitals   07/05/24 1511  Weight: 162 lb (73.5 kg)  Height: 5' 3 (1.6 m)   Body mass index is 28.7 kg/m.     07/05/2024    3:14 PM 12/11/2022    3:20 PM 07/19/2022    8:53 PM 11/21/2021   11:23 AM 10/31/2021   11:43 AM 04/22/2021    6:52 AM 04/08/2021    7:31 AM  Advanced Directives  Does Patient Have a Medical Advance Directive? No Yes No Yes Yes Yes Yes  Type of Air cabin crew of [address];Living will Living will;Healthcare Power of [employer] Power of  [employer] Power of [place];Living will  Does patient want to make changes to medical advance directive?     No - Patient declined No - Patient declined No - Patient declined  Copy of Healthcare Power of Attorney in Chart?    Yes - validated most recent copy scanned in chart (See row information) No - copy requested No - copy requested Yes - validated most recent copy scanned in chart (See row information)  Would patient like information on creating a medical advance directive? Yes (MAU/Ambulatory/Procedural Areas - Information given)  No - Patient declined        Current Medications (verified) Outpatient Encounter Medications as of 07/05/2024  Medication Sig   atenolol  (TENORMIN ) 50 MG tablet Take 1 tablet (50 mg total) by mouth daily.   atorvastatin  (LIPITOR) 40 MG tablet TAKE 1 TABLET BY MOUTH EVERY DAY   clopidogrel  (PLAVIX ) 75 MG tablet Take 1 tablet (75 mg total) by mouth daily.   dapagliflozin  propanediol (FARXIGA ) 10 MG TABS tablet Take 1 tablet (10 mg total) by mouth daily before breakfast.   glipiZIDE  (GLUCOTROL  XL) 10 MG 24 hr tablet TAKE 1 TABLET BY MOUTH EVERY DAY WITH BREAKFAST   Glucose Blood (BLOOD GLUCOSE TEST STRIPS) STRP Please dispense as One touch Ultra Blue. Use as directed to monitor FSBS 3x daily. Dx: E11.9.   insulin  glargine (LANTUS  SOLOSTAR) 100 UNIT/ML Solostar Pen Inject 15 Units into the skin daily.   Insulin  Pen Needle (BD PEN NEEDLE NANO 2ND GEN) 32G X 4 MM MISC Use to administer insulin  daily.   linaclotide  (  LINZESS ) 290 MCG CAPS capsule Take 1 capsule (290 mcg total) by mouth daily before breakfast.   traZODone  (DESYREL ) 50 MG tablet Take 1 tablet (50 mg total) by mouth at bedtime.   clopidogrel  (PLAVIX ) 75 MG tablet TAKE 1 TABLET BY MOUTH EVERY DAY   No facility-administered encounter medications on file as of 07/05/2024.    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   Past Surgical History:  Procedure Laterality Date   APPENDECTOMY     as a child   CAROTID ANGIOGRAM N/A 05/09/2013   Procedure: CAROTID ANGIOGRAM;  Surgeon: Gaile LELON New, MD;  Location: [facility] CATH LAB;  Service: Cardiovascular;  Laterality: N/A;   CAROTID ENDARTERECTOMY Right 2008    Re-do Mar 04, 2012   carotidendartectomy  2008   right side   CATARACT EXTRACTION W/PHACO Left 04/08/2021   Procedure: CATARACT EXTRACTION PHACO AND INTRAOCULAR LENS PLACEMENT (IOC) LEFT DIABETIC 15.64 01:28.9;  Surgeon: Jaye Fallow, MD;  Location: [facility];  Service: Ophthalmology;  Laterality: Left;   CATARACT EXTRACTION W/PHACO Right 04/22/2021   Procedure: CATARACT EXTRACTION PHACO AND INTRAOCULAR LENS PLACEMENT (IOC) RIGHT DIABETIC 12.52 01:16.4;  Surgeon: Jaye Fallow, MD;  Location: [facility];  Service: Ophthalmology;  Laterality: Right;   ENDARTERECTOMY  03/04/2012   Procedure: ENDARTERECTOMY CAROTID;  Surgeon: Krystal JULIANNA Doing, MD;  Location: [facility] OR;  Service: Vascular;  Laterality: Right;  Redo Right Carotid Endarterectomy with hemasheild patch angioplasty   ENDARTERECTOMY Right 06/26/2020   Procedure: REDO CAROTID ENDARTERECTOMY;  Surgeon: Doing Krystal JULIANNA, MD;  Location[facility];  Service: Vascular;  Laterality: Right;   WOUND EXPLORATION Right 06/26/2020   Procedure: EXPLORATION OF RIGHT NECK INCISION;  Surgeon: Doing Krystal JULIANNA, MD;  Location: [facility] OR;  Service: Vascular;  Laterality: Right;   Family History  Problem Relation Age of Onset   Leukemia Mother    Asthma Father    Anesthesia  problems Neg Hx    Hypotension Neg Hx    Malignant hyperthermia Neg Hx    Pseudochol deficiency Neg Hx    Colon cancer Neg Hx    Stomach cancer Neg Hx    Esophageal cancer Neg Hx    Pancreatic cancer Neg Hx    Social History   Socioeconomic History   Marital status: Single    Spouse name: Nadiene   Number of children: 2   Years of education: Not on file   Highest education level: Not on file  Occupational History   Not on file  Tobacco Use   Smoking status: Former    Current packs/day: 0.00    Types: Cigarettes    Start date: 10/12/1966    Quit date: 10/13/1991    Years since quitting: 32.7   Smokeless tobacco: Never  Vaping Use   Vaping status: Never Used  Substance and Sexual Activity   Alcohol use: No    Comment: occassional glass of wine  Drug use: No   Sexual activity: Not Currently  Other Topics Concern   Not on file  Social History Narrative   2 daughters.    Wife is currently undergoing cancer treatments.    Social Drivers of Corporate investment banker Strain: Low Risk  (07/05/2024)   Overall Financial Resource Strain (CARDIA)    Difficulty of Paying Living Expenses: Not hard at all  Food Insecurity: No Food Insecurity (07/05/2024)   Hunger Vital Sign    Worried About Running Out of Food in the Last Year: Never true    Ran Out of Food in the Last Year: Never true  Transportation Needs: No Transportation Needs (07/05/2024)   PRAPARE - Administrator, Civil Service (Medical): No    Lack of Transportation (Non-Medical): No  Physical Activity: Inactive (07/05/2024)   Exercise Vital Sign    Days of Exercise per Week: 0 days    Minutes of Exercise per Session: 0 min  Stress: No Stress Concern Present (07/05/2024)   [employer] of Occupational Health - Occupational Stress Questionnaire    Feeling of Stress: Not at all  Social Connections: Moderately Isolated (07/05/2024)   Social Connection and Isolation Panel    Frequency of Communication with  Friends and Family: More than three times a week    Frequency of Social Gatherings with Friends and Family: More than three times a week    Attends Religious Services: 1 to 4 times per year    Active Member of [employer] or Organizations: No    Attends Banker Meetings: Never    Marital Status: Widowed    Tobacco Counseling Counseling given: Not Answered    Clinical Intake:  Pre-visit preparation completed: Yes  Pain : No/denies pain     Diabetes: No  Lab Results  Component Value Date   HGBA1C 6.4 (H) 11/01/2023   HGBA1C 8.9 (H) 05/06/2023   HGBA1C 7.6 (H) 10/19/2022     How often do you need to have someone help you when you read instructions, pamphlets, or other written materials from your doctor or pharmacy?: 1 - Never  Interpreter Needed?: No  Information entered by :: Charmaine Bloodgood LPN   Activities of Daily Living     07/05/2024    3:12 PM  In your present state of health, do you have any difficulty performing the following activities:  Hearing? 1  Vision? 0  Difficulty concentrating or making decisions? 1  Walking or climbing stairs? 1  Dressing or bathing? 0  Doing errands, shopping? 1  Preparing Food and eating ? N  Using the Toilet? N  In the past six months, have you accidently leaked urine? N  Do you have problems with loss of bowel control? N  Managing your Medications? Y  Managing your Finances? Y  Housekeeping or managing your Housekeeping? Y    Patient Care Team: Duanne Butler DASEN, MD as PCP - General (Family Medicine)  I have updated your Care Teams any recent Medical Services you may have received from other providers in the past year.     Assessment:   This is a routine wellness examination for [place].  Hearing/Vision screen Hearing Screening - Comments:: Hard of hearing; has hearing aids but doesn't wear  Vision Screening - Comments:: No vision problems; will schedule routine eye exam    Goals Addressed              This Visit's Progress    Prevent falls  On track      Depression Screen     07/05/2024    3:14 PM 04/27/2023    4:45 PM 12/11/2022    3:23 PM 06/22/2022   12:08 PM 11/21/2021   11:20 AM 10/31/2021   11:43 AM 09/13/2014    8:00 AM  PHQ 2/9 Scores  PHQ - 2 Score 0 0 1 0 0 0 0    Fall Risk     07/05/2024    3:15 PM 04/27/2023    4:44 PM 06/22/2022   12:11 PM 11/21/2021   11:24 AM 10/31/2021   11:43 AM  Fall Risk   Falls in the past year? 0 1 1  0  Number falls in past yr: 0 0 1 0   Injury with Fall? 0 0 1 0   Risk for fall due to : Impaired mobility;Impaired balance/gait History of fall(s);Impaired balance/gait;Impaired mobility;Mental status change History of fall(s);Impaired balance/gait;Impaired mobility Impaired balance/gait   Follow up Falls prevention discussed;Education provided;Falls evaluation completed Education provided;Falls prevention discussed;Falls evaluation completed Falls prevention discussed  Falls prevention discussed       Data saved with a previous flowsheet row definition    MEDICARE RISK AT HOME:  Medicare Risk at Home Any stairs in or around the home?: No If so, are there any without handrails?: No Home free of loose throw rugs in walkways, pet beds, electrical cords, etc?: Yes Adequate lighting in your home to reduce risk of falls?: Yes Life alert?: No Use of a cane, walker or w/c?: No Grab bars in the bathroom?: Yes Shower chair or bench in shower?: Yes Elevated toilet seat or a handicapped toilet?: Yes  TIMED UP AND GO:  Was the test performed?  No  Cognitive Function: 6CIT completed        07/05/2024    3:15 PM 12/11/2022    3:17 PM 11/21/2021   11:26 AM  6CIT Screen  What Year? 4 points 4 points 0 points  What month? 3 points 0 points 0 points  What time? 0 points 0 points 0 points  Count back from 20 2 points 4 points 4 points  Months in reverse 4 points 4 points 4 points  Repeat phrase 8 points 10 points 10 points  Total Score 21 points  22 points 18 points    Immunizations Immunization History  Administered Date(s) Administered   Fluad Quad(high Dose 65+) 06/22/2019, 08/22/2022   INFLUENZA, HIGH DOSE SEASONAL PF 07/13/2018, 08/13/2020, 07/26/2023   Influenza,inj,Quad PF,6+ Mos 08/17/2014, 09/10/2016   PFIZER(Purple Top)SARS-COV-2 Vaccination 11/18/2019, 12/12/2019   Pfizer(Comirnaty)Fall Seasonal Vaccine 12 years and older 07/26/2023   Pneumococcal Conjugate-13 08/17/2014   Pneumococcal Polysaccharide-23 10/13/2003    Screening Tests Health Maintenance  Topic Date Due   Zoster Vaccines- Shingrix (1 of 2) Never done   OPHTHALMOLOGY EXAM  03/05/2022   FOOT EXAM  04/10/2023   HEMOGLOBIN A1C  04/30/2024   Influenza Vaccine  05/12/2024   COVID-19 Vaccine (4 - Pfizer risk 2024-25 season) 06/12/2024   DTaP/Tdap/Td (1 - Tdap) 10/31/2024 (Originally 01/21/1950)   Medicare Annual Wellness (AWV)  07/05/2025   Pneumococcal Vaccine: 50+ Years  Completed   HPV VACCINES  Aged Out   Meningococcal B Vaccine  Aged Out    Health Maintenance Items Addressed: Information provided on vaccine recommendations   Additional Screening:  Vision Screening: Recommended annual ophthalmology exams for early detection of glaucoma and other disorders of the eye. Is the patient up to date with their annual eye exam?  No  Who is the provider or what is the name of the office in which the patient attends annual eye exams?   Dental Screening: Recommended annual dental exams for proper oral hygiene  Community Resource Referral / Chronic Care Management: CRR required this visit?  No   CCM required this visit?  No   Plan:    I have personally reviewed and noted the following in the patient's chart:   Medical and social history Use of alcohol, tobacco or illicit drugs  Current medications and supplements including opioid prescriptions. Patient is not currently taking opioid prescriptions. Functional ability and status Nutritional  status Physical activity Advanced directives List of other physicians Hospitalizations, surgeries, and ER visits in previous 12 months Vitals Screenings to include cognitive, depression, and falls Referrals and appointments  In addition, I have reviewed and discussed with patient certain preventive protocols, quality metrics, and best practice recommendations. A written personalized care plan for preventive services as well as general preventive health recommendations were provided to patient.   Lavelle Pfeiffer [place], [place]   [id number]   After Visit Summary: (MyChart) Due to this being a telephonic visit, the after visit summary with patients personalized plan was offered to patient via MyChart   Notes: Nothing significant to report at this time.

## 2024-07-05 NOTE — Patient Instructions (Signed)
 Mr. Ledford,  Thank you for taking the time for your Medicare Wellness Visit. I appreciate your continued commitment to your health goals. Please review the care plan we discussed, and feel free to reach out if I can assist you further.  Medicare recommends these wellness visits once per year to help you and your care team stay ahead of potential health issues. These visits are designed to focus on prevention, allowing your provider to concentrate on managing your acute and chronic conditions during your regular appointments.  Please note that Annual Wellness Visits do not include a physical exam. Some assessments may be limited, especially if the visit was conducted virtually. If needed, we may recommend a separate in-person follow-up with your provider.  Ongoing Care Seeing your primary care provider every 3 to 6 months helps us  monitor your health and provide consistent, personalized care.   Referrals If a referral was made during today's visit and you haven't received any updates within two weeks, please contact the referred provider directly to check on the status.  Recommended Screenings:  Health Maintenance  Topic Date Due   Zoster (Shingles) Vaccine (1 of 2) Never done   Eye exam for diabetics  03/05/2022   Complete foot exam   04/10/2023   Hemoglobin A1C  04/30/2024   Flu Shot  05/12/2024   COVID-19 Vaccine (4 - Pfizer risk 2024-25 season) 06/12/2024   DTaP/Tdap/Td vaccine (1 - Tdap) 10/31/2024*   Medicare Annual Wellness Visit  07/05/2025   Pneumococcal Vaccine for age over 33  Completed   HPV Vaccine  Aged Out   Meningitis B Vaccine  Aged Out  *Topic was postponed. The date shown is not the original due date.       07/05/2024    3:14 PM  Advanced Directives  Does Patient Have a Medical Advance Directive? No  Would patient like information on creating a medical advance directive? Yes (MAU/Ambulatory/Procedural Areas - Information given)   Advance Care Planning is  important because it: Ensures you receive medical care that aligns with your values, goals, and preferences. Provides guidance to your family and loved ones, reducing the emotional burden of decision-making during critical moments.  Information on Advanced Care Planning can be found at [place]  Secretary of [facility] Advance Health Care Directives Advance Health Care Directives ([email or web address]   Vision: Annual vision screenings are recommended for early detection of glaucoma, cataracts, and diabetic retinopathy. These exams can also reveal signs of chronic conditions such as diabetes and high blood pressure.  Dental: Annual dental screenings help detect early signs of oral cancer, gum disease, and other conditions linked to overall health, including heart disease and diabetes.  Please see the attached documents for additional preventive care recommendations.

## 2024-08-16 ENCOUNTER — Encounter: Payer: Self-pay | Admitting: Family Medicine

## 2024-08-18 ENCOUNTER — Ambulatory Visit

## 2024-08-18 DIAGNOSIS — Z23 Encounter for immunization: Secondary | ICD-10-CM | POA: Diagnosis not present

## 2024-08-18 NOTE — Progress Notes (Signed)
 Patient is in office today for a nurse visit for High Dose Fluzone  Immunization. Patient Injection was given in the  Right deltoid. Patient tolerated injection well.

## 2024-08-29 ENCOUNTER — Encounter: Payer: Self-pay | Admitting: Family Medicine

## 2024-08-29 ENCOUNTER — Other Ambulatory Visit: Payer: Self-pay

## 2024-08-29 DIAGNOSIS — E118 Type 2 diabetes mellitus with unspecified complications: Secondary | ICD-10-CM

## 2024-08-29 MED ORDER — GLIPIZIDE ER 10 MG PO TB24: ORAL_TABLET | ORAL | 1 refills | Status: DC

## 2024-08-31 ENCOUNTER — Ambulatory Visit: Admitting: Family Medicine

## 2024-08-31 ENCOUNTER — Encounter: Payer: Self-pay | Admitting: Family Medicine

## 2024-08-31 VITALS — BP 144/80 | HR 70 | Ht 63.0 in | Wt 170.6 lb

## 2024-08-31 DIAGNOSIS — I6523 Occlusion and stenosis of bilateral carotid arteries: Secondary | ICD-10-CM

## 2024-08-31 DIAGNOSIS — E118 Type 2 diabetes mellitus with unspecified complications: Secondary | ICD-10-CM

## 2024-08-31 DIAGNOSIS — Z794 Long term (current) use of insulin: Secondary | ICD-10-CM | POA: Diagnosis not present

## 2024-08-31 DIAGNOSIS — N1832 Chronic kidney disease, stage 3b: Secondary | ICD-10-CM

## 2024-08-31 DIAGNOSIS — E1122 Type 2 diabetes mellitus with diabetic chronic kidney disease: Secondary | ICD-10-CM | POA: Diagnosis not present

## 2024-08-31 DIAGNOSIS — I639 Cerebral infarction, unspecified: Secondary | ICD-10-CM

## 2024-08-31 NOTE — Progress Notes (Signed)
 Wt Readings from Last 3 Encounters:  08/31/24 170 lb 9.6 oz (77.4 kg)  07/05/24 162 lb (73.5 kg)  12/21/23 162 lb 8 oz (73.7 kg)     Subjective:    Patient ID: [name], male    DOB: October 19, 1930, 89 y.o.   MRN: [id number] Patient is a 89 year old Caucasian gentleman with a history of diabetes mellitus poorly controlled due to noncompliance, previous history of stroke and carotid artery disease who presents today for his regular follow-up.  He is already had his flu shot.  His pneumonia and shingles shots are up-to-date.  He is due for an RSV vaccine and a COVID shot.  We discussed this and the daughter states that she would like to get this at her pharmacy.  When I last saw the patient, we were concerned about weight loss.  Thankfully he has gained 8 pounds.  Daughter states that he is eating good.  He does get mildly confused at night.  However, there is no dangerous confusion.  Daughter states that his fasting blood sugars in the morning are 0100-0130.  She states that his blood sugars stay under 200 in the evening.  He denies any chest pain or shortness of breath or dyspnea on exertion. Past Medical History:  Diagnosis Date   Arthritis    knee   Carotid artery occlusion    Diabetes mellitus    metformin ,januvia ,and glipizide  daily   Enlarged prostate    pt states no trouble with it   History of COVID-19 08/2020   Hyperlipidemia    takes Simvastatin  daily   Hypertension    takes Atenolol  and Lisinopril  daily   Impaired hearing, left    but doesn't wear hearing aids   Seasonal allergies    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   Past Surgical History:  Procedure Laterality Date   APPENDECTOMY     as a child   CAROTID ANGIOGRAM N/A 05/09/2013   Procedure: CAROTID ANGIOGRAM;  Surgeon: Gaile LELON New, MD;  Location: [facility] CATH LAB;  Service: Cardiovascular;  Laterality: N/A;   CAROTID ENDARTERECTOMY Right 2008    Re-do Mar 04, 2012   carotidendartectomy  2008    right side   CATARACT EXTRACTION W/PHACO Left 04/08/2021   Procedure: CATARACT EXTRACTION PHACO AND INTRAOCULAR LENS PLACEMENT (IOC) LEFT DIABETIC 15.64 01:28.9;  Surgeon: Jaye Fallow, MD;  Location: [facility];  Service: Ophthalmology;  Laterality: Left;   CATARACT EXTRACTION W/PHACO Right 04/22/2021   Procedure: CATARACT EXTRACTION PHACO AND INTRAOCULAR LENS PLACEMENT (IOC) RIGHT DIABETIC 12.52 01:16.4;  Surgeon: Jaye Fallow, MD;  Location: [facility];  Service: Ophthalmology;  Laterality: Right;   ENDARTERECTOMY  03/04/2012   Procedure: ENDARTERECTOMY CAROTID;  Surgeon: Krystal JULIANNA Doing, MD;  Location: [facility] OR;  Service: Vascular;  Laterality: Right;  Redo Right Carotid Endarterectomy with hemasheild patch angioplasty   ENDARTERECTOMY Right 06/26/2020   Procedure: REDO CAROTID ENDARTERECTOMY;  Surgeon: Doing Krystal JULIANNA, MD;  Location: [facility] OR;  Service: Vascular;  Laterality: Right;   WOUND EXPLORATION Right 06/26/2020   Procedure: EXPLORATION OF RIGHT NECK INCISION;  Surgeon: Doing Krystal JULIANNA, MD;  Location: [facility] OR;  Service: Vascular;  Laterality: Right;   Current Outpatient Medications on File Prior to Visit  Medication Sig Dispense Refill   atenolol  (TENORMIN ) 50 MG tablet Take 1 tablet (50 mg total) by mouth daily. 90 tablet 1   atorvastatin  (LIPITOR) 40 MG tablet TAKE 1 TABLET BY MOUTH EVERY DAY 90 tablet 3  clopidogrel  (PLAVIX ) 75 MG tablet TAKE 1 TABLET BY MOUTH EVERY DAY 90 tablet 0   clopidogrel  (PLAVIX ) 75 MG tablet Take 1 tablet (75 mg total) by mouth daily. 30 tablet 6   dapagliflozin  propanediol (FARXIGA ) 10 MG TABS tablet Take 1 tablet (10 mg total) by mouth daily before breakfast. 90 tablet 3   glipiZIDE  (GLUCOTROL  XL) 10 MG 24 hr tablet TAKE 1 TABLET BY MOUTH EVERY DAY WITH BREAKFAST 90 tablet 1   Glucose Blood (BLOOD GLUCOSE TEST STRIPS) STRP Please dispense as One touch Ultra Blue. Use as directed to monitor FSBS 3x daily. Dx: E11.9. 100 each 4   insulin  glargine  (LANTUS  SOLOSTAR) 100 UNIT/ML Solostar Pen Inject 15 Units into the skin daily. 15 mL PRN   Insulin  Pen Needle (BD PEN NEEDLE NANO 2ND GEN) 32G X 4 MM MISC Use to administer insulin  daily. 100 each 1   linaclotide  (LINZESS ) 290 MCG CAPS capsule Take 1 capsule (290 mcg total) by mouth daily before breakfast. 90 capsule 3   traZODone  (DESYREL ) 50 MG tablet Take 1 tablet (50 mg total) by mouth at bedtime. 90 tablet 3   No current facility-administered medications on file prior to visit.   Allergies  Allergen Reactions   Tape Other (See Comments)    PLEASE USE EASY-RELEASE TAPE!!!! THE SKIN IS VERY SENSITIVE!!   Losartan  Nausea Only and Other (See Comments)    Severe nausea   Social History   Socioeconomic History   Marital status: Single    Spouse name: Nadiene   Number of children: 2   Years of education: Not on file   Highest education level: Not on file  Occupational History   Not on file  Tobacco Use   Smoking status: Former    Current packs/day: 0.00    Types: Cigarettes    Start date: 10/12/1966    Quit date: 10/13/1991    Years since quitting: 32.9   Smokeless tobacco: Never  Vaping Use   Vaping status: Never Used  Substance and Sexual Activity   Alcohol use: No    Comment: occassional glass of wine   Drug use: No   Sexual activity: Not Currently  Other Topics Concern   Not on file  Social History Narrative   2 daughters.    Wife is currently undergoing cancer treatments.    Social Drivers of Corporate Investment Banker Strain: Low Risk  (07/05/2024)   Overall Financial Resource Strain (CARDIA)    Difficulty of Paying Living Expenses: Not hard at all  Food Insecurity: No Food Insecurity (07/05/2024)   Hunger Vital Sign    Worried About Running Out of Food in the Last Year: Never true    Ran Out of Food in the Last Year: Never true  Transportation Needs: No Transportation Needs (07/05/2024)   PRAPARE - Administrator, Civil Service (Medical): No    Lack  of Transportation (Non-Medical): No  Physical Activity: Inactive (07/05/2024)   Exercise Vital Sign    Days of Exercise per Week: 0 days    Minutes of Exercise per Session: 0 min  Stress: No Stress Concern Present (07/05/2024)   [employer] of Occupational Health - Occupational Stress Questionnaire    Feeling of Stress: Not at all  Social Connections: Moderately Isolated (07/05/2024)   Social Connection and Isolation Panel    Frequency of Communication with Friends and Family: More than three times a week    Frequency of Social Gatherings with Friends and  Family: More than three times a week    Attends Religious Services: 1 to 4 times per year    Active Member of Clubs or Organizations: No    Attends Banker Meetings: Never    Marital Status: Widowed  Intimate Partner Violence: Not At Risk (07/05/2024)   Humiliation, Afraid, Rape, and Kick questionnaire    Fear of Current or Ex-Partner: No    Emotionally Abused: No    Physically Abused: No    Sexually Abused: No      Review of Systems  All other systems reviewed and are negative.      Objective:   Physical Exam Vitals reviewed.  Constitutional:      General: He is not in acute distress.    Appearance: Normal appearance. He is well-developed and normal weight. He is not ill-appearing, toxic-appearing or diaphoretic.  Eyes:     General: Lids are normal.        Right eye: No foreign body.        Left eye: No foreign body.     Extraocular Movements: Extraocular movements intact.     Conjunctiva/sclera:     Right eye: Right conjunctiva is not injected. No chemosis or exudate.    Left eye: Left conjunctiva is not injected. No chemosis or exudate. Neck:     Thyroid : No thyromegaly.     Vascular: No JVD.  Cardiovascular:     Rate and Rhythm: Normal rate and regular rhythm.     Heart sounds: Normal heart sounds. No murmur heard.    No friction rub. No gallop.  Pulmonary:     Effort: Pulmonary effort is  normal. No respiratory distress.     Breath sounds: Normal breath sounds. No wheezing or rales.  Abdominal:     General: Bowel sounds are normal. There is no distension.     Palpations: Abdomen is soft. There is no mass.     Tenderness: There is no abdominal tenderness. There is no guarding or rebound.  Musculoskeletal:     Cervical back: Neck supple.     Right lower leg: No edema.     Left lower leg: No edema.  Lymphadenopathy:     Cervical: No cervical adenopathy.  Neurological:     General: No focal deficit present.     Mental Status: He is alert and oriented to person, place, and time. Mental status is at baseline.     Sensory: Sensation is intact.     Motor: No weakness, atrophy or abnormal muscle tone.     Coordination: Coordination is intact.           Assessment & Plan:  Bilateral carotid artery stenosis  Cerebrovascular accident (CVA), unspecified mechanism (HCC)  Controlled type 2 diabetes mellitus with complication, without long-term current use of insulin  (HCC) - Plan: CBC with Differential/Platelet, Comprehensive metabolic panel with GFR, Lipid panel, Hemoglobin A1c  Stage 3b chronic kidney disease (HCC) I am very happy to see his weight loss stabilized.  He seems to be eating well.  He seems well-nourished and happy at home.  He denies any concerns.  I did recommend the RSV vaccine and the COVID-vaccine given his age.  His blood pressure today is mildly elevated at 144/80.  However given his age, I believe that this is acceptable as I am worried about hypotension causing dizziness and falls.  His reported blood sugars sound outstanding.  I will monitor his kidney function.  I will also check a hemoglobin A1c.  I would be willing to except a hemoglobin A1c less than 7.5.  I would like to keep his LDL cholesterol less than 70.  Given his history of stroke, I want to keep his LDL cholesterol under 70 for that reason as well as his history of bilateral carotid artery  stenosis.  He is consistently taking his antiplatelet agent which is Plavix 

## 2024-09-01 ENCOUNTER — Ambulatory Visit: Payer: Self-pay | Admitting: Family Medicine

## 2024-09-01 ENCOUNTER — Other Ambulatory Visit: Payer: Self-pay

## 2024-09-01 DIAGNOSIS — N1832 Chronic kidney disease, stage 3b: Secondary | ICD-10-CM

## 2024-09-01 DIAGNOSIS — E118 Type 2 diabetes mellitus with unspecified complications: Secondary | ICD-10-CM

## 2024-09-01 LAB — COMPREHENSIVE METABOLIC PANEL WITH GFR
AG Ratio: 1.9 (calc) (ref 1.0–2.5)
ALT: 15 U/L (ref 9–46)
AST: 14 U/L (ref 10–35)
Albumin: 4 g/dL (ref 3.6–5.1)
Alkaline phosphatase (APISO): 96 U/L (ref 35–144)
BUN/Creatinine Ratio: 20 (calc) (ref 6–22)
BUN: 37 mg/dL — ABNORMAL HIGH (ref 7–25)
CO2: 29 mmol/L (ref 20–32)
Calcium: 9.2 mg/dL (ref 8.6–10.3)
Chloride: 104 mmol/L (ref 98–110)
Creat: 1.83 mg/dL — ABNORMAL HIGH (ref 0.70–1.22)
Globulin: 2.1 g/dL (ref 1.9–3.7)
Glucose, Bld: 206 mg/dL — ABNORMAL HIGH (ref 65–99)
Potassium: 4.7 mmol/L (ref 3.5–5.3)
Sodium: 138 mmol/L (ref 135–146)
Total Bilirubin: 0.8 mg/dL (ref 0.2–1.2)
Total Protein: 6.1 g/dL (ref 6.1–8.1)
eGFR: 34 mL/min/1.73m2 — ABNORMAL LOW (ref >=60)

## 2024-09-01 LAB — CBC WITH DIFFERENTIAL/PLATELET
Absolute Lymphocytes: 2876 {cells}/uL (ref 850–3900)
Absolute Monocytes: 964 {cells}/uL — ABNORMAL HIGH (ref 200–950)
Basophils Absolute: 41 {cells}/uL (ref 0–200)
Basophils Relative: 0.5 %
Eosinophils Absolute: 267 {cells}/uL (ref 15–500)
Eosinophils Relative: 3.3 %
HCT: 48 % (ref 38.5–50.0)
Hemoglobin: 15.1 g/dL (ref 13.2–17.1)
MCH: 29.7 pg (ref 27.0–33.0)
MCHC: 31.5 g/dL — ABNORMAL LOW (ref 32.0–36.0)
MCV: 94.5 fL (ref 80.0–100.0)
MPV: 9.8 fL (ref 7.5–12.5)
Monocytes Relative: 11.9 %
Neutro Abs: 3953 {cells}/uL (ref 1500–7800)
Neutrophils Relative %: 48.8 %
Platelets: 180 Thousand/uL (ref 140–400)
RBC: 5.08 Million/uL (ref 4.20–5.80)
RDW: 13.4 % (ref 11.0–15.0)
Total Lymphocyte: 35.5 %
WBC: 8.1 Thousand/uL (ref 3.8–10.8)

## 2024-09-01 LAB — LIPID PANEL
Cholesterol: 90 mg/dL (ref <200)
HDL: 31 mg/dL — ABNORMAL LOW (ref >=40)
LDL Cholesterol (Calc): 31 mg/dL
Non-HDL Cholesterol (Calc): 59 mg/dL (ref <130)
Total CHOL/HDL Ratio: 2.9 (calc) (ref <5.0)
Triglycerides: 212 mg/dL — ABNORMAL HIGH (ref <150)

## 2024-09-01 LAB — HEMOGLOBIN A1C
Hgb A1c MFr Bld: 8.4 % — ABNORMAL HIGH (ref <5.7)
Mean Plasma Glucose: 194 mg/dL
eAG (mmol/L): 10.8 mmol/L

## 2024-09-01 MED ORDER — PIOGLITAZONE HCL 30 MG PO TABS: 30.0000 mg | ORAL_TABLET | Freq: Every day | ORAL | 3 refills | Status: AC

## 2024-09-02 ENCOUNTER — Other Ambulatory Visit: Payer: Self-pay | Admitting: Family Medicine

## 2024-09-04 ENCOUNTER — Other Ambulatory Visit: Payer: Self-pay

## 2024-09-04 NOTE — Telephone Encounter (Signed)
 Requested Prescriptions  Pending Prescriptions Disp Refills   atenolol  (TENORMIN ) 50 MG tablet [Pharmacy Med Name: ATENOLOL  50 MG TABLET] 90 tablet 1    Sig: TAKE 1 TABLET BY MOUTH EVERY DAY     Cardiovascular: Beta Blockers 2 Failed - 09/04/2024  2:18 PM      Failed - Cr in normal range and within 360 days    Creat  Date Value Ref Range Status  08/31/2024 1.83 (H) 0.70 - 1.22 mg/dL Final         Failed - Last BP in normal range    BP Readings from Last 1 Encounters:  08/31/24 (!) 144/80         Passed - Last Heart Rate in normal range    Pulse Readings from Last 1 Encounters:  08/31/24 70         Passed - Valid encounter within last 6 months    Recent Outpatient Visits           4 days ago Bilateral carotid artery stenosis   [facility] Duanne Butler DASEN, MD   8 months ago Epistaxis   [place] [facility] Aletha Bene, MD   10 months ago Weight loss   [place] [facility] [name], Butler DASEN, MD   1 year ago Cerebrovascular accident (CVA), unspecified mechanism [facility])   [place] [facility] [name], Butler DASEN, MD   1 year ago Slurred speech   [place] [facility] [name], Butler DASEN, MD

## 2024-09-21 NOTE — Progress Notes (Signed)
° °  09/21/2024  Patient ID: [name], male   DOB: Dec 15, 1930, 89 y.o.   MRN: [id number]  Pharmacy Quality Measure Review  This patient is appearing on a report for being at risk of failing the adherence measure for cholesterol (statin) medications this calendar year.   Medication: atorvastatin  40mg  Last fill date: 09/11/2024 for 90 day supply  Insurance report was not up to date. No action needed at this time.   Lang Sieve, PharmD, BCGP Clinical Pharmacist  [phone]

## 2024-09-29 ENCOUNTER — Other Ambulatory Visit: Payer: Self-pay | Admitting: Vascular Surgery

## 2024-10-24 ENCOUNTER — Encounter: Payer: Self-pay | Admitting: Family Medicine

## 2024-10-30 ENCOUNTER — Encounter: Payer: Self-pay | Admitting: Family Medicine

## 2024-10-31 ENCOUNTER — Other Ambulatory Visit: Payer: Self-pay

## 2024-10-31 ENCOUNTER — Other Ambulatory Visit: Payer: Self-pay | Admitting: Family Medicine

## 2024-10-31 MED ORDER — ESCITALOPRAM OXALATE 10 MG PO TABS: 10.0000 mg | ORAL_TABLET | Freq: Every day | ORAL | 1 refills | Status: AC

## 2025-07-11 ENCOUNTER — Ambulatory Visit
